# Patient Record
Sex: Female | Born: 1960
Health system: Southern US, Community
[De-identification: ages and names within clinical notes are randomized; demographics above are authoritative.]

## PROBLEM LIST (undated history)

## (undated) DIAGNOSIS — D649 Anemia, unspecified: Secondary | ICD-10-CM

## (undated) DIAGNOSIS — G8929 Other chronic pain: Secondary | ICD-10-CM

## (undated) DIAGNOSIS — K219 Gastro-esophageal reflux disease without esophagitis: Secondary | ICD-10-CM

## (undated) DIAGNOSIS — R12 Heartburn: Secondary | ICD-10-CM

## (undated) DIAGNOSIS — Z8719 Personal history of other diseases of the digestive system: Secondary | ICD-10-CM

## (undated) DIAGNOSIS — E785 Hyperlipidemia, unspecified: Secondary | ICD-10-CM

## (undated) DIAGNOSIS — M25562 Pain in left knee: Secondary | ICD-10-CM

## (undated) DIAGNOSIS — F1010 Alcohol abuse, uncomplicated: Secondary | ICD-10-CM

## (undated) DIAGNOSIS — M25511 Pain in right shoulder: Secondary | ICD-10-CM

## (undated) DIAGNOSIS — G5601 Carpal tunnel syndrome, right upper limb: Secondary | ICD-10-CM

## (undated) DIAGNOSIS — M545 Low back pain, unspecified: Secondary | ICD-10-CM

## (undated) DIAGNOSIS — R51 Headache: Secondary | ICD-10-CM

## (undated) DIAGNOSIS — M1990 Unspecified osteoarthritis, unspecified site: Secondary | ICD-10-CM

## (undated) DIAGNOSIS — M25512 Pain in left shoulder: Secondary | ICD-10-CM

## (undated) DIAGNOSIS — I10 Essential (primary) hypertension: Secondary | ICD-10-CM

## (undated) HISTORY — DX: Essential (primary) hypertension: I10

## (undated) HISTORY — PX: ANTERIOR CRUCIATE LIGAMENT REPAIR: SHX115

## (undated) HISTORY — DX: Pain in right shoulder: M25.511

## (undated) HISTORY — DX: Pain in left knee: M25.562

## (undated) HISTORY — DX: Alcohol abuse, uncomplicated: F10.10

## (undated) HISTORY — DX: Carpal tunnel syndrome, right upper limb: G56.01

## (undated) HISTORY — DX: Anemia, unspecified: D64.9

## (undated) HISTORY — DX: Hyperlipidemia, unspecified: E78.5

## (undated) HISTORY — DX: Headache: R51

## (undated) HISTORY — PX: SHOULDER ARTHROSCOPY W/ ROTATOR CUFF REPAIR: SHX2400

## (undated) HISTORY — DX: Pain in left shoulder: M25.512

## (undated) HISTORY — DX: Other chronic pain: G89.29

## (undated) HISTORY — DX: Low back pain, unspecified: M54.50

## (undated) HISTORY — DX: Low back pain: M54.5

---

## 1987-02-24 HISTORY — PX: TUBAL LIGATION: SHX77

## 1997-10-07 ENCOUNTER — Emergency Department (HOSPITAL_COMMUNITY): Admission: EM | Admit: 1997-10-07 | Discharge: 1997-10-08 | Payer: Self-pay | Admitting: Emergency Medicine

## 1997-12-11 ENCOUNTER — Emergency Department (HOSPITAL_COMMUNITY): Admission: EM | Admit: 1997-12-11 | Discharge: 1997-12-11 | Payer: Self-pay | Admitting: Emergency Medicine

## 1998-06-11 ENCOUNTER — Encounter: Admission: RE | Admit: 1998-06-11 | Discharge: 1998-06-11 | Payer: Self-pay | Admitting: Hematology and Oncology

## 1998-08-02 ENCOUNTER — Encounter: Admission: RE | Admit: 1998-08-02 | Discharge: 1998-08-02 | Payer: Self-pay | Admitting: Internal Medicine

## 1998-11-01 ENCOUNTER — Encounter: Admission: RE | Admit: 1998-11-01 | Discharge: 1998-11-01 | Payer: Self-pay | Admitting: Internal Medicine

## 1999-04-25 ENCOUNTER — Encounter: Admission: RE | Admit: 1999-04-25 | Discharge: 1999-04-25 | Payer: Self-pay | Admitting: Hematology and Oncology

## 1999-04-25 ENCOUNTER — Other Ambulatory Visit: Admission: RE | Admit: 1999-04-25 | Discharge: 1999-04-25 | Payer: Self-pay | Admitting: Obstetrics & Gynecology

## 1999-07-22 ENCOUNTER — Inpatient Hospital Stay (HOSPITAL_COMMUNITY): Admission: AD | Admit: 1999-07-22 | Discharge: 1999-07-24 | Payer: Self-pay

## 1999-09-13 ENCOUNTER — Inpatient Hospital Stay (HOSPITAL_COMMUNITY): Admission: EM | Admit: 1999-09-13 | Discharge: 1999-09-16 | Payer: Self-pay | Admitting: Psychiatry

## 1999-09-26 ENCOUNTER — Encounter: Admission: RE | Admit: 1999-09-26 | Discharge: 1999-09-26 | Payer: Self-pay | Admitting: Internal Medicine

## 1999-09-30 ENCOUNTER — Encounter: Admission: RE | Admit: 1999-09-30 | Discharge: 1999-09-30 | Payer: Self-pay | Admitting: Internal Medicine

## 1999-10-03 ENCOUNTER — Encounter: Payer: Self-pay | Admitting: Internal Medicine

## 1999-10-03 ENCOUNTER — Ambulatory Visit (HOSPITAL_COMMUNITY): Admission: RE | Admit: 1999-10-03 | Discharge: 1999-10-03 | Payer: Self-pay | Admitting: Internal Medicine

## 1999-10-24 ENCOUNTER — Encounter: Admission: RE | Admit: 1999-10-24 | Discharge: 1999-10-24 | Payer: Self-pay | Admitting: Internal Medicine

## 1999-11-17 ENCOUNTER — Encounter: Admission: RE | Admit: 1999-11-17 | Discharge: 1999-11-17 | Payer: Self-pay | Admitting: Internal Medicine

## 1999-12-31 ENCOUNTER — Encounter: Admission: RE | Admit: 1999-12-31 | Discharge: 1999-12-31 | Payer: Self-pay | Admitting: Hematology and Oncology

## 2000-06-22 ENCOUNTER — Encounter: Admission: RE | Admit: 2000-06-22 | Discharge: 2000-06-22 | Payer: Self-pay | Admitting: Internal Medicine

## 2000-06-22 ENCOUNTER — Encounter: Payer: Self-pay | Admitting: Internal Medicine

## 2000-06-22 ENCOUNTER — Ambulatory Visit (HOSPITAL_COMMUNITY): Admission: RE | Admit: 2000-06-22 | Discharge: 2000-06-22 | Payer: Self-pay | Admitting: Internal Medicine

## 2000-07-13 ENCOUNTER — Encounter: Admission: RE | Admit: 2000-07-13 | Discharge: 2000-07-13 | Payer: Self-pay | Admitting: Internal Medicine

## 2000-07-28 ENCOUNTER — Encounter: Admission: RE | Admit: 2000-07-28 | Discharge: 2000-07-28 | Payer: Self-pay | Admitting: Internal Medicine

## 2000-12-08 ENCOUNTER — Encounter: Admission: RE | Admit: 2000-12-08 | Discharge: 2000-12-08 | Payer: Self-pay

## 2000-12-17 ENCOUNTER — Encounter: Admission: RE | Admit: 2000-12-17 | Discharge: 2000-12-17 | Payer: Self-pay | Admitting: Internal Medicine

## 2001-04-26 ENCOUNTER — Encounter: Admission: RE | Admit: 2001-04-26 | Discharge: 2001-04-26 | Payer: Self-pay | Admitting: Internal Medicine

## 2001-07-21 ENCOUNTER — Encounter: Admission: RE | Admit: 2001-07-21 | Discharge: 2001-07-21 | Payer: Self-pay | Admitting: Internal Medicine

## 2001-09-20 ENCOUNTER — Emergency Department (HOSPITAL_COMMUNITY): Admission: EM | Admit: 2001-09-20 | Discharge: 2001-09-20 | Payer: Self-pay | Admitting: Emergency Medicine

## 2001-09-26 ENCOUNTER — Encounter: Admission: RE | Admit: 2001-09-26 | Discharge: 2001-09-26 | Payer: Self-pay | Admitting: Internal Medicine

## 2001-10-12 ENCOUNTER — Encounter: Admission: RE | Admit: 2001-10-12 | Discharge: 2001-10-12 | Payer: Self-pay | Admitting: Internal Medicine

## 2001-10-18 ENCOUNTER — Ambulatory Visit (HOSPITAL_COMMUNITY): Admission: RE | Admit: 2001-10-18 | Discharge: 2001-10-18 | Payer: Self-pay | Admitting: Internal Medicine

## 2001-10-18 ENCOUNTER — Encounter: Payer: Self-pay | Admitting: Internal Medicine

## 2001-10-26 ENCOUNTER — Encounter: Admission: RE | Admit: 2001-10-26 | Discharge: 2001-10-26 | Payer: Self-pay | Admitting: Internal Medicine

## 2002-09-12 ENCOUNTER — Emergency Department (HOSPITAL_COMMUNITY): Admission: EM | Admit: 2002-09-12 | Discharge: 2002-09-12 | Payer: Self-pay

## 2002-12-07 ENCOUNTER — Encounter: Admission: RE | Admit: 2002-12-07 | Discharge: 2002-12-07 | Payer: Self-pay | Admitting: Internal Medicine

## 2003-04-02 ENCOUNTER — Encounter: Admission: RE | Admit: 2003-04-02 | Discharge: 2003-04-02 | Payer: Self-pay | Admitting: Internal Medicine

## 2003-06-06 ENCOUNTER — Encounter: Admission: RE | Admit: 2003-06-06 | Discharge: 2003-06-06 | Payer: Self-pay | Admitting: Internal Medicine

## 2004-02-16 ENCOUNTER — Emergency Department (HOSPITAL_COMMUNITY): Admission: EM | Admit: 2004-02-16 | Discharge: 2004-02-16 | Payer: Self-pay | Admitting: *Deleted

## 2004-02-21 ENCOUNTER — Ambulatory Visit (HOSPITAL_COMMUNITY): Admission: RE | Admit: 2004-02-21 | Discharge: 2004-02-21 | Payer: Self-pay | Admitting: Internal Medicine

## 2004-02-21 ENCOUNTER — Ambulatory Visit: Payer: Self-pay | Admitting: Internal Medicine

## 2004-03-25 ENCOUNTER — Ambulatory Visit: Payer: Self-pay | Admitting: Internal Medicine

## 2004-03-27 ENCOUNTER — Encounter: Admission: RE | Admit: 2004-03-27 | Discharge: 2004-06-25 | Payer: Self-pay | Admitting: Internal Medicine

## 2004-07-04 ENCOUNTER — Ambulatory Visit: Payer: Self-pay | Admitting: Internal Medicine

## 2004-07-09 ENCOUNTER — Ambulatory Visit (HOSPITAL_COMMUNITY): Admission: RE | Admit: 2004-07-09 | Discharge: 2004-07-09 | Payer: Self-pay | Admitting: Internal Medicine

## 2004-07-11 ENCOUNTER — Ambulatory Visit: Payer: Self-pay | Admitting: Internal Medicine

## 2004-09-26 ENCOUNTER — Emergency Department (HOSPITAL_COMMUNITY): Admission: EM | Admit: 2004-09-26 | Discharge: 2004-09-26 | Payer: Self-pay | Admitting: Emergency Medicine

## 2004-09-30 ENCOUNTER — Ambulatory Visit: Payer: Self-pay | Admitting: Internal Medicine

## 2004-10-12 ENCOUNTER — Ambulatory Visit (HOSPITAL_COMMUNITY): Admission: RE | Admit: 2004-10-12 | Discharge: 2004-10-12 | Payer: Self-pay | Admitting: Orthopedic Surgery

## 2004-10-15 ENCOUNTER — Ambulatory Visit: Payer: Self-pay | Admitting: Internal Medicine

## 2004-11-06 ENCOUNTER — Ambulatory Visit (HOSPITAL_COMMUNITY): Admission: RE | Admit: 2004-11-06 | Discharge: 2004-11-06 | Payer: Self-pay | Admitting: Internal Medicine

## 2004-11-08 ENCOUNTER — Emergency Department (HOSPITAL_COMMUNITY): Admission: EM | Admit: 2004-11-08 | Discharge: 2004-11-08 | Payer: Self-pay | Admitting: Emergency Medicine

## 2004-11-18 ENCOUNTER — Ambulatory Visit: Payer: Self-pay | Admitting: Internal Medicine

## 2004-11-18 ENCOUNTER — Ambulatory Visit (HOSPITAL_COMMUNITY): Admission: RE | Admit: 2004-11-18 | Discharge: 2004-11-18 | Payer: Self-pay | Admitting: Orthopedic Surgery

## 2004-12-24 ENCOUNTER — Ambulatory Visit: Payer: Self-pay | Admitting: Internal Medicine

## 2005-01-21 ENCOUNTER — Ambulatory Visit (HOSPITAL_BASED_OUTPATIENT_CLINIC_OR_DEPARTMENT_OTHER): Admission: RE | Admit: 2005-01-21 | Discharge: 2005-01-21 | Payer: Self-pay | Admitting: Orthopedic Surgery

## 2005-01-21 ENCOUNTER — Ambulatory Visit (HOSPITAL_COMMUNITY): Admission: RE | Admit: 2005-01-21 | Discharge: 2005-01-21 | Payer: Self-pay | Admitting: Orthopedic Surgery

## 2005-02-10 ENCOUNTER — Ambulatory Visit: Payer: Self-pay | Admitting: Internal Medicine

## 2005-02-10 ENCOUNTER — Encounter: Admission: RE | Admit: 2005-02-10 | Discharge: 2005-05-11 | Payer: Self-pay | Admitting: Orthopedic Surgery

## 2005-03-24 ENCOUNTER — Ambulatory Visit: Payer: Self-pay | Admitting: Hospitalist

## 2005-04-01 ENCOUNTER — Ambulatory Visit (HOSPITAL_BASED_OUTPATIENT_CLINIC_OR_DEPARTMENT_OTHER): Admission: RE | Admit: 2005-04-01 | Discharge: 2005-04-01 | Payer: Self-pay | Admitting: Orthopedic Surgery

## 2005-05-12 ENCOUNTER — Encounter: Admission: RE | Admit: 2005-05-12 | Discharge: 2005-06-23 | Payer: Self-pay | Admitting: Orthopedic Surgery

## 2005-06-05 ENCOUNTER — Ambulatory Visit: Payer: Self-pay | Admitting: Internal Medicine

## 2005-08-10 ENCOUNTER — Ambulatory Visit: Payer: Self-pay | Admitting: Internal Medicine

## 2005-09-22 ENCOUNTER — Ambulatory Visit: Payer: Self-pay | Admitting: Internal Medicine

## 2005-10-14 ENCOUNTER — Encounter: Admission: RE | Admit: 2005-10-14 | Discharge: 2005-10-30 | Payer: Self-pay | Admitting: Orthopedic Surgery

## 2005-10-22 ENCOUNTER — Ambulatory Visit: Payer: Self-pay | Admitting: Internal Medicine

## 2005-11-17 ENCOUNTER — Ambulatory Visit: Payer: Self-pay | Admitting: Internal Medicine

## 2005-11-20 ENCOUNTER — Encounter (INDEPENDENT_AMBULATORY_CARE_PROVIDER_SITE_OTHER): Payer: Self-pay | Admitting: Internal Medicine

## 2005-11-20 DIAGNOSIS — R51 Headache: Secondary | ICD-10-CM

## 2005-11-20 DIAGNOSIS — R519 Headache, unspecified: Secondary | ICD-10-CM | POA: Insufficient documentation

## 2005-11-20 DIAGNOSIS — G5600 Carpal tunnel syndrome, unspecified upper limb: Secondary | ICD-10-CM

## 2005-11-20 DIAGNOSIS — F1010 Alcohol abuse, uncomplicated: Secondary | ICD-10-CM

## 2005-11-20 DIAGNOSIS — N926 Irregular menstruation, unspecified: Secondary | ICD-10-CM | POA: Insufficient documentation

## 2005-11-20 DIAGNOSIS — N939 Abnormal uterine and vaginal bleeding, unspecified: Secondary | ICD-10-CM

## 2005-11-20 DIAGNOSIS — J309 Allergic rhinitis, unspecified: Secondary | ICD-10-CM | POA: Insufficient documentation

## 2005-11-27 ENCOUNTER — Ambulatory Visit (HOSPITAL_COMMUNITY): Admission: RE | Admit: 2005-11-27 | Discharge: 2005-11-27 | Payer: Self-pay | Admitting: Internal Medicine

## 2005-12-23 ENCOUNTER — Ambulatory Visit: Payer: Self-pay | Admitting: Internal Medicine

## 2005-12-23 ENCOUNTER — Encounter (INDEPENDENT_AMBULATORY_CARE_PROVIDER_SITE_OTHER): Payer: Self-pay | Admitting: Pulmonary Disease

## 2005-12-23 ENCOUNTER — Encounter (INDEPENDENT_AMBULATORY_CARE_PROVIDER_SITE_OTHER): Payer: Self-pay | Admitting: Internal Medicine

## 2005-12-23 LAB — CONVERTED CEMR LAB
Candida species: NEGATIVE
Gardnerella vaginalis: NEGATIVE
Pap Smear: NORMAL

## 2006-01-22 ENCOUNTER — Ambulatory Visit: Payer: Self-pay | Admitting: Internal Medicine

## 2006-02-07 DIAGNOSIS — M79609 Pain in unspecified limb: Secondary | ICD-10-CM

## 2006-03-02 DIAGNOSIS — E785 Hyperlipidemia, unspecified: Secondary | ICD-10-CM | POA: Insufficient documentation

## 2006-03-10 ENCOUNTER — Ambulatory Visit: Payer: Self-pay | Admitting: Internal Medicine

## 2006-03-10 ENCOUNTER — Ambulatory Visit (HOSPITAL_COMMUNITY): Admission: RE | Admit: 2006-03-10 | Discharge: 2006-03-10 | Payer: Self-pay | Admitting: Internal Medicine

## 2006-03-10 ENCOUNTER — Encounter (INDEPENDENT_AMBULATORY_CARE_PROVIDER_SITE_OTHER): Payer: Self-pay | Admitting: Internal Medicine

## 2006-03-10 LAB — CONVERTED CEMR LAB
Barbiturate Quant, Ur: NEGATIVE
Creatinine,U: 290.7 mg/dL
Methadone: NEGATIVE

## 2006-04-13 ENCOUNTER — Ambulatory Visit: Payer: Self-pay | Admitting: Internal Medicine

## 2006-04-13 DIAGNOSIS — J069 Acute upper respiratory infection, unspecified: Secondary | ICD-10-CM | POA: Insufficient documentation

## 2006-04-20 ENCOUNTER — Ambulatory Visit: Payer: Self-pay | Admitting: Internal Medicine

## 2006-04-20 ENCOUNTER — Encounter (INDEPENDENT_AMBULATORY_CARE_PROVIDER_SITE_OTHER): Payer: Self-pay | Admitting: Internal Medicine

## 2006-04-20 LAB — CONVERTED CEMR LAB
ALT: 27 units/L (ref 0–35)
AST: 31 units/L (ref 0–37)
Albumin: 4 g/dL (ref 3.5–5.2)
Alkaline Phosphatase: 55 units/L (ref 39–117)
Calcium: 8.6 mg/dL (ref 8.4–10.5)
Cholesterol: 139 mg/dL (ref 0–200)
Creatinine, Ser: 0.62 mg/dL (ref 0.40–1.20)
HCT: 37.4 % (ref 36.0–46.0)
HDL: 56 mg/dL (ref >39)
Hemoglobin: 12.1 g/dL (ref 12.0–15.0)
LDL Cholesterol: 74 mg/dL (ref 0–99)
MCV: 91.9 fL (ref 78.0–100.0)
RBC: 4.07 M/uL (ref 3.87–5.11)
RDW: 14.7 % — ABNORMAL HIGH (ref 11.5–14.0)
Total Protein: 6.9 g/dL (ref 6.0–8.3)
Triglycerides: 46 mg/dL (ref <150)
VLDL: 9 mg/dL (ref 0–40)
WBC: 4.4 10*3/uL (ref 4.0–10.5)

## 2006-04-27 ENCOUNTER — Encounter (INDEPENDENT_AMBULATORY_CARE_PROVIDER_SITE_OTHER): Payer: Self-pay | Admitting: Pulmonary Disease

## 2006-04-27 ENCOUNTER — Ambulatory Visit: Payer: Self-pay | Admitting: Hospitalist

## 2006-04-27 DIAGNOSIS — G8929 Other chronic pain: Secondary | ICD-10-CM

## 2006-04-27 LAB — CONVERTED CEMR LAB
Barbiturate Quant, Ur: NEGATIVE
Marijuana Metabolite: NEGATIVE
Methadone: NEGATIVE
Phencyclidine (PCP): NEGATIVE

## 2006-04-29 ENCOUNTER — Telehealth (INDEPENDENT_AMBULATORY_CARE_PROVIDER_SITE_OTHER): Payer: Self-pay | Admitting: Pulmonary Disease

## 2006-05-10 ENCOUNTER — Telehealth: Payer: Self-pay | Admitting: *Deleted

## 2006-05-17 ENCOUNTER — Telehealth: Payer: Self-pay | Admitting: *Deleted

## 2006-05-26 ENCOUNTER — Ambulatory Visit: Payer: Self-pay | Admitting: Internal Medicine

## 2006-06-17 ENCOUNTER — Ambulatory Visit: Payer: Self-pay | Admitting: Internal Medicine

## 2006-06-30 ENCOUNTER — Encounter (INDEPENDENT_AMBULATORY_CARE_PROVIDER_SITE_OTHER): Payer: Self-pay | Admitting: Internal Medicine

## 2006-07-15 ENCOUNTER — Telehealth (INDEPENDENT_AMBULATORY_CARE_PROVIDER_SITE_OTHER): Payer: Self-pay | Admitting: *Deleted

## 2006-08-10 ENCOUNTER — Ambulatory Visit: Payer: Self-pay | Admitting: Internal Medicine

## 2006-09-01 ENCOUNTER — Telehealth: Payer: Self-pay | Admitting: *Deleted

## 2006-09-16 ENCOUNTER — Ambulatory Visit: Payer: Self-pay | Admitting: *Deleted

## 2006-10-04 ENCOUNTER — Telehealth: Payer: Self-pay | Admitting: *Deleted

## 2006-11-05 ENCOUNTER — Ambulatory Visit: Payer: Self-pay | Admitting: Internal Medicine

## 2006-12-03 ENCOUNTER — Telehealth (INDEPENDENT_AMBULATORY_CARE_PROVIDER_SITE_OTHER): Payer: Self-pay | Admitting: Internal Medicine

## 2006-12-06 ENCOUNTER — Encounter (INDEPENDENT_AMBULATORY_CARE_PROVIDER_SITE_OTHER): Payer: Self-pay | Admitting: Internal Medicine

## 2006-12-06 ENCOUNTER — Emergency Department (HOSPITAL_COMMUNITY): Admission: EM | Admit: 2006-12-06 | Discharge: 2006-12-06 | Payer: Self-pay | Admitting: Emergency Medicine

## 2006-12-16 ENCOUNTER — Telehealth: Payer: Self-pay | Admitting: *Deleted

## 2006-12-16 ENCOUNTER — Ambulatory Visit: Payer: Self-pay | Admitting: *Deleted

## 2006-12-17 ENCOUNTER — Ambulatory Visit (HOSPITAL_COMMUNITY): Admission: RE | Admit: 2006-12-17 | Discharge: 2006-12-17 | Payer: Self-pay | Admitting: *Deleted

## 2006-12-23 ENCOUNTER — Encounter (INDEPENDENT_AMBULATORY_CARE_PROVIDER_SITE_OTHER): Payer: Self-pay | Admitting: *Deleted

## 2006-12-23 ENCOUNTER — Encounter (INDEPENDENT_AMBULATORY_CARE_PROVIDER_SITE_OTHER): Payer: Self-pay | Admitting: Internal Medicine

## 2006-12-23 ENCOUNTER — Ambulatory Visit: Payer: Self-pay | Admitting: Internal Medicine

## 2007-01-18 ENCOUNTER — Telehealth: Payer: Self-pay | Admitting: *Deleted

## 2007-01-28 ENCOUNTER — Telehealth (INDEPENDENT_AMBULATORY_CARE_PROVIDER_SITE_OTHER): Payer: Self-pay | Admitting: Internal Medicine

## 2007-01-31 ENCOUNTER — Telehealth (INDEPENDENT_AMBULATORY_CARE_PROVIDER_SITE_OTHER): Payer: Self-pay | Admitting: Internal Medicine

## 2007-03-02 ENCOUNTER — Encounter (INDEPENDENT_AMBULATORY_CARE_PROVIDER_SITE_OTHER): Payer: Self-pay | Admitting: Internal Medicine

## 2007-03-02 ENCOUNTER — Ambulatory Visit: Payer: Self-pay | Admitting: Internal Medicine

## 2007-03-02 DIAGNOSIS — M25512 Pain in left shoulder: Secondary | ICD-10-CM | POA: Insufficient documentation

## 2007-03-03 LAB — CONVERTED CEMR LAB
ALT: 15 units/L (ref 0–35)
Albumin: 4.2 g/dL (ref 3.5–5.2)
CO2: 24 meq/L (ref 19–32)
Calcium: 9.2 mg/dL (ref 8.4–10.5)
Chloride: 103 meq/L (ref 96–112)
Cholesterol: 182 mg/dL (ref 0–200)
Creatinine, Ser: 0.76 mg/dL (ref 0.40–1.20)
Glucose, Bld: 92 mg/dL (ref 70–99)
MCHC: 33.1 g/dL (ref 30.0–36.0)
Platelets: 294 10*3/uL (ref 150–400)
Potassium: 3.8 meq/L (ref 3.5–5.3)
RDW: 13.8 % (ref 11.5–15.5)
Total Protein: 7.3 g/dL (ref 6.0–8.3)
Triglycerides: 53 mg/dL (ref <150)
VLDL: 11 mg/dL (ref 0–40)

## 2007-03-14 ENCOUNTER — Encounter (INDEPENDENT_AMBULATORY_CARE_PROVIDER_SITE_OTHER): Payer: Self-pay | Admitting: Internal Medicine

## 2007-03-28 ENCOUNTER — Telehealth (INDEPENDENT_AMBULATORY_CARE_PROVIDER_SITE_OTHER): Payer: Self-pay | Admitting: Internal Medicine

## 2007-04-25 ENCOUNTER — Telehealth (INDEPENDENT_AMBULATORY_CARE_PROVIDER_SITE_OTHER): Payer: Self-pay | Admitting: Internal Medicine

## 2007-05-18 ENCOUNTER — Ambulatory Visit: Payer: Self-pay | Admitting: Hospitalist

## 2007-05-23 ENCOUNTER — Telehealth (INDEPENDENT_AMBULATORY_CARE_PROVIDER_SITE_OTHER): Payer: Self-pay | Admitting: Internal Medicine

## 2007-06-07 ENCOUNTER — Encounter (INDEPENDENT_AMBULATORY_CARE_PROVIDER_SITE_OTHER): Payer: Self-pay | Admitting: Internal Medicine

## 2007-06-07 ENCOUNTER — Ambulatory Visit: Payer: Self-pay | Admitting: Internal Medicine

## 2007-06-08 LAB — CONVERTED CEMR LAB
Barbiturate Quant, Ur: NEGATIVE
Cocaine Metabolites: NEGATIVE
Marijuana Metabolite: NEGATIVE
Methadone: NEGATIVE
Opiates: NEGATIVE
Phencyclidine (PCP): NEGATIVE

## 2007-06-20 ENCOUNTER — Telehealth (INDEPENDENT_AMBULATORY_CARE_PROVIDER_SITE_OTHER): Payer: Self-pay | Admitting: Internal Medicine

## 2007-06-30 ENCOUNTER — Encounter (INDEPENDENT_AMBULATORY_CARE_PROVIDER_SITE_OTHER): Payer: Self-pay | Admitting: Internal Medicine

## 2007-07-07 ENCOUNTER — Ambulatory Visit: Payer: Self-pay | Admitting: *Deleted

## 2007-07-07 ENCOUNTER — Encounter (INDEPENDENT_AMBULATORY_CARE_PROVIDER_SITE_OTHER): Payer: Self-pay | Admitting: Internal Medicine

## 2007-07-07 LAB — CONVERTED CEMR LAB

## 2007-08-04 ENCOUNTER — Telehealth (INDEPENDENT_AMBULATORY_CARE_PROVIDER_SITE_OTHER): Payer: Self-pay | Admitting: Internal Medicine

## 2007-09-19 ENCOUNTER — Telehealth (INDEPENDENT_AMBULATORY_CARE_PROVIDER_SITE_OTHER): Payer: Self-pay | Admitting: Internal Medicine

## 2007-10-19 ENCOUNTER — Telehealth (INDEPENDENT_AMBULATORY_CARE_PROVIDER_SITE_OTHER): Payer: Self-pay | Admitting: Internal Medicine

## 2007-11-17 ENCOUNTER — Telehealth (INDEPENDENT_AMBULATORY_CARE_PROVIDER_SITE_OTHER): Payer: Self-pay | Admitting: Internal Medicine

## 2007-12-09 ENCOUNTER — Emergency Department (HOSPITAL_COMMUNITY): Admission: EM | Admit: 2007-12-09 | Discharge: 2007-12-09 | Payer: Self-pay | Admitting: Emergency Medicine

## 2007-12-15 ENCOUNTER — Telehealth: Payer: Self-pay | Admitting: *Deleted

## 2008-01-12 ENCOUNTER — Telehealth: Payer: Self-pay | Admitting: Infectious Diseases

## 2008-01-12 ENCOUNTER — Telehealth: Payer: Self-pay | Admitting: *Deleted

## 2008-02-08 ENCOUNTER — Telehealth (INDEPENDENT_AMBULATORY_CARE_PROVIDER_SITE_OTHER): Payer: Self-pay | Admitting: Internal Medicine

## 2008-03-07 ENCOUNTER — Telehealth (INDEPENDENT_AMBULATORY_CARE_PROVIDER_SITE_OTHER): Payer: Self-pay | Admitting: Internal Medicine

## 2008-04-04 ENCOUNTER — Telehealth (INDEPENDENT_AMBULATORY_CARE_PROVIDER_SITE_OTHER): Payer: Self-pay | Admitting: Internal Medicine

## 2008-05-03 ENCOUNTER — Encounter (INDEPENDENT_AMBULATORY_CARE_PROVIDER_SITE_OTHER): Payer: Self-pay | Admitting: Internal Medicine

## 2008-05-03 ENCOUNTER — Ambulatory Visit: Payer: Self-pay | Admitting: Internal Medicine

## 2008-05-03 ENCOUNTER — Telehealth (INDEPENDENT_AMBULATORY_CARE_PROVIDER_SITE_OTHER): Payer: Self-pay | Admitting: Internal Medicine

## 2008-05-03 LAB — CONVERTED CEMR LAB
Alkaline Phosphatase: 67 units/L (ref 39–117)
Calcium: 8.6 mg/dL (ref 8.4–10.5)
Creatinine, Ser: 0.7 mg/dL (ref 0.40–1.20)
Hemoglobin: 12.1 g/dL (ref 12.0–15.0)
Potassium: 3.5 meq/L (ref 3.5–5.3)
RDW: 13.5 % (ref 11.5–15.5)
Sodium: 141 meq/L (ref 135–145)
Total Bilirubin: 0.2 mg/dL — ABNORMAL LOW (ref 0.3–1.2)
Total Protein: 6.9 g/dL (ref 6.0–8.3)
WBC: 4.6 10*3/uL (ref 4.0–10.5)

## 2008-08-12 ENCOUNTER — Emergency Department (HOSPITAL_COMMUNITY): Admission: EM | Admit: 2008-08-12 | Discharge: 2008-08-12 | Payer: Self-pay | Admitting: Emergency Medicine

## 2008-08-15 ENCOUNTER — Ambulatory Visit (HOSPITAL_COMMUNITY): Admission: RE | Admit: 2008-08-15 | Discharge: 2008-08-15 | Payer: Self-pay | Admitting: Internal Medicine

## 2008-08-15 ENCOUNTER — Ambulatory Visit: Payer: Self-pay | Admitting: Internal Medicine

## 2008-09-11 ENCOUNTER — Telehealth (INDEPENDENT_AMBULATORY_CARE_PROVIDER_SITE_OTHER): Payer: Self-pay | Admitting: Internal Medicine

## 2008-09-13 ENCOUNTER — Ambulatory Visit: Payer: Self-pay | Admitting: Internal Medicine

## 2008-09-20 ENCOUNTER — Ambulatory Visit (HOSPITAL_COMMUNITY): Admission: RE | Admit: 2008-09-20 | Discharge: 2008-09-20 | Payer: Self-pay | Admitting: Internal Medicine

## 2008-09-20 LAB — HM MAMMOGRAPHY: HM Mammogram: NEGATIVE

## 2008-10-03 ENCOUNTER — Telehealth (INDEPENDENT_AMBULATORY_CARE_PROVIDER_SITE_OTHER): Payer: Self-pay | Admitting: Internal Medicine

## 2008-10-15 ENCOUNTER — Ambulatory Visit: Payer: Self-pay | Admitting: Internal Medicine

## 2008-10-16 ENCOUNTER — Encounter (INDEPENDENT_AMBULATORY_CARE_PROVIDER_SITE_OTHER): Payer: Self-pay | Admitting: Internal Medicine

## 2008-11-12 ENCOUNTER — Telehealth (INDEPENDENT_AMBULATORY_CARE_PROVIDER_SITE_OTHER): Payer: Self-pay | Admitting: Internal Medicine

## 2008-11-13 ENCOUNTER — Telehealth (INDEPENDENT_AMBULATORY_CARE_PROVIDER_SITE_OTHER): Payer: Self-pay | Admitting: Internal Medicine

## 2008-11-21 ENCOUNTER — Ambulatory Visit: Payer: Self-pay | Admitting: Infectious Diseases

## 2008-11-21 DIAGNOSIS — F4321 Adjustment disorder with depressed mood: Secondary | ICD-10-CM

## 2008-11-21 LAB — CONVERTED CEMR LAB
Calcium: 9.3 mg/dL (ref 8.4–10.5)
Chloride: 104 meq/L (ref 96–112)
Creatinine, Ser: 0.73 mg/dL (ref 0.40–1.20)
Glucose, Bld: 86 mg/dL (ref 70–99)
HDL: 56 mg/dL (ref >39)

## 2008-12-10 ENCOUNTER — Telehealth (INDEPENDENT_AMBULATORY_CARE_PROVIDER_SITE_OTHER): Payer: Self-pay | Admitting: Internal Medicine

## 2009-01-02 ENCOUNTER — Telehealth (INDEPENDENT_AMBULATORY_CARE_PROVIDER_SITE_OTHER): Payer: Self-pay | Admitting: Internal Medicine

## 2009-01-23 ENCOUNTER — Inpatient Hospital Stay (HOSPITAL_COMMUNITY): Admission: EM | Admit: 2009-01-23 | Discharge: 2009-01-24 | Payer: Self-pay | Admitting: Emergency Medicine

## 2009-01-23 ENCOUNTER — Encounter (INDEPENDENT_AMBULATORY_CARE_PROVIDER_SITE_OTHER): Payer: Self-pay | Admitting: Internal Medicine

## 2009-01-23 ENCOUNTER — Ambulatory Visit: Payer: Self-pay | Admitting: Infectious Diseases

## 2009-01-23 ENCOUNTER — Ambulatory Visit: Payer: Self-pay | Admitting: Cardiology

## 2009-01-24 ENCOUNTER — Encounter: Payer: Self-pay | Admitting: Internal Medicine

## 2009-01-29 ENCOUNTER — Telehealth (INDEPENDENT_AMBULATORY_CARE_PROVIDER_SITE_OTHER): Payer: Self-pay | Admitting: *Deleted

## 2009-02-27 ENCOUNTER — Telehealth (INDEPENDENT_AMBULATORY_CARE_PROVIDER_SITE_OTHER): Payer: Self-pay | Admitting: Internal Medicine

## 2009-03-28 ENCOUNTER — Telehealth (INDEPENDENT_AMBULATORY_CARE_PROVIDER_SITE_OTHER): Payer: Self-pay | Admitting: Internal Medicine

## 2009-04-09 ENCOUNTER — Telehealth (INDEPENDENT_AMBULATORY_CARE_PROVIDER_SITE_OTHER): Payer: Self-pay | Admitting: Internal Medicine

## 2009-04-18 ENCOUNTER — Ambulatory Visit: Payer: Self-pay | Admitting: Internal Medicine

## 2009-04-18 LAB — CONVERTED CEMR LAB
Alkaline Phosphatase: 55 units/L (ref 39–117)
BUN: 14 mg/dL (ref 6–23)
CO2: 28 meq/L (ref 19–32)
Glucose, Bld: 84 mg/dL (ref 70–99)
Total Bilirubin: 0.3 mg/dL (ref 0.3–1.2)
Total Protein: 7.3 g/dL (ref 6.0–8.3)

## 2009-04-19 ENCOUNTER — Telehealth (INDEPENDENT_AMBULATORY_CARE_PROVIDER_SITE_OTHER): Payer: Self-pay | Admitting: Internal Medicine

## 2009-04-26 ENCOUNTER — Ambulatory Visit: Payer: Self-pay | Admitting: Internal Medicine

## 2009-05-06 ENCOUNTER — Telehealth (INDEPENDENT_AMBULATORY_CARE_PROVIDER_SITE_OTHER): Payer: Self-pay | Admitting: Internal Medicine

## 2009-05-16 ENCOUNTER — Telehealth (INDEPENDENT_AMBULATORY_CARE_PROVIDER_SITE_OTHER): Payer: Self-pay | Admitting: Internal Medicine

## 2009-05-27 ENCOUNTER — Ambulatory Visit: Payer: Self-pay | Admitting: Internal Medicine

## 2010-02-18 ENCOUNTER — Emergency Department (HOSPITAL_COMMUNITY)
Admission: EM | Admit: 2010-02-18 | Discharge: 2010-02-18 | Payer: Self-pay | Source: Home / Self Care | Admitting: Family Medicine

## 2010-03-14 ENCOUNTER — Ambulatory Visit: Admit: 2010-03-14 | Payer: Self-pay

## 2010-03-16 ENCOUNTER — Encounter: Payer: Self-pay | Admitting: Infectious Disease

## 2010-03-16 ENCOUNTER — Encounter: Payer: Self-pay | Admitting: Internal Medicine

## 2010-03-21 ENCOUNTER — Emergency Department (HOSPITAL_COMMUNITY)
Admission: EM | Admit: 2010-03-21 | Discharge: 2010-03-21 | Payer: Self-pay | Source: Home / Self Care | Admitting: Emergency Medicine

## 2010-03-26 ENCOUNTER — Inpatient Hospital Stay (HOSPITAL_COMMUNITY)
Admission: RE | Admit: 2010-03-26 | Discharge: 2010-03-26 | Disposition: A | Discharge status: Home / Self Care | Payer: BC Managed Care – PPO | Source: Ambulatory Visit | Attending: Family Medicine | Admitting: Family Medicine

## 2010-03-26 DIAGNOSIS — IMO0001 Reserved for inherently not codable concepts without codable children: Secondary | ICD-10-CM

## 2010-03-27 ENCOUNTER — Ambulatory Visit (INDEPENDENT_AMBULATORY_CARE_PROVIDER_SITE_OTHER): Payer: BC Managed Care – PPO | Admitting: Internal Medicine

## 2010-03-27 ENCOUNTER — Encounter: Payer: Self-pay | Admitting: Internal Medicine

## 2010-03-27 DIAGNOSIS — Z1239 Encounter for other screening for malignant neoplasm of breast: Secondary | ICD-10-CM

## 2010-03-27 DIAGNOSIS — M25511 Pain in right shoulder: Secondary | ICD-10-CM

## 2010-03-27 DIAGNOSIS — Z124 Encounter for screening for malignant neoplasm of cervix: Secondary | ICD-10-CM

## 2010-03-27 DIAGNOSIS — Z0000 Encounter for general adult medical examination without abnormal findings: Secondary | ICD-10-CM | POA: Insufficient documentation

## 2010-03-27 DIAGNOSIS — Z23 Encounter for immunization: Secondary | ICD-10-CM

## 2010-03-27 DIAGNOSIS — M25519 Pain in unspecified shoulder: Secondary | ICD-10-CM

## 2010-03-27 MED ORDER — IBUPROFEN 800 MG PO TABS: 800.0000 mg | ORAL_TABLET | Freq: Four times a day (QID) | ORAL | Status: DC | PRN

## 2010-03-27 MED ORDER — HYDROCHLOROTHIAZIDE 25 MG PO TABS: 25.0000 mg | ORAL_TABLET | Freq: Every day | ORAL | Status: DC

## 2010-03-27 MED ORDER — METHOCARBAMOL 500 MG PO TABS: 500.0000 mg | ORAL_TABLET | Freq: Four times a day (QID) | ORAL | Status: DC
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
## 2010-03-27 NOTE — Progress Notes (Signed)
Summary: phone/gg  Phone Note Call from Patient   Summary of Call: Pt c/o pain to both legs.  She was seen  on 3/4 to address pain medications and left clinic angry.  Today she calls back and wants a pain med called in.  She will not take tylenol/ibu together, as prescribed. She has an appointment with you on 4/4.  Can you give her something until then or please call her at [phone] Initial call taken by: Merrie Roof RN,  May 06, 2009 11:43 AM  Follow-up for Phone Call        I have discussed Ms. Pickering's pain management with Dr. Josem Kaufmann on last appt with me. THen the paln was to try meloxicam and capsaicin clream if she doesnot tolerate high dose tylenol. Will prescribe these for now and f/u in appt.  Follow-up by: Jason Coop MD,  May 06, 2009 1:58 PM    New/Updated Medications: MELOXICAM 15 MG TABS (MELOXICAM) take 1 tab by mouth daily after food. CAPSAICIN 0.025 % CREA (CAPSAICIN) apply to the affected area with a glove 3-4 times a day as needed for pain. Prescriptions: CAPSAICIN 0.025 % CREA (CAPSAICIN) apply to the affected area with a glove 3-4 times a day as needed for pain.  #1 x 2   Entered and Authorized by:   Jason Coop MD   Signed by:   Jason Coop MD on 05/06/2009   Method used:   Electronically to        [facility] [address] (retail)       [phone]       [place], [place]  [address]       Ph: [phone]       Fax: [phone]   RxID:   [id number] MELOXICAM 15 MG TABS (MELOXICAM) take 1 tab by mouth daily after food.  #30 x 0   Entered and Authorized by:   Jason Coop MD   Signed by:   Jason Coop MD on 05/06/2009   Method used:   Electronically to        [facility] [address] [place] (retail)       [phone]       [place], [place]  [address]       Ph: [phone]       Fax: [phone]   RxID:   [id number]   Appended Document:  phone/gg pt informed and verbalizes understanding of these instructions.

## 2010-03-27 NOTE — Progress Notes (Signed)
Summary: refill/gg  Phone Note Refill Request  on April 09, 2009 10:00 AM  Refills Requested: Medication #1:  VICODIN 5-500 MG  TABS Take 1 tablet by mouth four times a day as needed for pain   Last Refilled: 02/27/2009 Pt has a scheduled appointment with you on 2/24  can you give her enough to last until then?? Pt # [id number]   Method Requested: Telephone to Pharmacy Initial call taken by: Merrie Roof RN,  April 09, 2009 10:00 AM    Prescriptions: VICODIN 5-500 MG  TABS (HYDROCODONE-ACETAMINOPHEN) Take 1 tablet by mouth four times a day as needed for pain  #20 x 0   Entered and Authorized by:   Jason Coop MD   Signed by:   Jason Coop MD on 04/09/2009   Method used:   Telephoned to ...       [facility] [address] (retail)       [phone]       [place], [place]  [address]       Ph: [phone]       Fax: [phone]   RxID:   [id number]   Appended Document: refill/gg Rx phoned in and pt informed

## 2010-03-27 NOTE — Assessment & Plan Note (Signed)
Summary: F/U/EST/VS   Vital Signs:  Patient profile:   50 year old female Height:      59 inches (149.86 cm) Weight:      136.3 pounds (61.95 kg) BMI:     27.63 Temp:     98.7 degrees F Pulse rate:   89 / minute BP sitting:   120 / 79  (right arm) Cuff size:   regular  Vitals Entered By: Dorie Rank RN (May 27, 2009 4:25 PM) CC: needs med refill - pain med and ? if need iron Is Patient Diabetic? No Pain Assessment Patient in pain? yes     Location: right shoulder Intensity: 5 Type: sharp and throbs at times Onset of pain  chronic - also nights right knee  and whole leg and left knee also chronic since 2006-7 - back pain stabbing - comes and goes Nutritional Status BMI of 25 - 29 = overweight  Have you ever been in a relationship where you felt threatened, hurt or afraid?No   Does patient need assistance? Functional Status Self care Ambulation Normal   Primary Care Provider:  Carlus Pavlov MD  CC:  needs med refill - pain med and ? if need iron.  History of Present Illness: Toni Gutierrez is a 50 yo lady with PMH as outlined in the EMR comes today for a f/u visit.   1. HTN: She takes her HCTZ without any problem.   2. Chronic pain: She used meloxicam and capsaicin cream off/on for a week. The pain did not get any better. She started to have some stomach problem with meloxicam and now she is not taking it. She is also not using capsaicin cream. The pain is especially present when she stands at work and is not present when she is not working. She is trying to get her paper work with [employer] so that she can see back her Careers adviser.   Preventive Screening-Counseling & Management  Alcohol-Tobacco     Alcohol drinks/day: 0     Smoking Status: never  Caffeine-Diet-Exercise     Does Patient Exercise: yes     Type of exercise: WALKING     Exercise (avg: min/session): 4     Times/week: 30  Current Medications (verified): 1)  Hydrochlorothiazide 25 Mg Tabs  (Hydrochlorothiazide) .... Take 1 Tablet By Mouth Once A Day 2)  Triamcinolone Acetonide 0.1 %  Crea (Triamcinolone Acetonide) .... Apply Two Times A Day To Underarms For 1 Week 3)  K-Lor 20 Meq Pack (Potassium Chloride) .... Take 1 Pill By Mouth On Alternate Days 4)  Meloxicam 15 Mg Tabs (Meloxicam) .... Take 1 Tab By Mouth Daily After Food. 5)  Capsaicin 0.025 % Crea (Capsaicin) .... Apply To The Affected Area With A Glove 3-4 Times A Day As Needed For Pain.  Allergies: No Known Drug Allergies  Review of Systems      See HPI  Physical Exam  Mouth:  pharynx pink and moist.   Lungs:  normal breath sounds, no crackles, and no wheezes.   Heart:  normal rate, regular rhythm, no murmur, no gallop, and no rub.   Msk:  b/l knee: no swelling or tenderness.    Impression & Recommendations:  Problem # 1:  PAIN, CHRONIC NEC (ICD-338.29) See HPI and my note from 10/15/08 and scanned documents received from Dr. Sherlean Foot on 10/16/08. The pain was persistent after meloxicam and capsaicin cream. Her pain also persisted after taking high dose scheduled tylenol previously. She was taking  vicodin previously and we stopped it for the past few months and ideally trying to treat the cause and less ideally treat with non-narcotics. Her knees pain is worse at the end of a day when she is working and is not present when she is not working. Spoke with Dr. Josem Kaufmann and if she could use a cane to relieve some weight away from her knee, this may help. She will try to use a cane. Knees exam is benign. We have not checked knee x-ray since 2006, will check b/l knee x-ray. She is working on to see Dr. Sherlean Foot, her orthopedic surgeon, but currently she has financial problem, so will refer to sports medicine to see if anything can be offered now. Please note that pt had 1 steroid injection on her right shoulder for what seems like bursitis/tendinitis in 2006 and she is s/p ACL reconstruction for L. Knee. I spent an hour in the care for  Toni Gutierrez today.   Problem # 2:  HYPERTENSION (ICD-401.9) BP controlled with HCTZ. Cont same.  Her updated medication list for this problem includes:    Hydrochlorothiazide 25 Mg Tabs (Hydrochlorothiazide) .Marland Kitchen... Take 1 tablet by mouth once a day  BP today: 120/79 Prior BP: 137/83 (04/26/2009)  Labs Reviewed: K+: 3.9 (04/18/2009) Creat: : 0.73 (04/18/2009)   Chol: 191 (11/21/2008)   HDL: 56 (11/21/2008)   LDL: 118 (11/21/2008)   TG: 84 (11/21/2008)  Complete Medication List: 1)  Hydrochlorothiazide 25 Mg Tabs (Hydrochlorothiazide) .... Take 1 tablet by mouth once a day 2)  Triamcinolone Acetonide 0.1 % Crea (Triamcinolone acetonide) .... Apply two times a day to underarms for 1 week 3)  K-lor 20 Meq Pack (Potassium chloride) .... Take 1 pill by mouth on alternate days 4)  Meloxicam 15 Mg Tabs (Meloxicam) .... Take 1 tab by mouth daily after food. 5)  Capsaicin 0.025 % Crea (Capsaicin) .... Apply to the affected area with a glove 3-4 times a day as needed for pain.  Other Orders: Sports Medicine (Sports Med) Physical Therapy Referral (PT) Diagnostic X-Ray/Fluoroscopy (Diagnostic X-Ray/Flu)  Patient Instructions: 1)  Please schedule a follow-up appointment in 1 month. 2)  Limit your Sodium (Salt) to less than 2 grams a day(slightly less than 1/2 a teaspoon) to prevent fluid retention, swelling, or worsening of symptoms. 3)  You need to lose weight. Consider a lower calorie diet and regular exercise.   Prevention & Chronic Care Immunizations   Influenza vaccine: Fluvax 3+  (11/21/2008)   Influenza vaccine deferral: Deferred  (05/27/2009)   Influenza vaccine due: 10/24/2008    Tetanus booster: Not documented   Td booster deferral: Deferred  (05/27/2009)    Pneumococcal vaccine: Not documented   Pneumococcal vaccine deferral: Not indicated  (05/27/2009)  Other Screening   Pap smear:  Specimen Adequacy: Satisfactory for evaluation.   Interpretation/Result:Negative for  intraepithelial Lesion or Malignancy.   Location: [facility].    (07/07/2007)   Pap smear action/deferral: GYN referral  (05/27/2009)   Pap smear due: 06/2008    Mammogram: ASSESSMENT: Negative - BI-RADS 1^MS DIGITAL SCREENING  (09/20/2008)   Mammogram action/deferral: Ordered  (09/13/2008)   Smoking status: never  (05/27/2009)  Lipids   Total Cholesterol: 191  (11/21/2008)   Lipid panel action/deferral: Lipid Panel ordered   LDL: 118  (11/21/2008)   LDL Direct: Not documented   HDL: 56  (11/21/2008)   Triglycerides: 84  (11/21/2008)    SGOT (AST): 18  (04/18/2009)   SGPT (ALT): 16  (  04/18/2009)   Alkaline phosphatase: 55  (04/18/2009)   Total bilirubin: 0.3  (04/18/2009)    Lipid flowsheet reviewed?: Yes   Progress toward LDL goal: Unchanged  Hypertension   Last Blood Pressure: 120 / 79  (05/27/2009)   Serum creatinine: 0.73  (04/18/2009)   Serum potassium 3.9  (04/18/2009)    Hypertension flowsheet reviewed?: Yes   Progress toward BP goal: Unchanged  Self-Management Support :   Personal Goals (by the next clinic visit) :      Personal blood pressure goal: 140/90  (09/13/2008)     Personal LDL goal: 130  (09/13/2008)    Patient will work on the following items until the next clinic visit to reach self-care goals:     Medications and monitoring: take my medicines every day, bring all of my medications to every visit  (05/27/2009)     Eating: eat more vegetables, eat foods that are low in salt, eat baked foods instead of fried foods  (05/27/2009)     Activity: park at the far end of the parking lot  (05/27/2009)     Other: walking all day at [employer]  (05/27/2009)    Hypertension self-management support: Written self-care plan, Pre-printed educational material, Resources for patients handout  (05/27/2009)   Hypertension self-care plan printed.    Lipid self-management support: Written self-care plan, Pre-printed educational material, Resources for  patients handout  (05/27/2009)   Lipid self-care plan printed.      Resource handout printed.

## 2010-03-27 NOTE — Progress Notes (Signed)
Summary: refill/gg  Phone Note Refill Request  on March 28, 2009 9:07 AM  Refills Requested: Medication #1:  VICODIN 5-500 MG  TABS Take 1 tablet by mouth four times a day as needed for pain   Last Refilled: 02/27/2009  Method Requested: Telephone to Pharmacy Initial call taken by: Merrie Roof RN,  March 28, 2009 9:08 AM  Follow-up for Phone Call        Rx denied because she needs to be seen in order to know what is the cause of her pain and if she needs any narcotics.  Follow-up by: Jason Coop MD,  March 28, 2009 5:05 PM     Appended Document: refill/gg pt informed

## 2010-03-27 NOTE — Progress Notes (Signed)
Summary: change of meds/ hla  Phone Note Call from Patient   Summary of Call: pt states she has had diarrhea since starting her new pain med, i explained that it is part of what is in vicodin and she has been taking vicodin, possibly she has a gi virus, instructed if symptoms continue or become worse she may go to urg care or er and she may call on mon for an appt if needed, she is agreeable and verb understanding Initial call taken by: Marin Roberts RN,  April 19, 2009 10:24 AM  Follow-up for Phone Call        This is a good plan. Thank you.  Follow-up by: Jason Coop MD,  April 20, 2009 11:02 PM

## 2010-03-27 NOTE — Assessment & Plan Note (Signed)
Summary: EST-LEG PAIN/CFB   Vital Signs:  Patient profile:   50 year old female Height:      59 inches (149.86 cm) Weight:      136.2 pounds (61.73 kg) BMI:     27.53 Temp:     97.3 degrees F (36.28 degrees C) oral Pulse rate:   80 / minute BP sitting:   100 / 80  (left arm) Cuff size:   regular  Vitals Entered By: Theotis Barrio NT II (April 18, 2009 10:54 AM) CC: LEFT LEG PAIN # 5  / STARTING  TO HAVE PAIN IN RIGHT LEG   /  MEDICATION  REFILL Pain Assessment Patient in pain? yes     Location: LEFT LEG PAIN Intensity:       5 Type: THROB Onset of pain  Chronic Nutritional Status BMI of 25 - 29 = overweight  Have you ever been in a relationship where you felt threatened, hurt or afraid?No   Does patient need assistance? Functional Status Self care Ambulation Normal Comments LEFT LEG PAIN # 5 / STARTING TO HAVE PAIN IN RIGHT LEG   / MEDICATION REFILL   Primary Care Provider:  Carlus Pavlov MD  CC:  LEFT LEG PAIN # 5  / STARTING  TO HAVE PAIN IN RIGHT LEG   /  MEDICATION  REFILL.  History of Present Illness: Toni Gutierrez is a 50 yo lady with PMH as outlined in the EMR comes today for a f/u visit.   1. Chronic Pain: SHe is doing exercise. She started to hurt her right knee and this was her good knee. Her left knee is stabe. No injury to right knee. No swelling, fever,  chills. Back is hurting and is stable. No urine and bowel incontinence. Her right shoulder is OK. She is still working at [employer] where she has to stand for the whole day. She is now looking for a different job.   2. Grief reaction: Her son is now improving and is independent. Her nerves is OK. She is not taking xanax now.   3. HTN: She is taking her HCTZ and potassium.     Depression History:      The patient denies a depressed mood most of the day and a diminished interest in her usual daily activities.         Preventive Screening-Counseling & Management  Alcohol-Tobacco     Smoking Status:  never  Caffeine-Diet-Exercise     Does Patient Exercise: yes     Type of exercise: WALKING     Exercise (avg: min/session): 4     Times/week: 30  Current Medications (verified): 1)  Hydrochlorothiazide 25 Mg Tabs (Hydrochlorothiazide) .... Take 1 Tablet By Mouth Once A Day 2)  Triamcinolone Acetonide 0.1 %  Crea (Triamcinolone Acetonide) .... Apply Two Times A Day To Underarms For 1 Week 3)  Acetaminophen 500 Mg Caps (Acetaminophen) .... Take 2 Pill By Mouth 4 Times A Day. 4)  K-Lor 20 Meq Pack (Potassium Chloride) .... Take 1 Pill By Mouth On Alternate Days  Allergies: No Known Drug Allergies  Social History: Does Patient Exercise:  yes  Review of Systems      See HPI  Physical Exam  General:  alert.   Mouth:  pharynx pink and moist.   Lungs:  normal breath sounds, no crackles, and no wheezes.   Heart:  normal rate, regular rhythm, no murmur, and no gallop.   Abdomen:  soft, non-tender, and normal bowel sounds.  Msk:  Spinal exam: There is minimal tenderness over the lower back, no deformity. SLRT negative.   B/L Knee: No redness, swelling, deformity. No tenderness. Ant/Post drawer signs were negative.  Neurologic:  alert & oriented X3.  Both lower limbs has normal strength on both proximal and distal extremity, normal light touch sensation.    Impression & Recommendations:  Problem # 1:  GRIEF REACTION (ICD-309.0) Pt is getting over the grief reaction now. She is no longer using xanax.   Problem # 2:  PAIN, CHRONIC NEC (ICD-338.29) Today again we had a very long discussion regarding what to do with her chronic pain. Spinal and knee exam is benign. Pt had MRI of LS spine in 2006 and it was positive for some degenerative arthritis. She has negative x rays of her knee in 2006. There is no need to repeat scans at this time as exam is normal and symptoms are stable with vicodin and not progressive.  Because of lack of insurance she is not able to see her orthopedic surgeon in  the past. Please see my note from 10/15/08. Plan for now is to refer her to sports medicine and if we can to orthopedics to find out the cause of her pain and if anything definitive can be done regarding it. Referral to pain clinic is also another option. After long discussion with her and Dr. Josem Kaufmann, plan is to change vicodin to acetaminophen 1gm by mouth every 6 hourly scheduled. She had normal liver function on 12/10. Needs to repeat LFT on next visit.  Orders: Sports Medicine (Sports Med)  Problem # 3:  HYPERTENSION (ICD-401.9) BP on the lower side today, will follow.  Labs Reviewed: SGOT: 19 (05/03/2008)   SGPT: 15 (05/03/2008)   HDL:56 (11/21/2008), 52 (03/02/2007)  LDL:118 (11/21/2008), 119 (03/02/2007)  Chol:191 (11/21/2008), 182 (03/02/2007)  Trig:84 (11/21/2008), 53 (03/02/2007)  Her updated medication list for this problem includes:    Hydrochlorothiazide 25 Mg Tabs (Hydrochlorothiazide) .Marland Kitchen... Take 1 tablet by mouth once a day  BP today: 100/80 Prior BP: 138/90 (11/21/2008)  Labs Reviewed: K+: 3.4 (11/21/2008) Creat: : 0.73 (11/21/2008)   Chol: 191 (11/21/2008)   HDL: 56 (11/21/2008)   LDL: 118 (11/21/2008)   TG: 84 (11/21/2008)  Complete Medication List: 1)  Hydrochlorothiazide 25 Mg Tabs (Hydrochlorothiazide) .... Take 1 tablet by mouth once a day 2)  Triamcinolone Acetonide 0.1 % Crea (Triamcinolone acetonide) .... Apply two times a day to underarms for 1 week 3)  Acetaminophen 500 Mg Caps (Acetaminophen) .... Take 2 pill by mouth 4 times a day. 4)  K-lor 20 Meq Pack (Potassium chloride) .... Take 1 pill by mouth on alternate days  Other Orders: T-Comprehensive Metabolic Panel [id number]) T-TSH [id number]) T-T4, Free [phone])  Patient Instructions: 1)  Please schedule a follow-up appointment in 1 month. 2)  It is important that you exercise regularly at least 20 minutes 5 times a week. If you develop chest pain, have severe difficulty breathing, or feel very  tired , stop exercising immediately and seek medical attention. Prescriptions: ACETAMINOPHEN 500 MG CAPS (ACETAMINOPHEN) take 2 pill by mouth 4 times a day.  #120 x 1   Entered and Authorized by:   Jason Coop MD   Signed by:   Jason Coop MD on 04/18/2009   Method used:   Print then Give to Patient   RxID:   [id number] K-LOR 20 MEQ PACK (POTASSIUM CHLORIDE) take 1 pill by mouth on alternate days  #15 x 1   Entered  and Authorized by:   Jason Coop MD   Signed by:   Jason Coop MD on 04/18/2009   Method used:   Print then Give to Patient   RxID:   [id number]  Process Orders Check Orders Results:     [facility]: ABN not required for this insurance Tests Sent for requisitioning (April 20, 2009 11:02 PM):     04/18/2009: [facility] -- T-Comprehensive Metabolic Panel [[id number]] (signed)     04/18/2009: [facility] -- T-TSH [phone] (signed)     04/18/2009: [facility] -- T-T4, [place] [[id number]] (signed)    Prevention & Chronic Care Immunizations   Influenza vaccine: Fluvax 3+  (11/21/2008)   Influenza vaccine due: 10/24/2008    Tetanus booster: Not documented    Pneumococcal vaccine: Not documented  Other Screening   Pap smear:  Specimen Adequacy: Satisfactory for evaluation.   Interpretation/Result:Negative for intraepithelial Lesion or Malignancy.   Location: [facility].    (07/07/2007)   Pap smear due: 06/2008    Mammogram: ASSESSMENT: Negative - BI-RADS 1^MS DIGITAL SCREENING  (09/20/2008)   Mammogram action/deferral: Ordered  (09/13/2008)   Smoking status: never  (04/18/2009)  Lipids   Total Cholesterol: 191  (11/21/2008)   Lipid panel action/deferral: Lipid Panel ordered   LDL: 118  (11/21/2008)   LDL Direct: Not documented   HDL: 56  (11/21/2008)   Triglycerides: 84  (11/21/2008)    SGOT (AST): 19  (05/03/2008)   SGPT (ALT): 15   (05/03/2008) CMP ordered    Alkaline phosphatase: 67  (05/03/2008)   Total bilirubin: 0.2  (05/03/2008)  Hypertension   Last Blood Pressure: 100 / 80  (04/18/2009)   Serum creatinine: 0.73  (11/21/2008)   Serum potassium 3.4  (11/21/2008) CMP ordered   Self-Management Support :   Personal Goals (by the next clinic visit) :      Personal blood pressure goal: 140/90  (09/13/2008)     Personal LDL goal: 130  (09/13/2008)    Patient will work on the following items until the next clinic visit to reach self-care goals:     Medications and monitoring: take my medicines every day, bring all of my medications to every visit  (04/18/2009)     Eating: drink diet soda or water instead of juice or soda, eat more vegetables, use fresh or frozen vegetables, eat baked foods instead of fried foods, eat fruit for snacks and desserts, limit or avoid alcohol  (04/18/2009)     Activity: take a 30 minute walk every day  (04/18/2009)    Hypertension self-management support: Resources for patients handout  (04/18/2009)    Lipid self-management support: Resources for patients handout  (04/18/2009)        Resource handout printed.   Process Orders Check Orders Results:     [facility]: ABN not required for this insurance Tests Sent for requisitioning (April 20, 2009 11:02 PM):     04/18/2009: [facility] -- T-Comprehensive Metabolic Panel [[id number]] (signed)     04/18/2009: [facility] -- T-TSH [phone] (signed)     04/18/2009: [facility] -- T-T4, [place] [[id number]] (signed)     Appended Document: Office Visit - Infectious Disease     Allergies: No Known Drug Allergies   Impression & Recommendations:  Problem # 1:  PAIN, CHRONIC NEC (ICD-338.29) Please note that pt has tried naproxen in the past and she says it didn't help. She also says ibuprofen doesn't help. We can  try meloxicam 15 mg daily, which is under 4  dollars list and also capsaicin cream 0.25% three times a day or qid, if needed in the future for pain management.   Complete Medication List: 1)  Hydrochlorothiazide 25 Mg Tabs (Hydrochlorothiazide) .... Take 1 tablet by mouth once a day 2)  Triamcinolone Acetonide 0.1 % Crea (Triamcinolone acetonide) .... Apply two times a day to underarms for 1 week 3)  Acetaminophen 500 Mg Caps (Acetaminophen) .... Take 2 pill by mouth 4 times a day. 4)  K-lor 20 Meq Pack (Potassium chloride) .... Take 1 pill by mouth on alternate days

## 2010-03-27 NOTE — Assessment & Plan Note (Addendum)
TDAP today.  Mammo referral. GYN for pap - [facility]

## 2010-03-27 NOTE — Patient Instructions (Signed)
You will be referred to Orthopedic surgery for better evaluation of your right shoulder pain. You will be contacted for an appointment date and time. Pls make sure to work closely with Jaynee Eagles and get all required documents on time so we can expedite acquisition of your orange card. In the mean time, please take Ibuprofen, you can take up to 800mg  every 6 hours for pain. Pls take with food to reduce stomach irritations. Let us know if you don't hear back from [place] within the next week concerning your appointment above.

## 2010-03-27 NOTE — Progress Notes (Signed)
Summary: med refill/gp  Phone Note Refill Request Message from:  Fax from Pharmacy on May 16, 2009 1:57 PM  Refills Requested: Medication #1:  HYDROCHLOROTHIAZIDE 25 MG TABS Take 1 tablet by mouth once a day   Last Refilled: 04/03/2009 Last appt. 04/26/09.   Method Requested: Electronic Initial call taken by: Chinita Pester RN,  May 16, 2009 1:57 PM    Prescriptions: HYDROCHLOROTHIAZIDE 25 MG TABS (HYDROCHLOROTHIAZIDE) Take 1 tablet by mouth once a day  #30 x 2   Entered and Authorized by:   Jason Coop MD   Signed by:   Jason Coop MD on 05/16/2009   Method used:   Electronically to        [facility] [address] (retail)       [phone]       [place], [place]  [address]       Ph: [phone]       Fax: [phone]   RxID:   [id number]

## 2010-03-27 NOTE — Progress Notes (Signed)
Summary: Refill/gh  Phone Note Refill Request Message from:  Patient on November 12, 2008 5:00 PM  Refills Requested: Medication #1:  VICODIN 5-500 MG  TABS Take 1 tablet by mouth four times a day as needed for pain. Initial call taken by: Angelina Ok RN,  November 12, 2008 5:00 PM  Follow-up for Phone Call        Pt has an appt with me next wk when we will discuss about the use of narcotics.  Follow-up by: Jason Coop MD,  November 14, 2008 2:21 PM

## 2010-03-27 NOTE — Assessment & Plan Note (Signed)
Unclear etiology. She however does have a h/o rotator cuff injury s/p repair, and on exam, there is a palpable mass/knot around the deltoid. For now, continue Ibuprofen, instructed to take up to a max of 4 times daily with food. Will be referred to Ortho for further evaluation since she's seen them in the past. She was also given a script for Robaxin. Will see how she responds to this.

## 2010-03-27 NOTE — Consult Note (Signed)
Summary: [facility]  SM[facility]   Imported By: Florinda Marker 10/19/2008 16:10:54  _____________________________________________________________________  External Attachment:    Type:   Image     Comment:   External Document

## 2010-03-27 NOTE — Progress Notes (Signed)
Summary: refill/ hla  Phone Note Refill Request Message from:  Patient on February 27, 2009 11:58 AM  Refills Requested: Medication #1:  VICODIN 5-500 MG  TABS Take 1 tablet by mouth four times a day as needed for pain   Last Refilled: 12/8 Initial call taken by: Marin Roberts RN,  February 27, 2009 11:58 AM  Follow-up for Phone Call        Where is Toni Gutierrez's pain? I haven't seen for a while. We need to see her in our office.  Follow-up by: Jason Coop MD,  February 27, 2009 7:06 PM    Prescriptions: VICODIN 5-500 MG  TABS (HYDROCODONE-ACETAMINOPHEN) Take 1 tablet by mouth four times a day as needed for pain  #60 x 0   Entered and Authorized by:   Jason Coop MD   Signed by:   Jason Coop MD on 02/27/2009   Method used:   Telephoned to ...       [facility] [address] [phone]* (retail)       [address]       [place], [place]  [address]       Ph: [phone]       Fax: [phone]   RxID:   [id number]   Appended Document: refill/ hla Pt. states her pain is in her back and knee. And she will call back for an appt.;stated it is not time yet for an appt. but will call back.  Rx called in to [facility] [address]

## 2010-03-27 NOTE — Assessment & Plan Note (Signed)
Summary: new pain med too strong, upsetting stomach/pcp-pokharel/hla   Vital Signs:  Patient profile:   50 year old female Height:      59 inches (149.86 cm) Weight:      134.7 pounds (61.23 kg) BMI:     27.30 Temp:     98.4 degrees F (36.89 degrees C) oral Pulse rate:   78 / minute BP sitting:   137 / 83  (left arm)  Vitals Entered By: Chinita Pester RN (April 26, 2009 10:35 AM) CC: Pain med. too strong- upsets her stomach and makes her sleepy. Pain Assessment Patient in pain? yes     Location: left leg/hip Intensity: 5 Type: aching Onset of pain  Intermittent Nutritional Status BMI of 25 - 29 = overweight  Does patient need assistance? Functional Status Self care Ambulation Normal   Primary Care Provider:  Carlus Pavlov MD  CC:  Pain med. too strong- upsets her stomach and makes her sleepy.Marland Kitchen  History of Present Illness: [name] is a 50 yo F with h/o chronic pain (low back, L knee) with negative imaging who presents for follow-up of knee pain. She was last seen by Dr. Aleene Davidson 1 wk ago, who took her off vicodin and prescribed Tylenol 1 gm scheduled. The pt reports that this medication makes her feel sleepy so she can only take it at night. She would like something else for the pain during the day. She has previously tried icy/hot patches, tramadol, ibuprofen, and lower dose Tylenol with no relief. She is trying to get her paperwork together so she can meet with Rudell Cobb and possibly go to pain clinic.   Preventive Screening-Counseling & Management  Alcohol-Tobacco     Alcohol drinks/day: 0     Smoking Status: never  Caffeine-Diet-Exercise     Does Patient Exercise: yes     Type of exercise: WALKING     Exercise (avg: min/session): 4     Times/week: 30  Current Medications (verified): 1)  Hydrochlorothiazide 25 Mg Tabs (Hydrochlorothiazide) .... Take 1 Tablet By Mouth Once A Day 2)  Triamcinolone Acetonide 0.1 %  Crea (Triamcinolone Acetonide) .... Apply  Two Times A Day To Underarms For 1 Week 3)  Acetaminophen 500 Mg Caps (Acetaminophen) .... Take 2 Pill By Mouth 4 Times A Day. 4)  K-Lor 20 Meq Pack (Potassium Chloride) .... Take 1 Pill By Mouth On Alternate Days  Allergies (verified): No Known Drug Allergies  Review of Systems      See HPI  Physical Exam  General:  Well-developed,well-nourished,in no acute distress. Upset that she is not having her pain treated adequately. Head:  Normocephalic and atraumatic without obvious abnormalities. No apparent alopecia or balding. Msk:  tenderness L lower back, hurts when she bends forward Extremities:  L medial knee tenderness, normal ROM Neurologic:  alert & oriented X3.   Psych:  Oriented X3. Upset and frustrated because her pain is not being adequately treated.   Impression & Recommendations:  Problem # 1:  PAIN, CHRONIC NEC (ICD-338.29) She was unable to take the Tylenol 1g except at night because it made her "too sleepy." I reviewed her past clinic records and spoke with Dr. Sampson Goon about options, which are limited considering the number of things she has already tried for her pain. According to the last note by Dr. Aleene Davidson, she should not receive narcotics, so I did not presribe any today. I talked with the patient extensively about her options but she was not happy with the  options I provided to her, which included Ben-Gay ("I'm not going to walk around work smelling like Ben-Gay") and using icy/hot patches, lower dose Tylenol, and Ibuprofen all at the same time, an idea at which she scoffed. She wants to go to the Pain Clinic but does not have the paperwork in to be able to get a referral there yet. I said I would be happy to see if she could be seen at [facility], as they take patients who do not have insurance, but she was not happy with this idea since she takes the bus. I told her that I would recommend she come in soon to see Dr. Aleene Davidson, as he is much more familiar with her case than  I am, but she became angry and left.  Complete Medication List: 1)  Hydrochlorothiazide 25 Mg Tabs (Hydrochlorothiazide) .... Take 1 tablet by mouth once a day 2)  Triamcinolone Acetonide 0.1 % Crea (Triamcinolone acetonide) .... Apply two times a day to underarms for 1 week 3)  Acetaminophen 500 Mg Caps (Acetaminophen) .... Take 2 pill by mouth 4 times a day. 4)  K-lor 20 Meq Pack (Potassium chloride) .... Take 1 pill by mouth on alternate days  Prevention & Chronic Care Immunizations   Influenza vaccine: Fluvax 3+  (11/21/2008)   Influenza vaccine due: 10/24/2008    Tetanus booster: Not documented    Pneumococcal vaccine: Not documented  Other Screening   Pap smear:  Specimen Adequacy: Satisfactory for evaluation.   Interpretation/Result:Negative for intraepithelial Lesion or Malignancy.   Location: [facility].    (07/07/2007)   Pap smear due: 06/2008    Mammogram: ASSESSMENT: Negative - BI-RADS 1^MS DIGITAL SCREENING  (09/20/2008)   Mammogram action/deferral: Ordered  (09/13/2008)   Smoking status: never  (04/26/2009)  Lipids   Total Cholesterol: 191  (11/21/2008)   Lipid panel action/deferral: Lipid Panel ordered   LDL: 118  (11/21/2008)   LDL Direct: Not documented   HDL: 56  (11/21/2008)   Triglycerides: 84  (11/21/2008)    SGOT (AST): 18  (04/18/2009)   SGPT (ALT): 16  (04/18/2009)   Alkaline phosphatase: 55  (04/18/2009)   Total bilirubin: 0.3  (04/18/2009)  Hypertension   Last Blood Pressure: 137 / 83  (04/26/2009)   Serum creatinine: 0.73  (04/18/2009)   Serum potassium 3.9  (04/18/2009)  Self-Management Support :   Personal Goals (by the next clinic visit) :      Personal blood pressure goal: 140/90  (09/13/2008)     Personal LDL goal: 130  (09/13/2008)    Hypertension self-management support: Resources for patients handout  (04/26/2009)    Lipid self-management support: Resources for patients handout  (04/26/2009)        Resource  handout printed.

## 2010-05-01 ENCOUNTER — Encounter: Payer: Self-pay | Admitting: Internal Medicine

## 2010-05-12 ENCOUNTER — Other Ambulatory Visit (INDEPENDENT_AMBULATORY_CARE_PROVIDER_SITE_OTHER): Payer: BC Managed Care – PPO

## 2010-05-12 DIAGNOSIS — Z1211 Encounter for screening for malignant neoplasm of colon: Secondary | ICD-10-CM

## 2010-05-12 LAB — HEMOCCULT GUIAC POC 1CARD (OFFICE)
Card #2 Fecal Occult Blod, POC: NEGATIVE
Card #3 Fecal Occult Blood, POC: NEGATIVE
Fecal Occult Blood, POC: NEGATIVE

## 2010-05-12 NOTE — Progress Notes (Signed)
Addended by: Alric Quan on: 05/12/2010 03:35 PM   Modules accepted: Orders

## 2010-05-21 ENCOUNTER — Other Ambulatory Visit: Payer: Self-pay | Admitting: *Deleted

## 2010-05-21 MED ORDER — MELOXICAM 15 MG PO TABS: 15.0000 mg | ORAL_TABLET | Freq: Every day | ORAL | Status: DC

## 2010-05-21 MED ORDER — CAPSAICIN 0.025 % EX CREA: TOPICAL_CREAM | Freq: Two times a day (BID) | CUTANEOUS | Status: DC

## 2010-05-21 MED ORDER — POTASSIUM CHLORIDE CRYS ER 20 MEQ PO TBCR: 20.0000 meq | EXTENDED_RELEASE_TABLET | ORAL | Status: DC

## 2010-05-21 MED ORDER — TRIAMCINOLONE ACETONIDE 0.1 % EX CREA: TOPICAL_CREAM | Freq: Two times a day (BID) | CUTANEOUS | Status: DC

## 2010-05-21 MED ORDER — HYDROCHLOROTHIAZIDE 25 MG PO TABS: 25.0000 mg | ORAL_TABLET | Freq: Every day | ORAL | Status: DC

## 2010-05-21 NOTE — Telephone Encounter (Signed)
Unsure if HCTZ rx was sent electronicallly; it was called to [facility] pharmacy.

## 2010-05-21 NOTE — Telephone Encounter (Signed)
Also states she still is having leg pain at night.

## 2010-05-27 LAB — BASIC METABOLIC PANEL
CO2: 26 mEq/L (ref 19–32)
Calcium: 9 mg/dL (ref 8.4–10.5)
Creatinine, Ser: 0.65 mg/dL (ref 0.4–1.2)
GFR calc Af Amer: >60 mL/min (ref >60)
GFR calc non Af Amer: >60 mL/min (ref >60)
Glucose, Bld: 91 mg/dL (ref 70–99)
Sodium: 137 mEq/L (ref 135–145)

## 2010-05-27 LAB — COMPREHENSIVE METABOLIC PANEL
ALT: 15 U/L (ref 0–35)
AST: 19 U/L (ref 0–37)
Albumin: 3.3 g/dL — ABNORMAL LOW (ref 3.5–5.2)
Albumin: 3.6 g/dL (ref 3.5–5.2)
Alkaline Phosphatase: 53 U/L (ref 39–117)
BUN: 7 mg/dL (ref 6–23)
Calcium: 9.1 mg/dL (ref 8.4–10.5)
Creatinine, Ser: 0.67 mg/dL (ref 0.4–1.2)
Glucose, Bld: 97 mg/dL (ref 70–99)
Potassium: 3.8 mEq/L (ref 3.5–5.1)
Sodium: 138 mEq/L (ref 135–145)
Total Bilirubin: 0.6 mg/dL (ref 0.3–1.2)
Total Protein: 6.4 g/dL (ref 6.0–8.3)
Total Protein: 7.2 g/dL (ref 6.0–8.3)

## 2010-05-27 LAB — CARDIAC PANEL(CRET KIN+CKTOT+MB+TROPI)
CK, MB: 2.2 ng/mL (ref 0.3–4.0)
Relative Index: 1.4 (ref 0.0–2.5)
Troponin I: 0.01 ng/mL (ref 0.00–0.06)

## 2010-05-27 LAB — CBC
HCT: 35 % — ABNORMAL LOW (ref 36.0–46.0)
Hemoglobin: 11.3 g/dL — ABNORMAL LOW (ref 12.0–15.0)
Hemoglobin: 11.8 g/dL — ABNORMAL LOW (ref 12.0–15.0)
MCHC: 34.2 g/dL (ref 30.0–36.0)
MCHC: 34.7 g/dL (ref 30.0–36.0)
MCV: 91.4 fL (ref 78.0–100.0)
Platelets: 244 10*3/uL (ref 150–400)
Platelets: 260 10*3/uL (ref 150–400)
Platelets: 264 10*3/uL (ref 150–400)
RDW: 13.3 % (ref 11.5–15.5)
RDW: 13.3 % (ref 11.5–15.5)
RDW: 13.8 % (ref 11.5–15.5)

## 2010-05-27 LAB — HEMOGLOBIN A1C: Mean Plasma Glucose: 114 mg/dL

## 2010-05-27 LAB — RAPID URINE DRUG SCREEN, HOSP PERFORMED
Benzodiazepines: NOT DETECTED
Cocaine: NOT DETECTED
Tetrahydrocannabinol: NOT DETECTED

## 2010-05-27 LAB — DIFFERENTIAL
Basophils Absolute: 0.1 10*3/uL (ref 0.0–0.1)
Eosinophils Absolute: 0.2 10*3/uL (ref 0.0–0.7)
Eosinophils Relative: 4 % (ref 0–5)
Lymphocytes Relative: 41 % (ref 12–46)
Lymphs Abs: 2.2 10*3/uL (ref 0.7–4.0)
Monocytes Absolute: 0.5 10*3/uL (ref 0.1–1.0)

## 2010-05-27 LAB — IRON AND TIBC
Iron: 89 ug/dL (ref 42–135)
TIBC: 373 ug/dL (ref 250–470)

## 2010-05-27 LAB — FERRITIN: Ferritin: 6 ng/mL — ABNORMAL LOW (ref 10–291)

## 2010-05-27 LAB — RETICULOCYTES: Retic Count, Absolute: 29.7 10*3/uL (ref 19.0–186.0)

## 2010-05-27 LAB — LIPID PANEL
LDL Cholesterol: 124 mg/dL — ABNORMAL HIGH (ref 0–99)
Triglycerides: 68 mg/dL (ref <150)

## 2010-05-27 LAB — MAGNESIUM: Magnesium: 2 mg/dL (ref 1.5–2.5)

## 2010-05-27 LAB — HIV ANTIBODY (ROUTINE TESTING W REFLEX): HIV: NONREACTIVE

## 2010-05-27 LAB — CK TOTAL AND CKMB (NOT AT ARMC): CK, MB: 5.2 ng/mL — ABNORMAL HIGH (ref 0.3–4.0)

## 2010-07-03 ENCOUNTER — Encounter: Payer: BC Managed Care – PPO | Admitting: Physician Assistant

## 2010-07-11 NOTE — H&P (Signed)
[facility]  Patient:    Toni Gutierrez, Toni Gutierrez                      MRN: [id number] Adm. Date:  [id number] Disc. Date: [id number] Attending:  Doug Sou Dictator:   Valinda Hoar, N.P.                   Psychiatric Admission Assessment  DATE OF ADMISSION:  Jul 22, 1999  IDENTIFYING INFORMATION:  [name] is a 50 year old African-American married female admitted on a voluntary basis Jul 22, 1999, and was admitted for psychosis or possibly alcohol withdrawal.  HISTORY OF PRESENT ILLNESS:  The patient reports that she stopped drinking one week ago.  On Jul 16, 1999, she was drinking two 24 ounce cans of beer a day for at least a year, maybe every other day or more.  She states she was okay until Saturday night, Jul 19, 1999.  She heard voices and they were saying "look at her, isnt she cute."  She tried to ignore and she knew the voice was not real because she could not see anybody.  Apparently somebody kept moving her slippers and she wondered if they were spirits in the house.  At the same time she realized this was not true.  She said she had a sensation of wind blowing over her face while she was in bed and she felt like something was under her bed.  She felt like pins were sticking her in her legs.  She states she could not see the person, so she knew is was not real.  She denies tactile hallucinations, auditory hallucinations.  She has had no psychotic symptoms since she has been in the hospital since yesterday.  She did tell me when she was being stuck with the pins it was hurting.  Again, she states everything is okay.  She last had psychotic symptoms Sunday night and Monday but none since then.  She has no history of DTs.  No history of blackouts or seizures.  She denies any problems.  She states she feels fine now.  PAST PSYCHIATRIC HISTORY:  No previous inpatient or outpatient psychiatric treatment.  PAST MEDICAL HISTORY:  The patient sees  Dr. Sherrie Mustache at [facility].  She last saw her January of 2001.  Medical problems:  Hypertension, sinus problems.  MEDICATIONS: 1. Norvasc 5 mg q.d. 2. Zyrtec 10 mg q.d. for sinus problems.  DRUG ALLERGIES:  No known drug allergies.  SOCIAL HISTORY:  The patient has been married since 1983; however, upon further questioning she states that she and her husband have been separated for two years.  They do have four children, ages 80, 79, 65, and 11.  The patient has a boyfriend who she has been seeing for two years.  One brother was killed some years ago in a drive-by shooting in [place].  She has four sisters.  Her mother still lives in [place].  She completed the 12th grade.  She was laid off from [employer].  She is looking for  another job.  Currently she is receiving child support, Medicaid and food stamps.  FAMILY HISTORY:  No known history in the family.  ALCOHOL AND DRUG HISTORY:  Denies substance abuse, has been drinking since age 70.  Denies smoking.  PHYSICAL EXAMINATION:  See physical examination done at [facility], Jul 22, 1999.  Her potassium level was 2.6.  CURRENT MENTAL STATUS  EXAMINATION:  A casually dressed, young African-American female who is cooperative.  Speech normal and relevant.  Mood:  Euthymic. Affect:  Euthymic.  Denies suicidal ideation, homicidal ideations.  Thought processes logical and coherent without evidence of any psychotic symptoms. Cognitive:  She was alert and oriented.  Her cognitive function was intact. She was not having any tremors.  There were no signs or symptoms of withdrawal.  CURRENT DIAGNOSES: Axis I.    Substance-induced mood disorder with psychotic symptoms and            rule out delirium tremens, resolved. Axis II.   Deferred. Axis III.  Hypertension, sinus difficulty. Axis IV.   Severe related to current substance abuse problems. Axis V.    Current global assessment of  functioning 40, highest            the past year 60.  CURRENT TREATMENT PLAN AND RECOMMENDATION:  Voluntary admission to [facility].  Check q.15 minutes and maintain her safety. Apparently the patient was in DTs on admission.  She stopped drinking on Jul 16, 1999.  She has had really no symptoms since either Sunday or Monday. No psychotic symptoms.  No hallucinations and no paranoia.  We will probably observe her closely today.  She is on Librium 25 mg q.4h. p.r.n. for any signs or symptoms of withdrawal.  Her potassium was low at 2.6.  We will give her K-Dur 20 mEq p.o. now and will repeat her potassium level.  Will continue her Norvasc 5 mg q.d. and her Zyrtec 10 mg q.d. and have her attend the substance abuse group.  TENTATIVE LENGTH AND DISCHARGE PLAN:  Three days at the most. DD:  07/23/99 TD:  07/24/99 Job: [id number]

## 2010-07-11 NOTE — Op Note (Signed)
NAMEMARVINE, ENCALADE            ACCOUNT NO.:  [id number]   MEDICAL RECORD NO.:  [id number]          PATIENT TYPE:  AMB   LOCATION:  DSC                          FACILITY:  [facility]   PHYSICIAN:  Mila Homer. Sherlean Foot, M.D. DATE OF BIRTH:  [birth year]   DATE OF PROCEDURE:  04/01/2005  DATE OF DISCHARGE:                                 OPERATIVE REPORT   PREOPERATIVE DIAGNOSIS:  Left knee anterior cruciate ligament tear.   POSTOPERATIVE DIAGNOSIS:  Left knee anterior cruciate ligament tear.   PROCEDURE:  Left knee ACL reconstruction.   SURGEON:  Mila Homer. Sherlean Foot, M.D.   ASSISTANTArlys John D. Petrarca, P.A.-C.   ANESTHESIA:  General anesthesia.   INDICATIONS FOR PROCEDURE:  Patient is a 50 year old who injured her ACL.  Informed consent was obtained.   DESCRIPTION OF PROCEDURE:  Patient was laid supine and administered general  anesthesia.  The left leg was prepped and draped in the usual sterile  fashion. The PA thawed the allograft bone-tendon-bone construct on the back  table and began working on the graft.  Diagnostic arthroscopy after  portals  were created revealed no chondromalacia, no meniscus tears.  The ACL had  been avulsed off the femoral attachment.  Great white shaver was used  through the inferomedial portal to remove the ACL.  I then used a 4 mm  cylindrical bur and performed an aggressive notchplasty.  I then used the  Arthrex system and used the tibial guide set on 55 degree angle cut and  reamed up into the joint.  I then placed a 7 mm offset Arthrex femoral guide  and placed the beef needle through and out the lateral cortex and the  lateral skin.  My P.A. was then finished with the graft and we patched the  graft easily.  I then placed a Nitinol wire anterior to the femoral bone  plug through a separate stab incision.  I followed that with a 7 x 25 mm  screw.  At this point, I then placed that Nitinol anterior to the tibial  bone plug coming into the joint.  I then  removed the camera into 10 degrees  of flexion with the posterior drawer applied, tension on the graft and  placed another 7 x 25 mm screw.  I then removed that Nitinol wire, did a  drawer and Lachman.  That completely eliminated the drawer and Lachman.  The  palpation of the graft looked excellent.  I then irrigated and closed with 2-  0 nylon stitch subcuticular in the tunnel incision and then 4-0 nylons  throughout with Steri-Strips, dressings, sponges, sterile Webril and Ace  wrap.   COMPLICATIONS:  None.   DRAINS:  None.   TOURNIQUET TIME:  24 minutes.           ______________________________  Mila Homer Sherlean Foot, M.D.     SDL/MEDQ  D:  04/01/2005  T:  04/01/2005  Job:  [id number]

## 2010-07-11 NOTE — Op Note (Signed)
NAMETORY, MCKISSACK            ACCOUNT NO.:  [id number]   MEDICAL RECORD NO.:  [id number]          PATIENT TYPE:  AMB   LOCATION:  DSC                          FACILITY:  [facility]   PHYSICIAN:  Mila Homer. Sherlean Foot, M.D. DATE OF BIRTH:  Jun 03, 1960   DATE OF PROCEDURE:  01/21/2005  DATE OF DISCHARGE:                                 OPERATIVE REPORT   SURGEON:  Dr. Sherlean Foot.   ASSISTANT:  Richardean Canal, P.A.   ANESTHESIA:  General.   PREOPERATIVE DIAGNOSIS:  Right shoulder rotator cuff tear.   POSTOPERATIVE DIAGNOSIS:  Right shoulder rotator cuff tear.   PROCEDURE PERFORMED:  Right shoulder mini open rotator cuff repair,  subacromial decompression, glenohumeral debridement and distal clavicle  resection.   INDICATIONS FOR PROCEDURE:  The patient is a 50 year old female with MRI  evidence of full-thickness rotator cuff tearing.  Informed consent was  obtained.   DESCRIPTION OF PROCEDURE:  The patient was laid supine, administered general  anesthesia.  Placed in a beach chair position and the right shoulder was  prepped and draped in the usual sterile fashion.  Diagnostic arthroscopy  revealed some labral tearing and undersurface subscapularis and supra and  infraspinatus tearing.  The Kuda shaver was used through the anterior portal  to debride the rotator cuff and the labral tear.  I then redirected the  scope into the subacromial space and from the direct lateral portal,  performed a bursectomy.  I then used the 4.0 mm cylindrical bur to perform  an aggressive anterolateral acromioplasty.  I then performed a distal  clavicle resection with a 4.0 mm cylindrical bur.  Following that, I then  performed a CA ligament release.  I then evacuated the arthroscopic portion  of the case and converted to a mini open technique.  I split the deltoid  along the lines of its raphe, placed a self-retaining shoulder retractor in  place.  The rotator cuff measured 1 cm x 0.5 cm.  A 4.0 cylindrical  bur was  used to create a bony bleeding bed.  I placed one single 5.5 mm Bio  corkscrew anchor and placed a modified Mason-Allen suture to repair the  rotator cuff.  I then placed one side-to-side suture as well to reinforce  the rotator cuff.  I then irrigated and closed with 0 and 2-0 Vicryl  sutures, 4-0 nylon sutures in the skin, dressed with Adaptic, 4 x 4's, 2  inch silk tape and a simple sling and swath.   COMPLICATIONS:  None.   DRAINS:  None.          ______________________________  Mila Homer. Sherlean Foot, M.D.    SDL/MEDQ  D:  01/21/2005  T:  01/22/2005  Job:  [id number]

## 2010-07-11 NOTE — H&P (Signed)
[facility]  Patient:    Toni Gutierrez, Toni Gutierrez                      MRN: [id number] Adm. Date:  [id number] Attending:  Marlyn Corporal Fabmy Dictator:   Valinda Hoar, N.P.                   Psychiatric Admission Assessment  IDENTIFYING INFORMATION:  Toni Gutierrez is a 50 year old African-American separated female admitted on a voluntary basis September 13, 1999 for alcohol detox.  She stopped drinking September 10, 1999.  HISTORY OF PRESENT ILLNESS:  The patient reports that she was drinking 2-3 24 ounce beers this past Monday, Tuesday and Wednesday.  She said she stopped then because she "got that feeling", the tingling in her feet.  The ACT team described it as a tactile hallucination.  She states it was "itching real bad" and keeping her from sleeping.  Prior to admission she had slept poor for two days.  Her appetite was poor, however, there was no weight change.  She denies depression, denies suicidal or homicidal ideation.  She states she feels better.  She is no longer having the itching.  She states she misses her four children.  No hallucinations currently.  PAST PSYCHIATRIC HISTORY:  The patient denies any previous inpatient treatment, however, she has had one hospitalization at [facility] a couple of months ago for detox.  She has also been to [facility] one year ago.  She denies outpatient treatment.  PAST MEDICAL HISTORY:  The patient sees Dr. Sherrie Mustache at [facility]. She last saw her six months ago.  She has an appointment September 26, 1999. Medical problems include hypertension, seasonal allergies and sinus problems.  MEDICATIONS:  High blood pressure medications, but she does not know the name of it.  K-Dur 20 mEq p.o. q.d., Zyrtec 10 mg 1 p.o. q.d. for seasonal allergies.  DRUG ALLERGIES:  No known drug allergies.  SOCIAL HISTORY:  The patient has been separated from her husband for -6 years. They had been married sixteen years.  She has been  involved in a significant relationship for two years.  She has four children, three daughters and one son and one grandchild.  Her mother is living and they get along relatively well.  However, all of her family lives in [place].  She has one brother who was killed years ago in a drive-by shooting.  She completed the eleventh grade, some secretarial school.  She was laid off a couple months ago from her job.  She currently lives with her boyfriend of two years and her four children.  She denies any significant financial problems.  FAMILY HISTORY:  None known.  ALCOHOL/DRUG HISTORY:  Drinking since the age of 10.  Denies substance abuse. Denies smoking.  PHYSICAL EXAMINATION:  Please see physical exam done at [facility] ED on September 13, 1999.  Her potassium was decreased at 3.1 and her blood pressure was 138/98.  CURRENT MENTAL STATUS EXAM:  Appearance:  A casually dressed African-American female who is cooperative, pleasant.  Speech:  Normal relevant. mood anxious. Affect anxious.  Denies suicidal ideation, denies homicidal ideation.  Thought processes are logical and coherent without evidence of psychosis.  She is having no tactile hallucinations.  Cognitive:  Alert and oriented.  Cognitive functioning is intact.  CURRENT DIAGNOSES: Axis I:    Depressive disorder, not otherwise specified, alcohol dependence. Axis II:   Deferred. Axis III:  Hypertension  and seasonal allergies. Axis IV:   Severe, related to problems with primary support group, social            environment and medical problems. Axis V:    Current global assessment of functioning 40, highest in past year            1.  TREATMENT PLANS AND RECOMMENDATION:  Voluntary admission to [facility].  Maintain safety. Check patient every fifteen minutes. Will detox her using a low dose Librium protocol, force fluids including Gatorade.  Will continue the Zyrtec 10 mg p.o. q.d., Verapamil 40 mg  p.o. t.i.d., Neurontin 300 mg p.o. t.i.d., K-Dur 20 mEq p.o. q.d. and have patient attend all substance abuse groups. Tentative length of stay and discharge plan - 3 days. DD:  09/14/99 TD:  09/16/99 Job: [id number]

## 2010-07-11 NOTE — Discharge Summary (Signed)
[facility]  Patient:    GRACELAND, WACHTER                      MRN: [id number] Adm. Date:  [id number] Disc. Date: [id number] Attending:  Esau Grew Dictator:   Johnella Moloney, N.P.                           Discharge Summary  HISTORY OF PRESENT ILLNESS:  Ms. Laural Benes is a 50 year old African-American female admitted on a voluntary basis for psychosis or possibly an alcohol withdrawal.  The patient reports that she stopped drinking one week ago.  On Jul 16, 1999 she was drinking two 24 ounce cans of beer a day for at least a year, maybe every other day more.  She states she was okay until Saturday night, May 26.  She heard voices and they were saying, "Look at her, isnt she cute?"  She tried to ignore them and she knew the voice was not real, because she could not see anybody.  Apparently somebody kept moving her slippers and she wondered if there were spirits in the house.  At the same time she realized this was not true.  She said she had a sensation of wind blowing over her face while she was in bed and she felt like somebody was under her bed. She felt like pins were sticking her in her legs.  She states she could not see the person so she knew it was not real.  She denies tactile hallucinations, auditory hallucinations.  She has had no psychotic symptoms since she has been in the hospital since yesterday.  She did tell me when she was being stuck with the pins it was hurting again.  She says everything is okay.  She last had psychotic symptoms Sunday night and Monday, but none since then.  She has no history of DTs, no history of blackouts or seizures.  She denies any problems.  She states she feels fine now.  Patient has had no previous inpatient or outpatient psychiatric treatment.  Patient sees Dr. Sherrie Mustache at [facility].  She last saw him January 2001.  MEDICAL PROBLEMS:  Hypertension and sinus problems.  ADMISSION  MEDICATIONS: 1. Norvasc 5 mg q.d. 2. Zyrtec 10 mg q.d. for "sinus problems."  ALLERGIES:  She denies any known drug allergies.  PHYSICAL EXAMINATION:  Please see physical exam done at [facility] Department Jul 22, 1999.  LABORATORY:  Her potassium level was low at 246.  Her CBC was within normal limits except for RBC being low at 3.78, MCV high at 100.9, and her potassium was low at 3.2.  Her T3 uptake was high at 37.1, TSH normal 1.593, free T4 was within normal limits at 1.12.  Her urine drug screen was done and it was negative.  Her urinalysis was within normal limits but did show some urobilinogen at 1.0 which was slightly elevated.  HOSPITAL COURSE:  She was given some K-Dur initially due to her low potassium. On May 31 she denied further psychiatric symptoms.  No hallucinations, sleeping and eating well.  Her mood was fairly euthymic.  No dysthymia or self destructive thoughts.  She complained of itching.  She was having no withdrawal symptoms.  She states that she just felt ready to go home.  While she was in the hospital, it was felt that she probably had already gone through withdrawal prior to  coming in to [facility] unit and she was simply placed on her normal medications, Norvasc and Zyrtec, and then we added Librium 25 mg q.4h. p.r.n. for any withdrawal symptoms.  We did repeat her potassium level.  On day #2 of admission we felt that she no longer met criteria for hospitalization, and we felt she could be managed on an outpatient basis in a safe manner.  CONDITION ON DISCHARGE:  Patient is discharged in improved condition with improvement in her mood, sleep, appetite, no suicidal ideations, no psychotic symptoms.  There were no further withdrawal signs and symptoms and she was felt to be improved.  DISPOSITION:  Patient was discharged home.  DISCHARGE MEDICATION: 1. Norvasc 5 mg one tab a day. 2. Zyrtec 10 mg one tab daily. 3. K-Dur 25  mg one tab daily.  FOLLOW-UP:  It was felt that patient could follow up at [facility] or she could go to the [facility], which is a private rehab facility, and she could contact [facility] for any problems related to her potassium level.  FINAL DIAGNOSES: Axis I:    Alcohol dependence. Axis II:   Deferred. Axis III:  1. Hypertension.            2. Sinus difficulty. Axis IV:   Moderate. Axis V:    Current global assessment of functioning 54, highest in the            past year 60. DD:  08/07/99 TD:  08/12/99 Job: [id number]

## 2010-07-11 NOTE — Discharge Summary (Signed)
[facility]  Patient:    Toni Gutierrez, Toni Gutierrez                      MRN: [id number] Adm. Date:  [id number] Disc. Date: [id number] Attending:  Marlyn Corporal Fabmy                           Discharge Summary  ADMISSION DIAGNOSES: Axes I:    1. Alcohol dependence.            2. Depressive disorder, not otherwise specified. Axis II:   Deferred. Axis III:  Hypertension. Axis IV:   Severe, psychosocial problems. Axis V:    Forty on admission and 70 during the past year.  DISCHARGE DIAGNOSES: Axes I:    1. Alcohol dependence.            2. Depression, not otherwise specified. Axis II:   Deferred. Axis III:  Hypertension. Axis IV:   Severe. Axis V:    Forty on admission and 70 on discharge.  BRIEF HISTORY:  Patient is a 50 year old divorced black female who has a long history of alcohol abuse.  She presented with tactile hallucinations and visual hallucinations.   She provided a history of hypertension.  She states she stopped drinking on September 10, 1999 and usually drinks two to three cans of malt liquor daily.  Patient states that she has been experiencing burning and itching bilaterally on the bottoms of her feet.  She states that this happens whenever she has tried to stop drinking.  She also states that she sees things floating in space.  She has a history of several inpatient hospitalizations for detoxification.  She denies suicidal ideas, homicidal ideas, severe feelings of depression and anorexia.  The patient is separated from her husband, for 2 years, after 16 years of marriage.  She has four children.  PERTINENT PHYSICAL AND LABORATORY DATA:  Physical examination on admission was unremarkable.  Patients blood pressure on admission was 140/101.  She was placed on verapamil 40 mg t.i.d. and her blood pressure normalized at 112/82. Other vitals were normal.  Patient also received Zyrtec 10 mg for allergies. She has been on Zyrtec by [facility].  She also takes K-Dur but her potassium was normal and patient did not receive a prescription for this on discharge.  Laboratory data included mild elevation of the SGOT at 53 on the hepatic function panel.  Serum microscopic was unremarkable.  Comprehensive metabolic profile was normal.  CBC was unremarkable.  Thyroid profile was normal.  CBC showed normal values.  Alcohol level on admission was 10 mg%.  COURSE IN HOSPITAL:  Patient was detoxified unremarkably with a low-dose Librium protocol.  She received Thiamine 100 mg IM on admission and then 100 mg daily.  Neurontin 300 mg t.i.d. as added because of what sounded like symptoms of peripheral neuritis.  The patients detoxification progressed unremarkably without symptoms of minor or major withdrawal.  The burning of her feet rapidly resolved.  Today, she denies tactile or visual hallucinations, notes that her appetite is good and that she slept through the night and feels back in control.  MEDICATION AND FOLLOWUP:  Patient is discharged with prescriptions for Zyrtec 10 mg daily, verapamil 40 mg t.i.d. and Neurontin 300 mg t.i.d.  She is advised to stop drinking, follow up with AA and to follow up at the mental health center; she is also to follow up  with [facility]. DD:  09/16/99 TD:  09/18/99 Job: [id number]

## 2010-07-15 ENCOUNTER — Ambulatory Visit (INDEPENDENT_AMBULATORY_CARE_PROVIDER_SITE_OTHER): Payer: BC Managed Care – PPO | Admitting: Internal Medicine

## 2010-07-15 VITALS — BP 134/86 | HR 66 | Temp 97.9°F | Ht 59.0 in | Wt 132.6 lb

## 2010-07-15 DIAGNOSIS — S46219A Strain of muscle, fascia and tendon of other parts of biceps, unspecified arm, initial encounter: Secondary | ICD-10-CM

## 2010-07-15 DIAGNOSIS — G5692 Unspecified mononeuropathy of left upper limb: Secondary | ICD-10-CM

## 2010-07-15 DIAGNOSIS — S46819A Strain of other muscles, fascia and tendons at shoulder and upper arm level, unspecified arm, initial encounter: Secondary | ICD-10-CM

## 2010-07-15 DIAGNOSIS — S43499A Other sprain of unspecified shoulder joint, initial encounter: Secondary | ICD-10-CM

## 2010-07-15 DIAGNOSIS — G5690 Unspecified mononeuropathy of unspecified upper limb: Secondary | ICD-10-CM

## 2010-07-15 MED ORDER — METHOCARBAMOL 500 MG PO TABS: 500.0000 mg | ORAL_TABLET | Freq: Four times a day (QID) | ORAL | Status: AC

## 2010-07-15 MED ORDER — MELOXICAM 15 MG PO TABS: 15.0000 mg | ORAL_TABLET | Freq: Every day | ORAL | Status: DC

## 2010-07-15 NOTE — Patient Instructions (Signed)
Muscle Strain / Pulled Muscle A pulled muscle, or muscle strain, occurs when a muscle is over-stretched. A small number of muscle fibers may also be torn. This is especially common in athletes. This happens when a sudden violent force placed on a muscle pushes it past its capacity. Usually, recovery from a pulled muscle takes 1 to 2 weeks. But complete healing will take 5 to 6 weeks. There are millions of muscle fibers. Following injury, your body will usually return to normal quickly. HOME CARE INSTRUCTIONS  While awake, apply ice to the sore muscle for 20-30 minutes each hour for the first 2 days. Put ice in a plastic bag and place a towel between the bag of ice and your skin.   Do not use the pulled muscle for several days. Do not use the muscle if you have pain.   You may wrap the injured area with an elastic bandage for comfort. Be careful not to bind it too tightly. This may interfere with blood circulation.   Only take over-the-counter or prescription medicines for pain, discomfort, or fever as directed by your caregiver. Do not use aspirin as this will increase bleeding (bruising) at injury site.   Warming up before exercise helps prevent muscle strains.  SEEK MEDICAL CARE IF: There is increased pain or swelling in the affected area. MAKE SURE YOU:   Understand these instructions.   Will watch your condition.   Will get help right away if you are not doing well or get worse.  Document Released: 02/09/2005 Document Re-Released: 05/06/2009 [facility] Patient Information 2011 [place], [place].  1. You can use the Mobic (meloxicam) once daily for the pain  2. Ibuprofen 600 mg (3 of the Over the counter pills) every 6 hours for the pain is okay as well.  3. Use the Ice pack to help with the pain as directed above.  4. This should slowly improve over the next few weeks.    5. Use the splint for your wrist every night and if you can at work.  You should also use the elbow sleeve to  help protect your left elbow while your working.

## 2010-07-15 NOTE — Progress Notes (Signed)
  Subjective:    Patient ID: Toni Gutierrez, female    DOB: February 08, 1961, 50 y.o.   MRN: [id number]  HPI  [name] is a 50 year old woman who presents today complaining of pain in her left arm since Wednesday.  She was carrying a tray at work and states she felt like she was going to drop it and when she went to catch it felt a twinge in her left biceps area.  Since that time she has had pain in the left biceps. She has been taking advil that she states doesn't help, naproxen which hurts her stomach, and her muscle relaxer that helps her sleep but doesn't not help the pain.  She has tried ice packs which do help the pain.  She denies any pop or snapping sound, bulging of the muscle, dislocation,  numbness in the arm, or weakness.  She states that the pain does go up from the middle of the biceps to the front of the shoulder.    She also states that she has had problems for sometime with numbness and tingling in her hands, the left worse then the right.  She feels it mostly at night when she sleeps on the side or when she has been holding something for too long.  She states that if she shakes her hands they will get some feeling back.  She denies neck pain, trauma, swelling, or weakness in her hands.  Review of Systems    ROS negative except for as noted in the HPI  Objective:   Physical Exam    Constitutional: Vital signs reviewed.  Patient is a well-developed and well-nourished woman in no acute distress and cooperative with exam. Alert and oriented x3.  Head: Normocephalic and atraumatic Ear: TM normal bilaterally Mouth: no erythema or exudates, MMM Eyes: PERRL, EOMI, conjunctivae normal, No scleral icterus.  Neck: Supple, Trachea midline normal ROM, No JVD, mass, thyromegaly, or carotid bruit present.  Cardiovascular: RRR, S1 normal, S2 normal, no MRG, pulses symmetric and intact bilaterally Pulmonary/Chest: CTAB, no wheezes, rales, or rhonchi Abdominal: Soft. Non-tender, non-distended,  bowel sounds are normal, no masses, organomegaly, or guarding present.  GU: no CVA tenderness Musculoskeletal: No joint deformities, erythema, or stiffness, ROM in the bilateral shoulders limited in all planes secondary to pain.  There is point tenderness at the midpoint of the left biceps and over the biceps tendon in the shoulder.  Elbow ROM normal.  Strength 4/5 in bilateral shoulders and 5/5 in elbows, wrists, and grip.  Sensation to light touch is mildly dulled in the left hand compared to the right.  Tenel's sign is negative over the carpel tunnel and ulnar nerve in the elbow.  Median nerve compression test is negative.   Neurological: A&O x3, Strength is normal and symmetric bilaterally, cranial nerve II-XII are grossly intact, no focal motor deficit, sensory intact to light touch bilaterally.  Skin: Warm, dry and intact. No rash, cyanosis, or clubbing.  Psychiatric: Normal mood and affect. speech and behavior is normal. Judgment and thought content normal. Cognition and memory are normal.       Assessment & Plan:

## 2010-07-19 ENCOUNTER — Encounter: Payer: Self-pay | Admitting: Internal Medicine

## 2010-07-19 NOTE — Assessment & Plan Note (Signed)
She appears to have mild cases of left carpel tunnel as well as mild ulnar neuropathy.  It does not appear to be radicular and the right side is normal.  Since it does wake her sometimes at night we will have her get and use a wrist splint as well as an elbow pad to try to protect them both.  If they continue to become a problem or she develops weakness we will refer her for EMG testing.

## 2010-07-19 NOTE — Assessment & Plan Note (Signed)
Her pain in her shoulder is consistent with a left biceps strain.  There does not appear to be any tear and she has normal elbow strength.  We will treat conservatively with ice packs, heat packs, and Mobic and have her follow up if it doesn't not improve over the next few weeks.

## 2010-09-08 NOTE — Progress Notes (Signed)
Addended by: Neomia Dear on: 09/08/2010 04:56 PM   Modules accepted: Orders

## 2010-10-22 ENCOUNTER — Inpatient Hospital Stay (INDEPENDENT_AMBULATORY_CARE_PROVIDER_SITE_OTHER)
Admission: RE | Admit: 2010-10-22 | Discharge: 2010-10-22 | Disposition: A | Discharge status: Home / Self Care | Payer: BC Managed Care – PPO | Source: Ambulatory Visit | Attending: Family Medicine | Admitting: Family Medicine

## 2010-10-22 DIAGNOSIS — M25519 Pain in unspecified shoulder: Secondary | ICD-10-CM

## 2010-10-22 DIAGNOSIS — M67919 Unspecified disorder of synovium and tendon, unspecified shoulder: Secondary | ICD-10-CM

## 2010-10-22 LAB — POCT URINALYSIS DIP (DEVICE)
Bilirubin Urine: NEGATIVE
Glucose, UA: NEGATIVE mg/dL
Hgb urine dipstick: NEGATIVE
Nitrite: NEGATIVE
Specific Gravity, Urine: 1.015 (ref 1.005–1.030)
Urobilinogen, UA: 0.2 mg/dL (ref 0.0–1.0)
pH: 5.5 (ref 5.0–8.0)

## 2010-12-31 ENCOUNTER — Encounter (HOSPITAL_COMMUNITY): Payer: Self-pay | Admitting: *Deleted

## 2010-12-31 ENCOUNTER — Emergency Department (INDEPENDENT_AMBULATORY_CARE_PROVIDER_SITE_OTHER)
Admission: EM | Admit: 2010-12-31 | Discharge: 2010-12-31 | Disposition: A | Discharge status: Home / Self Care | Payer: BC Managed Care – PPO | Source: Home / Self Care | Attending: Emergency Medicine | Admitting: Emergency Medicine

## 2010-12-31 DIAGNOSIS — IMO0002 Reserved for concepts with insufficient information to code with codable children: Secondary | ICD-10-CM

## 2010-12-31 DIAGNOSIS — M67919 Unspecified disorder of synovium and tendon, unspecified shoulder: Secondary | ICD-10-CM

## 2010-12-31 DIAGNOSIS — M7580 Other shoulder lesions, unspecified shoulder: Secondary | ICD-10-CM

## 2010-12-31 DIAGNOSIS — G5600 Carpal tunnel syndrome, unspecified upper limb: Secondary | ICD-10-CM

## 2010-12-31 MED ORDER — TRAMADOL HCL 50 MG PO TABS: 100.0000 mg | ORAL_TABLET | Freq: Three times a day (TID) | ORAL | Status: AC | PRN

## 2010-12-31 MED ORDER — DICLOFENAC SODIUM 75 MG PO TBEC: 75.0000 mg | DELAYED_RELEASE_TABLET | Freq: Two times a day (BID) | ORAL | Status: DC

## 2010-12-31 NOTE — ED Provider Notes (Signed)
History     CSN: [id number] Arrival date & time: 12/31/2010  5:10 PM   First MD Initiated Contact with Patient 12/31/10 1714      Chief Complaint  Patient presents with  . Shoulder Pain    pt with c/o right shoulder pain /left lower back radiating down left leg - bilateral hand pain intemittently no known injury   . Leg Pain  . Hand Pain    (Consider location/radiation/quality/duration/timing/severity/associated sxs/prior treatment) HPI Comments: Toni Gutierrez has had a many year history of chronic musculoskeletal complaints. She has had pain in her right shoulder for years. She had surgery on his shoulder years ago and it was better for a while but over the past week it's been worse. She denies any specific injury. It's worse with abduction and compression. She denies any radiation down the arm, numbness, tingling, or weakness. She also has pain, weakness, and numbness in both hands. This is also been worse the past week. She denies any swelling. She also has had low back pain for years. She was diagnosed as having a pinched nerve. Her pain is worse at nighttime and worse if she bends or twists. It shoots down both legs. Her legs hurt as well. They feel numb and tingly at times and weak. No swelling of the legs.  Patient is a 50 y.o. female presenting with shoulder pain, leg pain, and hand pain.  Shoulder Pain  Leg Pain  Associated symptoms include numbness.  Hand Pain    Past Medical History  Diagnosis Date  . Hypertension   . Hyperlipidemia   . Shoulder pain, bilateral     MRI right shoulder showing tear of infraspinatus tendon with bursal surface fraying of the infraspinatus  . Chronic lower back pain     MRI of cervical, lumbar spine (all obtained by Dr. Sherlean Foot in 2006)- showing mild DDD with bulges but without disc herniation, at multiple levels; mild facet joint dz, primarily at L3-4 and L4-5 bilaterally  . Left knee pain     s/p ACL reconstruction by Dr. Sherlean Foot  . Carpal  tunnel syndrome, right   . Alcohol abuse   . Chronic headache     Thought to be 2/2 cervical DJD vs anxiety. CT head in 2010 without any significant findings  . Allergic rhinitis     Past Surgical History  Procedure Date  . Tubal ligation 1989  . Anterior cruciate ligament repair     By Dr. Sherlean Foot  . Rotator cuff repair     Right shoulder    History reviewed. No pertinent family history.  History  Substance Use Topics  . Smoking status: Never Smoker   . Smokeless tobacco: Not on file  . Alcohol Use: Not on file    OB History    Grav Para Term Preterm Abortions TAB SAB Ect Mult Living                  Review of Systems  Musculoskeletal: Positive for myalgias, back pain and arthralgias. Negative for joint swelling and gait problem.  Skin: Negative for rash and wound.  Neurological: Positive for weakness and numbness.    Allergies  Review of patient's allergies indicates no known allergies.  Home Medications   Current Outpatient Rx  Name Route Sig Dispense Refill  . CAPSAICIN 0.025 % EX CREA Topical Apply topically 2 (two) times daily. Apply to the affected area with a glove 3-4 times a day as needed for pain. 60 g 3  .  DICLOFENAC SODIUM 75 MG PO TBEC Oral Take 1 tablet (75 mg total) by mouth 2 (two) times daily. 20 tablet 0  . HYDROCHLOROTHIAZIDE 25 MG PO TABS Oral Take 1 tablet (25 mg total) by mouth daily. 60 tablet 3  . MELOXICAM 15 MG PO TABS Oral Take 1 tablet (15 mg total) by mouth daily after lunch daily after lunch. 30 tablet 3  . POTASSIUM CHLORIDE CRYS CR 20 MEQ PO TBCR Oral Take 1 tablet (20 mEq total) by mouth every other day. 15 tablet 3  . TRAMADOL HCL 50 MG PO TABS Oral Take 2 tablets (100 mg total) by mouth every 8 (eight) hours as needed for pain. Maximum dose= 8 tablets per day 30 tablet 0  . TRIAMCINOLONE ACETONIDE 0.1 % EX CREA Topical Apply topically 2 (two) times daily. Apply two times a day to underarms for 1 week. 30 g 0    BP 159/92  Pulse  70  Temp(Src) 98.7 F (37.1 C) (Oral)  Resp 20  SpO2 100%  LMP 12/25/2010  Physical Exam  Nursing note and vitals reviewed. Constitutional: She is oriented to person, place, and time. She appears well-developed and well-nourished. No distress.  Musculoskeletal: Normal range of motion. She exhibits tenderness. She exhibits no edema.       She has generalized muscle, joint, and soft tissue tenderness to palpation. She has posterior right shoulder pain to palpation. The shoulder has very good range of motion. She does describe pain with abduction. Muscle strength is normal. Impingement signs are positive. She has pain up and down both arms and wrist areas the carpal tunnels. There was no swelling. Wrist has a full range of motion. Tinel's and Phalen's signs were negative. Muscle strength was normal in the upper extremities DTRs and sensation exam of her lower back reveals pain to palpation diffusely up and down the entire back with limited range of motion and pain. Straight leg raising was positive bilaterally. Muscle strength, DTRs, and sensation was normal in the lower extremities  Neurological: She is alert and oriented to person, place, and time. She has normal strength and normal reflexes. She displays no atrophy. No sensory deficit. She exhibits normal muscle tone.  Skin: Skin is warm and dry. No rash noted. She is not diaphoretic.    ED Course  Procedures (including critical care time)  Labs Reviewed - No data to display No results found.   1. Rotator cuff tendonitis   2. Carpal tunnel syndrome   3. Degenerative disc disease       MDM  She has multiple musculoskeletal pain. This is most compatible with fibromyalgia. She may just have a collection of multiple pain syndromes such as rotator cuff tendinitis, carpal tunnel syndrome, and degenerative disease. She needs followup with primary care physician. I have given her a couple pain meds for tonight.        Roque Lias, MD 12/31/10 2213

## 2011-01-01 ENCOUNTER — Encounter: Payer: BC Managed Care – PPO | Admitting: Internal Medicine

## 2011-01-05 ENCOUNTER — Encounter: Payer: BC Managed Care – PPO | Admitting: Internal Medicine

## 2011-02-19 ENCOUNTER — Other Ambulatory Visit (HOSPITAL_COMMUNITY): Payer: Self-pay | Admitting: Internal Medicine

## 2011-02-19 DIAGNOSIS — Z1231 Encounter for screening mammogram for malignant neoplasm of breast: Secondary | ICD-10-CM

## 2011-03-23 ENCOUNTER — Ambulatory Visit (HOSPITAL_COMMUNITY): Payer: BC Managed Care – PPO

## 2011-06-08 ENCOUNTER — Emergency Department (INDEPENDENT_AMBULATORY_CARE_PROVIDER_SITE_OTHER)
Admission: EM | Admit: 2011-06-08 | Discharge: 2011-06-08 | Disposition: A | Discharge status: Home / Self Care | Payer: BC Managed Care – PPO | Source: Home / Self Care

## 2011-06-08 ENCOUNTER — Encounter (HOSPITAL_COMMUNITY): Payer: Self-pay | Admitting: Emergency Medicine

## 2011-06-08 DIAGNOSIS — L02214 Cutaneous abscess of groin: Secondary | ICD-10-CM

## 2011-06-08 DIAGNOSIS — J309 Allergic rhinitis, unspecified: Secondary | ICD-10-CM

## 2011-06-08 DIAGNOSIS — L03319 Cellulitis of trunk, unspecified: Secondary | ICD-10-CM

## 2011-06-08 DIAGNOSIS — G8929 Other chronic pain: Secondary | ICD-10-CM

## 2011-06-08 MED ORDER — FLUTICASONE PROPIONATE 50 MCG/ACT NA SUSP: 2.0000 | Freq: Every day | NASAL | Status: DC

## 2011-06-08 MED ORDER — DOXYCYCLINE HYCLATE 100 MG PO CAPS: 100.0000 mg | ORAL_CAPSULE | Freq: Two times a day (BID) | ORAL | Status: AC

## 2011-06-08 MED ORDER — DICLOFENAC SODIUM 75 MG PO TBEC: 75.0000 mg | DELAYED_RELEASE_TABLET | Freq: Two times a day (BID) | ORAL | Status: DC

## 2011-06-08 NOTE — ED Notes (Signed)
PT HERE WITH MULTIPLE SX TODAY.C/O ALLERGY SX  SNEEZING,AND CONGESTION,LEFT LEG INTERMITT SHARP PAIN AND RIGHT GROIN BOIL.PT WAS ABLE TO GET SOME DRAINAGE OUT BOIL BUT STATES THE PAIN HAS WORSENED.DENIES FEVERS,CHILLS.SX STARTED X 2WEEKS AGO

## 2011-06-08 NOTE — ED Provider Notes (Signed)
History     CSN: [id number]  Arrival date & time 06/08/11  0931   None     Chief Complaint  Patient presents with  . Allergies  . Recurrent Skin Infections  . Leg Pain    (Consider location/radiation/quality/duration/timing/severity/associated sxs/prior treatment) HPI Comments: Patient presents today with multiple concerns. She states that she developed a "knot" in her right groin 2 days ago. She was able to squeeze it and get some pus to come out. It does seem slightly larger to her though she has difficulty seeing this area. She has no previous history of same. No fever or chills. Patient also reports nasal congestion, and left nasal swelling. She states she feels like she has a scabbed sore on the inside of her left nostril, and if she picks at it it bleeds. She denies sneezing, rhinorrhea or post nasal drainage. Patient also states that she has a history of a pinched nerve in her left lumbar spine since 2007 that radiates down her left leg. She states she has run out of her pain medication and is requesting a prescription for pain medication. She states that her primary care provider has been treating her either with tramadol or Tylenol #3. Patient also admits that she has gabapentin at home she is not taking as prescribed, and has a muscle relaxer that she uses as needed. She states that her next appointment with her primary care physician is not until the end of next month. Her back pain and left leg pain is unchanged. She has had no recent injury.   Past Medical History  Diagnosis Date  . Hypertension   . Hyperlipidemia   . Shoulder pain, bilateral     MRI right shoulder showing tear of infraspinatus tendon with bursal surface fraying of the infraspinatus  . Chronic lower back pain     MRI of cervical, lumbar spine (all obtained by Dr. Sherlean Foot in 2006)- showing mild DDD with bulges but without disc herniation, at multiple levels; mild facet joint dz, primarily at L3-4 and L4-5  bilaterally  . Left knee pain     s/p ACL reconstruction by Dr. Sherlean Foot  . Carpal tunnel syndrome, right   . Alcohol abuse   . Chronic headache     Thought to be 2/2 cervical DJD vs anxiety. CT head in 2010 without any significant findings  . Allergic rhinitis     Past Surgical History  Procedure Date  . Tubal ligation 1989  . Anterior cruciate ligament repair     By Dr. Sherlean Foot  . Rotator cuff repair     Right shoulder    History reviewed. No pertinent family history.  History  Substance Use Topics  . Smoking status: Never Smoker   . Smokeless tobacco: Not on file  . Alcohol Use: No    OB History    Grav Para Term Preterm Abortions TAB SAB Ect Mult Living                  Review of Systems  Constitutional: Negative for fever, chills and fatigue.  HENT: Positive for congestion. Negative for ear pain, sore throat, rhinorrhea, sneezing, postnasal drip and sinus pressure.   Respiratory: Negative for cough.   Musculoskeletal: Positive for back pain.    Allergies  Review of patient's allergies indicates no known allergies.  Home Medications   Current Outpatient Rx  Name Route Sig Dispense Refill  . CAPSAICIN 0.025 % EX CREA Topical Apply topically 2 (two) times daily. Apply  to the affected area with a glove 3-4 times a day as needed for pain. 60 g 3  . DICLOFENAC SODIUM 75 MG PO TBEC Oral Take 1 tablet (75 mg total) by mouth 2 (two) times daily. 20 tablet 0  . DOXYCYCLINE HYCLATE 100 MG PO CAPS Oral Take 1 capsule (100 mg total) by mouth 2 (two) times daily. 14 capsule 0  . FLUTICASONE PROPIONATE 50 MCG/ACT NA SUSP Nasal Place 2 sprays into the nose daily. 16 g 0  . HYDROCHLOROTHIAZIDE 25 MG PO TABS Oral Take 1 tablet (25 mg total) by mouth daily. 60 tablet 3  . TRIAMCINOLONE ACETONIDE 0.1 % EX CREA Topical Apply topically 2 (two) times daily. Apply two times a day to underarms for 1 week. 30 g 0    BP 142/79  Pulse 68  Temp(Src) 98.1 F (36.7 C) (Oral)  Resp 16   SpO2 100%  Physical Exam  Nursing note and vitals reviewed. Constitutional: She appears well-developed and well-nourished. No distress.  HENT:  Head: Normocephalic and atraumatic.  Right Ear: Tympanic membrane, external ear and ear canal normal.  Left Ear: Tympanic membrane, external ear and ear canal normal.  Nose: Mucosal edema (Rt nasal turbs moderately swollen. Lt nasal turbs severely swollen, and nasal passage is closed. ) present.  Mouth/Throat: Uvula is midline, oropharynx is clear and moist and mucous membranes are normal. No oropharyngeal exudate, posterior oropharyngeal edema or posterior oropharyngeal erythema.  Neck: Neck supple.  Cardiovascular: Normal rate, regular rhythm and normal heart sounds.   Pulmonary/Chest: Effort normal and breath sounds normal. No respiratory distress.  Musculoskeletal:       Lumbar back: She exhibits tenderness. She exhibits no bony tenderness, no swelling, no edema and no spasm.       Back:  Lymphadenopathy:    She has no cervical adenopathy.  Neurological: She is alert.  Skin: Skin is warm and dry.     Psychiatric: She has a normal mood and affect.    ED Course  Procedures (including critical care time)  Labs Reviewed - No data to display No results found.   1. Abscess of groin, right   2. Rhinitis, allergic   3. Chronic pain       MDM  Pt reports that her PCP has been prescribing Tramadol and Tylenol #3 for her chronic LBP. [place] narcotic registry shows that she has been receiving Hydrocodone 5/325. Pt was advised that she needed to f/u with her PCP regarding her pain mgmt. That we do not treat chronic pain. She is to continue her muscle relaxer, restart Gabapentin as prescribed by her PCP, and I did refill Diclofenac for her.          Melody Comas, [place] 06/08/11 1130

## 2011-06-08 NOTE — Discharge Instructions (Signed)
Continue your muscle relaxer and Gabapentin as prescribed by your primary care dr. Bonita Quin will need to contact your primary care dr for pain medication. I have prescribed an antibiotic for your groin infection. Also use warm compresses 3-4 times a day until symptoms improve. The prescription nasal spray is to help with your nasal swelling. Use once a day. You may also spray an over the counter nasal saline spray into your nose as often as needed during daytime hours.    Chronic Pain Discharge Instructions  Emergency care providers appreciate that many patients coming to [place] are in severe pain and we wish to address their pain in the safest, most responsible manner.  It is important to recognize however, that the proper treatment of chronic pain differs from that of the pain of injuries and acute illnesses.  Our goal is to provide quality, safe, personalized care and we thank you for giving [place] the opportunity to serve you. The use of narcotics and related agents for chronic pain syndromes may lead to additional physical and psychological problems.  Nearly as many people die from prescription narcotics each year as die from car crashes.  Additionally, this risk is increased if such prescriptions are obtained from a variety of sources.  Therefore, only your primary care physician or a pain management specialist is able to safely treat such syndromes with narcotic medications long-term.    Documentation revealing such prescriptions have been sought from multiple sources may prohibit [place] from providing a refill or different narcotic medication.  Your name may be checked first through the [facility] Controlled Substances Reporting System.  This database is a record of controlled substance medication prescriptions that the patient has received.  This has been established by [facility] in an effort to eliminate the dangerous, and often life threatening, practice of obtaining multiple prescriptions from  different medical providers.   If you have a chronic pain syndrome (i.e. chronic headaches, recurrent back or neck pain, dental pain, abdominal or pelvis pain without a specific diagnosis, or neuropathic pain such as fibromyalgia) or recurrent visits for the same condition without an acute diagnosis, you may be treated with non-narcotics and other non-addictive medicines.  Allergic reactions or negative side effects that may be reported by a patient to such medications will not typically lead to the use of a narcotic analgesic or other controlled substance as an alternative.   Patients managing chronic pain with a personal physician should have provisions in place for breakthrough pain.  If you are in crisis, you should call your physician.  If your physician directs you to the emergency department, please have the doctor call and speak to our attending physician concerning your care.   When patients come to the Emergency Department (ED) with acute medical conditions in which the Emergency Department physician feels appropriate to prescribe narcotic or sedating pain medication, the physician will prescribe these in very limited quantities.  The amount of these medications will last only until you can see your primary care physician in his/her office.  Any patient who returns to the ED seeking refills should expect only non-narcotic pain medications.   In the event of an acute medical condition exists and the emergency physician feels it is necessary that the patient be given a narcotic or sedating medication -  a responsible adult driver should be present in the room prior to the medication being given by the nurse.   Prescriptions for narcotic or sedating medications that have been lost, stolen  or expired will not be refilled in the Emergency Department.    Patients who have chronic pain may receive non-narcotic prescriptions until seen by their primary care physician.  It is every patient's personal  responsibility to maintain active prescriptions with his or her primary care physician or specialist.

## 2011-06-11 NOTE — ED Provider Notes (Signed)
Medical screening examination/treatment/procedure(s) were performed by non-physician practitioner and as supervising physician I was immediately available for consultation/collaboration.  LANEY,RONNIE   Loa Idler B Laney, MD 06/11/11 2125 

## 2011-07-17 ENCOUNTER — Other Ambulatory Visit: Payer: Self-pay | Admitting: *Deleted

## 2011-07-17 MED ORDER — HYDROCHLOROTHIAZIDE 25 MG PO TABS: 25.0000 mg | ORAL_TABLET | Freq: Every day | ORAL | Status: DC

## 2011-08-07 ENCOUNTER — Emergency Department (HOSPITAL_COMMUNITY)
Admission: EM | Admit: 2011-08-07 | Discharge: 2011-08-08 | Disposition: A | Discharge status: Home / Self Care | Payer: Self-pay | Attending: Emergency Medicine | Admitting: Emergency Medicine

## 2011-08-07 ENCOUNTER — Encounter (HOSPITAL_COMMUNITY): Payer: Self-pay | Admitting: Emergency Medicine

## 2011-08-07 DIAGNOSIS — I10 Essential (primary) hypertension: Secondary | ICD-10-CM | POA: Insufficient documentation

## 2011-08-07 DIAGNOSIS — E785 Hyperlipidemia, unspecified: Secondary | ICD-10-CM | POA: Insufficient documentation

## 2011-08-07 DIAGNOSIS — G8929 Other chronic pain: Secondary | ICD-10-CM | POA: Insufficient documentation

## 2011-08-07 DIAGNOSIS — M51379 Other intervertebral disc degeneration, lumbosacral region without mention of lumbar back pain or lower extremity pain: Secondary | ICD-10-CM | POA: Insufficient documentation

## 2011-08-07 DIAGNOSIS — M5137 Other intervertebral disc degeneration, lumbosacral region: Secondary | ICD-10-CM | POA: Insufficient documentation

## 2011-08-07 DIAGNOSIS — R252 Cramp and spasm: Secondary | ICD-10-CM | POA: Insufficient documentation

## 2011-08-07 NOTE — ED Notes (Signed)
PT. REPORTS RIGHT THIGH PAIN FOR 2 DAYS , DENIES INJURY , STATES INJURED HER RIGHT 2ND TOE LAST WEEK .

## 2011-08-08 LAB — CBC
HCT: 34.4 % — ABNORMAL LOW (ref 36.0–46.0)
MCH: 30.7 pg (ref 26.0–34.0)
MCV: 90.3 fL (ref 78.0–100.0)
RBC: 3.81 MIL/uL — ABNORMAL LOW (ref 3.87–5.11)
WBC: 7.2 10*3/uL (ref 4.0–10.5)

## 2011-08-08 LAB — POCT I-STAT, CHEM 8
BUN: 16 mg/dL (ref 6–23)
Chloride: 105 mEq/L (ref 96–112)
Creatinine, Ser: 0.8 mg/dL (ref 0.50–1.10)
Sodium: 143 mEq/L (ref 135–145)

## 2011-08-08 LAB — CK TOTAL AND CKMB (NOT AT ARMC): Relative Index: 3.1 — ABNORMAL HIGH (ref 0.0–2.5)

## 2011-08-08 MED ORDER — HYDROCODONE-ACETAMINOPHEN 5-325 MG PO TABS
1.0000 | ORAL_TABLET | Freq: Once | ORAL | Status: AC
  Administered 2011-08-08: 1 via ORAL
  Filled 2011-08-08: qty 1

## 2011-08-08 MED ORDER — DIAZEPAM 5 MG PO TABS
5.0000 mg | ORAL_TABLET | Freq: Once | ORAL | Status: AC
  Administered 2011-08-08: 5 mg via ORAL
  Filled 2011-08-08: qty 1

## 2011-08-08 MED ORDER — DIAZEPAM 5 MG PO TABS: 5.0000 mg | ORAL_TABLET | Freq: Three times a day (TID) | ORAL | Status: AC | PRN

## 2011-08-08 MED ORDER — IBUPROFEN 600 MG PO TABS: 600.0000 mg | ORAL_TABLET | Freq: Four times a day (QID) | ORAL | Status: AC | PRN

## 2011-08-08 NOTE — ED Notes (Signed)
Pt c/ cramping pain in bilateral thighs worse in right. Denies trauma

## 2011-08-08 NOTE — ED Notes (Signed)
Discharge via teach back 

## 2011-08-08 NOTE — ED Provider Notes (Signed)
History     CSN: [id number]  Arrival date & time 08/07/11  2324   First MD Initiated Contact with Patient 08/07/11 2359      Chief Complaint  Patient presents with  . Leg Pain    (Consider location/radiation/quality/duration/timing/severity/associated sxs/prior treatment) Patient is a 51 y.o. female presenting with leg pain. The history is provided by the patient.  Leg Pain  The incident occurred more than 2 days ago. There was no injury mechanism. The pain is present in the right thigh and left thigh. The quality of the pain is described as sharp and aching. The pain is at a severity of 9/10. The pain is moderate. Pertinent negatives include no numbness, no loss of motion, no muscle weakness, no loss of sensation and no tingling.  Pt states 3 days ago, she began having cramps in bilateral thighs, right worse then left. States since then pain has worsened, now it is constant. States it is difficult to walk. Denies weakness or numbness in her legs or feet. Denies back pain.  Past Medical History  Diagnosis Date  . Hypertension   . Hyperlipidemia   . Shoulder pain, bilateral     MRI right shoulder showing tear of infraspinatus tendon with bursal surface fraying of the infraspinatus  . Chronic lower back pain     MRI of cervical, lumbar spine (all obtained by Dr. Sherlean Foot in 2006)- showing mild DDD with bulges but without disc herniation, at multiple levels; mild facet joint dz, primarily at L3-4 and L4-5 bilaterally  . Left knee pain     s/p ACL reconstruction by Dr. Sherlean Foot  . Carpal tunnel syndrome, right   . Alcohol abuse   . Chronic headache     Thought to be 2/2 cervical DJD vs anxiety. CT head in 2010 without any significant findings  . Allergic rhinitis     Past Surgical History  Procedure Date  . Tubal ligation 1989  . Anterior cruciate ligament repair     By Dr. Sherlean Foot  . Rotator cuff repair     Right shoulder    No family history on file.  History  Substance Use  Topics  . Smoking status: Never Smoker   . Smokeless tobacco: Not on file  . Alcohol Use: No    OB History    Grav Para Term Preterm Abortions TAB SAB Ect Mult Living                  Review of Systems  Constitutional: Negative for fever and chills.  HENT: Negative for neck pain.   Respiratory: Negative for chest tightness and shortness of breath.   Cardiovascular: Negative for chest pain.  Gastrointestinal: Negative for nausea, vomiting and abdominal pain.  Genitourinary: Negative for dysuria.  Musculoskeletal: Positive for myalgias. Negative for back pain.  Skin: Negative.   Neurological: Negative for dizziness, tingling, weakness and numbness.  Psychiatric/Behavioral: Negative.     Allergies  Review of patient's allergies indicates no known allergies.  Home Medications   Current Outpatient Rx  Name Route Sig Dispense Refill  . GABAPENTIN 100 MG PO CAPS Oral Take 100 mg by mouth daily.    Marland Kitchen HYDROCHLOROTHIAZIDE 25 MG PO TABS Oral Take 1 tablet (25 mg total) by mouth daily. 60 tablet 6  . MELOXICAM 15 MG PO TABS Oral Take 15 mg by mouth daily.    Marland Kitchen METHOCARBAMOL 500 MG PO TABS Oral Take 500 mg by mouth 3 (three) times daily as needed. For muscle spasms    .  ADULT MULTIVITAMIN W/MINERALS CH Oral Take 1 tablet by mouth daily.    . TRAMADOL HCL 50 MG PO TABS Oral Take 50 mg by mouth every 4 (four) hours as needed. For pain      BP 175/91  Pulse 70  Temp 97.5 F (36.4 C) (Oral)  Resp 16  SpO2 98%  Physical Exam  Nursing note and vitals reviewed. Constitutional: She is oriented to person, place, and time. She appears well-developed and well-nourished.       Moaning in pain  HENT:  Head: Normocephalic.  Eyes: Conjunctivae are normal.  Cardiovascular: Normal rate, regular rhythm and normal heart sounds.   Pulmonary/Chest: Effort normal and breath sounds normal. No respiratory distress. She has no wheezes. She has no rales.  Abdominal: Soft. Bowel sounds are normal.  There is no tenderness.  Musculoskeletal:       Normal appearing bilateral legs and thighs with no bruising, redness, swelling. Tender to palpation in bilateral quads, right greater then left. Pain with flexion of quadricept muscle, pain with straight leg elevation. Full ROM of hip and knee bilaterally  Neurological: She is alert and oriented to person, place, and time.  Skin: Skin is warm and dry.  Psychiatric: She has a normal mood and affect.    ED Course  Procedures (including critical care time)  Pt with bilateral quad pain, right worse then left. No other exam findings. No swelling in LE, doubt DVT. No tenderness below knee. No signs if infection: pt afebrile, no erythema, doubt cellulitis or an abscess. Labs ordered to r/o rhabdomyolysis.   Results for orders placed during the hospital encounter of 08/07/11  CBC      Component Value Range   WBC 7.2  4.0 - 10.5 K/uL   RBC 3.81 (*) 3.87 - 5.11 MIL/uL   Hemoglobin 11.7 (*) 12.0 - 15.0 g/dL   HCT 19.1 (*) 47.8 - 29.5 %   MCV 90.3  78.0 - 100.0 fL   MCH 30.7  26.0 - 34.0 pg   MCHC 34.0  30.0 - 36.0 g/dL   RDW 62.1  30.8 - 65.7 %   Platelets 261  150 - 400 K/uL  CK TOTAL AND CKMB      Component Value Range   Total CK 189 (*) 7 - 177 U/L   CK, MB 5.8 (*) 0.3 - 4.0 ng/mL   Relative Index 3.1 (*) 0.0 - 2.5  POCT I-STAT, CHEM 8      Component Value Range   Sodium 143  135 - 145 mEq/L   Potassium 3.4 (*) 3.5 - 5.1 mEq/L   Chloride 105  96 - 112 mEq/L   BUN 16  6 - 23 mg/dL   Creatinine, Ser 8.46  0.50 - 1.10 mg/dL   Glucose, Bld 85  70 - 99 mg/dL   Calcium, Ion 9.62  1.12 - 1.32 mmol/L   TCO2 25  0 - 100 mmol/L   Hemoglobin 11.9 (*) 12.0 - 15.0 g/dL   HCT 95.2 (*) 84.1 - 32.4 %   No results found.  Just mildly elevated total CK at 189. CKMB 5.8, doubt cardiac source, pt denies CP, SOB.  Suspect muscle spasms. Will start on muscle relaxant and follow up.   1. Muscle cramps       MDM         Lottie Mussel,  PA 08/08/11 [phone]

## 2011-08-08 NOTE — Discharge Instructions (Signed)
I suspect your pain is caused by muscle cramping. Make sure to drink plenty of fluids. Heating pad to the legs. Ibuprofen for pain. Valium for spasms. Follow up with your doctor in the office on Monday.   Leg Cramps Leg cramps that occur during exercise can be caused by poor circulation or dehydration. However, muscle cramps that occur at rest or during the night are usually not due to any serious medical problem. Heat cramps may cause muscle spasms during hot weather.  CAUSES There is no clear cause for muscle cramps. However, dehydration may be a factor for those who do not drink enough fluids and those who exercise in the heat. Imbalances in the level of sodium, potassium, calcium or magnesium in the muscle tissue may also be a factor. Some medications, such as water pills (diuretics), may cause loss of chemicals that the body needs (like sodium and potassium) and cause muscle cramps. TREATMENT   Make sure your diet has enough fluids and essential minerals for the muscle to work normally.   Avoid strenuous exercise for several days if you have been having frequent leg cramps.   Stretch and massage the cramped muscle for several minutes.   Some medicines may be helpful in some patients with night cramps. Only take over-the-counter or prescription medicines as directed by your caregiver.  SEEK IMMEDIATE MEDICAL CARE IF:   Your leg cramps become worse.   Your foot becomes cold, numb, or blue.  Document Released: 03/19/2004 Document Revised: 01/29/2011 Document Reviewed: 03/06/2008 [facility] Patient Information 2012 [place], [place].

## 2011-08-09 NOTE — ED Provider Notes (Signed)
Medical screening examination/treatment/procedure(s) were performed by non-physician practitioner and as supervising physician I was immediately available for consultation/collaboration.  Geoffery Lyons, MD 08/09/11 Windell Moment

## 2011-08-12 ENCOUNTER — Encounter: Payer: BC Managed Care – PPO | Admitting: Internal Medicine

## 2011-08-24 ENCOUNTER — Other Ambulatory Visit: Payer: Self-pay | Admitting: *Deleted

## 2011-08-24 MED ORDER — TRAMADOL HCL 50 MG PO TABS: 50.0000 mg | ORAL_TABLET | ORAL | Status: DC | PRN

## 2011-08-24 NOTE — Telephone Encounter (Signed)
Agree with refill and appt

## 2011-08-24 NOTE — Telephone Encounter (Signed)
Pt called with c/o pain in rt leg above the knee. This pain radiates to side.  This has been going on for a few months.   At night pain gets worse and it's hard to get [place] in morning.  The area is not red or swollen. Pain is sharp and at times she can't move. She states you can feel where it is. It is different that other leg.  No know injury.  She has tried muscle relaxer's but they just make her sleepy.  Tramadol has helped in past.  Pt seen in ED on 6/14 for same.   I am putting in for a refill on pain meds Scheduled appointment for Monday at 2:15

## 2011-08-31 ENCOUNTER — Ambulatory Visit (INDEPENDENT_AMBULATORY_CARE_PROVIDER_SITE_OTHER): Payer: Self-pay | Admitting: Internal Medicine

## 2011-08-31 ENCOUNTER — Encounter: Payer: Self-pay | Admitting: Internal Medicine

## 2011-08-31 VITALS — BP 146/93 | HR 72 | Temp 98.2°F | Ht 59.0 in | Wt 130.9 lb

## 2011-08-31 DIAGNOSIS — M79604 Pain in right leg: Secondary | ICD-10-CM | POA: Insufficient documentation

## 2011-08-31 DIAGNOSIS — I10 Essential (primary) hypertension: Secondary | ICD-10-CM

## 2011-08-31 DIAGNOSIS — Z0000 Encounter for general adult medical examination without abnormal findings: Secondary | ICD-10-CM

## 2011-08-31 DIAGNOSIS — M79606 Pain in leg, unspecified: Secondary | ICD-10-CM

## 2011-08-31 DIAGNOSIS — M79609 Pain in unspecified limb: Secondary | ICD-10-CM

## 2011-08-31 MED ORDER — TRAMADOL HCL 50 MG PO TABS: 50.0000 mg | ORAL_TABLET | ORAL | Status: DC | PRN

## 2011-08-31 NOTE — Assessment & Plan Note (Signed)
Patient refused a Pap smear and colonoscopy as well as mammogram today. Will postpone these tests.

## 2011-08-31 NOTE — Assessment & Plan Note (Signed)
Leg pain: Etiology is not clear currently. Patient has been having this leg pain for more than 3 months. It was worsening over the past 2 months. The location is above the right knee joint. There is no joint pain or joint swelling or redness. It doesn't look like arthritis in the knee joint. The differential diagnoses would include patellar tendinitis and muscle spasm. Since patient has been having pain for such a long time, it is important to rule out any possibility of bone tumor.  -will get X-ray over right knee joint and lower femur. If it is negative, will give her referral to orthopedic for further evaluation. -will give tramadol for pain.  -will let patient continue Mobic

## 2011-08-31 NOTE — Patient Instructions (Signed)
1. I will contract you when your X-ray result comes back. 2. Please take all medications as prescribed.  3. If you have worsening of your symptoms or new symptoms arise, please call the clinic ([phone]), or go to the ER immediately if symptoms are severe.

## 2011-08-31 NOTE — Assessment & Plan Note (Signed)
Patient is currently taking hydrochlorothiazide 25 mg daily. Her blood pressure is slightly elevated with bp of 146/ 93 today. Her leg pain may contribute to the elevation of her blood pressure. Will not change her current regimen. Will follow up. Will check her BMP today.

## 2011-08-31 NOTE — Progress Notes (Signed)
Patient ID: Toni Gutierrez, female   DOB: [birth year], 51 y.o.   MRN: [id number]  Subjective:   Patient ID: Toni Gutierrez female   DOB: [birth year] 51 y.o.   MRN: [id number]  HPI: Toni Gutierrez is a 51 y.o. with a past medical history as outlined below, who presents with leg pain.  Patient's reports that her leg pain started approximately 3 months ago. It is located at right upper leg just above the knee joint. It is dull pain and radiating to the upper thigh. It can reach 8/10 in severity. It is aggravated by walking or putting weight on the right leg. There is no injury history. There is no any known triggering factors. Her leg pain has been getting worse in recent 2 months. Because of severe pain, patient visited ED on 08/08/11. She was diagnosed with a muscle cramping and was discharged on ibuprofen for pain and Valium for spasm which did not help significantly. Patient doesn't have any pain in knee joint or hip joint. Does not have a fever, chills or any rashes. There is no swelling or redness over her knee joints. It is not associated with weakness, numbness or decreased sensation in her leg.   Past Medical History  Diagnosis Date  . Hypertension   . Hyperlipidemia   . Shoulder pain, bilateral     MRI right shoulder showing tear of infraspinatus tendon with bursal surface fraying of the infraspinatus  . Chronic lower back pain     MRI of cervical, lumbar spine (all obtained by Dr. Sherlean Foot in 2006)- showing mild DDD with bulges but without disc herniation, at multiple levels; mild facet joint dz, primarily at L3-4 and L4-5 bilaterally  . Left knee pain     s/p ACL reconstruction by Dr. Sherlean Foot  . Carpal tunnel syndrome, right   . Alcohol abuse   . Chronic headache     Thought to be 2/2 cervical DJD vs anxiety. CT head in 2010 without any significant findings  . Allergic rhinitis    Current Outpatient Prescriptions  Medication Sig Dispense Refill  . gabapentin (NEURONTIN) 100 MG  capsule Take 100 mg by mouth daily.      . hydrochlorothiazide (HYDRODIURIL) 25 MG tablet Take 1 tablet (25 mg total) by mouth daily.  60 tablet  6  . meloxicam (MOBIC) 15 MG tablet Take 15 mg by mouth daily.      . methocarbamol (ROBAXIN) 500 MG tablet Take 500 mg by mouth 3 (three) times daily as needed. For muscle spasms      . Multiple Vitamin (MULTIVITAMIN WITH MINERALS) TABS Take 1 tablet by mouth daily.      . traMADol (ULTRAM) 50 MG tablet Take 1 tablet (50 mg total) by mouth every 4 (four) hours as needed. For pain  60 tablet  0   No family history on file. History   Social History  . Marital Status: Married    Spouse Name: N/A    Number of Children: N/A  . Years of Education: N/A   Social History Main Topics  . Smoking status: Never Smoker   . Smokeless tobacco: None  . Alcohol Use: No  . Drug Use: No  . Sexually Active: None   Other Topics Concern  . None   Social History Narrative  . None   Review of Systems:  General: no fevers, chills, no changes in body weight, no changes in appetite Skin: no rash HEENT: no blurry vision, hearing changes or sore  throat Pulm: no dyspnea, coughing, wheezing CV: no chest pain, palpitations, shortness of breath Abd: no nausea/vomiting, abdominal pain, diarrhea/constipation GU: no dysuria, hematuria, polyuria Ext: right leg pain  Neuro: no weakness, numbness, or tingling  Objective:  Physical Exam: Filed Vitals:   08/31/11 1445  BP: 146/93  Pulse: 72  Temp: 98.2 F (36.8 C)  TempSrc: Oral  Height: 4\' 11"  (1.499 m)  Weight: 130 lb 14.4 oz (59.376 kg)  SpO2: 97%   general: resting in bed, not in acute distress HEENT: PERRL, EOMI, no scleral icterus Cardiac: S1/S2, RRR, No murmurs, gallops or rubs Pulm: Good air movement bilaterally, Clear to auscultation bilaterally, No rales, wheezing, rhonchi or rubs. Abd: Soft,  nondistended, nontender, no rebound pain, no organomegaly, BS present Ext: No rashes or edema, 2+DP/PT  pulse bilaterally. There is tenderness over right upper leg just above the knee joints. There is no swelling, warmth, or redness over the same area. There is no limitation of movement over knee joint or hip joints. Musculoskeletal: No joint deformities, erythema, or stiffness, ROM full and no nontender Skin: no rashes. No skin bruise. Neuro: alert and oriented X3, cranial nerves II-XII grossly intact, muscle strength 5/5 in all extremeties,  sensation to light touch intact.  Psych.: patient is not psychotic, no suicidal or hemocidal ideation.   Assessment & Plan:

## 2011-09-01 LAB — BASIC METABOLIC PANEL WITH GFR
BUN: 9 mg/dL (ref 6–23)
CO2: 26 mEq/L (ref 19–32)
Chloride: 109 mEq/L (ref 96–112)
Glucose, Bld: 64 mg/dL — ABNORMAL LOW (ref 70–99)
Potassium: 3.5 mEq/L (ref 3.5–5.3)
Sodium: 143 mEq/L (ref 135–145)

## 2011-09-08 ENCOUNTER — Ambulatory Visit (HOSPITAL_COMMUNITY)
Admission: RE | Admit: 2011-09-08 | Discharge: 2011-09-08 | Disposition: A | Discharge status: Home / Self Care | Payer: Self-pay | Source: Ambulatory Visit | Attending: Internal Medicine | Admitting: Internal Medicine

## 2011-09-08 DIAGNOSIS — M79606 Pain in leg, unspecified: Secondary | ICD-10-CM

## 2011-09-08 DIAGNOSIS — M79609 Pain in unspecified limb: Secondary | ICD-10-CM | POA: Insufficient documentation

## 2011-09-15 ENCOUNTER — Other Ambulatory Visit: Payer: Self-pay | Admitting: *Deleted

## 2011-09-18 ENCOUNTER — Other Ambulatory Visit: Payer: Self-pay | Admitting: *Deleted

## 2011-09-18 DIAGNOSIS — M79606 Pain in leg, unspecified: Secondary | ICD-10-CM

## 2011-09-21 MED ORDER — TRAMADOL HCL 50 MG PO TABS: 50.0000 mg | ORAL_TABLET | ORAL | Status: DC | PRN

## 2011-09-21 NOTE — Telephone Encounter (Signed)
Rx called in to pharmacy. 

## 2011-10-09 ENCOUNTER — Encounter: Payer: Self-pay | Admitting: Internal Medicine

## 2011-10-09 ENCOUNTER — Encounter: Payer: BC Managed Care – PPO | Admitting: Internal Medicine

## 2011-11-16 ENCOUNTER — Encounter: Payer: Self-pay | Admitting: Internal Medicine

## 2011-11-16 ENCOUNTER — Ambulatory Visit (INDEPENDENT_AMBULATORY_CARE_PROVIDER_SITE_OTHER): Payer: Self-pay | Admitting: Internal Medicine

## 2011-11-16 VITALS — BP 147/87 | HR 74 | Temp 97.5°F | Ht 59.0 in | Wt 131.6 lb

## 2011-11-16 DIAGNOSIS — M79606 Pain in leg, unspecified: Secondary | ICD-10-CM

## 2011-11-16 DIAGNOSIS — M79609 Pain in unspecified limb: Secondary | ICD-10-CM

## 2011-11-16 DIAGNOSIS — I10 Essential (primary) hypertension: Secondary | ICD-10-CM

## 2011-11-16 DIAGNOSIS — M7630 Iliotibial band syndrome, unspecified leg: Secondary | ICD-10-CM | POA: Insufficient documentation

## 2011-11-16 DIAGNOSIS — M629 Disorder of muscle, unspecified: Secondary | ICD-10-CM

## 2011-11-16 DIAGNOSIS — M545 Low back pain, unspecified: Secondary | ICD-10-CM

## 2011-11-16 DIAGNOSIS — M2420 Disorder of ligament, unspecified site: Secondary | ICD-10-CM

## 2011-11-16 MED ORDER — MELOXICAM 15 MG PO TABS: 15.0000 mg | ORAL_TABLET | Freq: Every day | ORAL | Status: DC

## 2011-11-16 MED ORDER — TRAMADOL HCL 50 MG PO TABS: 50.0000 mg | ORAL_TABLET | Freq: Three times a day (TID) | ORAL | Status: DC | PRN

## 2011-11-16 MED ORDER — HYDROCHLOROTHIAZIDE 25 MG PO TABS: 25.0000 mg | ORAL_TABLET | Freq: Every day | ORAL | Status: DC

## 2011-11-16 MED ORDER — METHOCARBAMOL 500 MG PO TABS: 500.0000 mg | ORAL_TABLET | Freq: Three times a day (TID) | ORAL | Status: DC | PRN

## 2011-11-16 NOTE — Patient Instructions (Signed)
1.  Restart the Robaxin 500 mg tablets.  Take 1 tablet three times daily for your pain.  2.  Continue the Meloxicam 15 mg tablets.  Take 1 tablet daily for the pain.  - Also Ice and heat to the area.  20 minutes at a time, several times daily can help with the inflammation as well  - It is okay to use the tramadol and Tylenol as well for the pain and inflammation  3.  Continue with the stretching and strengthening of the back and legs.  This will be the number one thing that will help your pain get better and stay better.  4.  Follow up in about 1 month to see how things are going.   Iliotibial Band Syndrome with Rehab The iliotibial (IT) band is a tendon that connects the hip muscles to the shinbone (tibia) and to one of the bones of the pelvis (ileum). The IT band passes by the knee and is often irritated by the outer portion of the knee (lateral femoral condyle). A fluid filled sac (bursa) exists between the tendon and the bone, to cushion and reduce friction. Overuse of the tendon may cause excessive friction, which results in IT band syndrome. This condition involves inflammation of the bursa (bursitis) and/or inflammation of the IT band (tendinitis). SYMPTOMS   Pain, tenderness, swelling, warmth, or redness over the IT band, at the outer knee (above the joint).   Pain that travels up or down the thigh or leg.   Initially, pain at the beginning of an exercise, that decreases once warmed up. Eventually, pain throughout the activity, getting worse as the activity continues. May cause the athlete to stop in the middle of training or competing.   Pain that gets worse when running down hills or stairs, on banked tracks, or next to the curb on the street.   Pain that increases when the foot of the affected leg hits the ground.   Possibly, a crackling sound (crepitation) when the tendon or bursa is moved or touched.  CAUSES  IT band syndrome is caused by irritation of the IT band and the  underlying bursa. This eventually results in inflammation and pain. IT band syndrome is an overuse injury.  RISK INCREASES WITH:  Sports with repetitive knee-bending activities (distance running, cycling).   Incorrect training techniques, including sudden changes in the intensity, frequency, or duration of training.   Not enough rest between workouts.   Poor strength and flexibility, especially a tight IT band.   Failure to warm up properly before activity.   Bow legs.   Arthritis of the knee.  PREVENTION   Warm up and stretch properly before activity.   Allow for adequate recovery between workouts.   Maintain physical fitness:   Strength, flexibility, and endurance.   Cardiovascular fitness.   Learn and use proper training technique, including reducing running mileage, shortening stride, and avoiding running on hills and banked surfaces.   Wear arch supports (orthotics), if you have flat feet.  PROGNOSIS  If treated properly, IT band syndrome usually goes away within 6 weeks of treatment. RELATED COMPLICATIONS   Longer healing time, if not properly treated, or if not given enough time to heal.   Recurring inflammation of the tendon and bursa, that may result in a chronic condition.   Recurring symptoms, if activity is resumed too soon, with overuse, with a direct blow, or with poor training technique.   Inability to complete training or competition.  TREATMENT  Treatment first involves the use of ice and medicine, to reduce pain and inflammation. The use of strengthening and stretching exercises may help reduce pain with activity. These exercises may be performed at home or with a therapist. For individuals with flat feet, an arch support (orthotic) may be helpful. Some individuals find that wearing a knee sleeve or compression bandage around the knee during workouts provides some relief. Certain training techniques, such as adjusting stride length, avoiding running on  hills or stairs, changing the direction you run on a circular or banked track, or changing the side of the road you run on, if you run next to the curb, may help decrease symptoms of IT band syndrome. Cyclists may need to change the seat height or foot position on their bicycles. An injection of cortisone into the bursa may be recommended. Surgery to remove the inflamed bursa and/or part of the IT band is only considered after at least 6 months of non-surgical treatment.  MEDICATION   If pain medicine is needed, nonsteroidal anti-inflammatory medicines (aspirin and ibuprofen), or other minor pain relievers (acetaminophen), are often advised.   Do not take pain medicine for 7 days before surgery.   Prescription pain relievers may be given, if your caregiver thinks they are needed. Use only as directed and only as much as you need.   Corticosteroid injections may be given by your caregiver. These injections should be reserved for the most serious cases, because they may only be given a certain number of times.  HEAT AND COLD  Cold treatment (icing) should be applied for 10 to 15 minutes every 2 to 3 hours for inflammation and pain, and immediately after activity that aggravates your symptoms. Use ice packs or an ice massage.   Heat treatment may be used before performing stretching and strengthening activities prescribed by your caregiver, physical therapist, or athletic trainer. Use a heat pack or a warm water soak.  SEEK MEDICAL CARE IF:   Symptoms get worse or do not improve in 2 to 4 weeks, despite treatment.   New, unexplained symptoms develop. (Drugs used in treatment may produce side effects.)  EXERCISES  RANGE OF MOTION (ROM) AND STRETCHING EXERCISES - Iliotibial Band Syndrome These exercises may help you when beginning to rehabilitate your injury. Your symptoms may go away with or without further involvement from your physician, physical therapist or athletic trainer. While completing  these exercises, remember:   Restoring tissue flexibility helps normal motion to return to the joints. This allows healthier, less painful movement and activity.   An effective stretch should be held for at least 30 seconds.   A stretch should never be painful. You should only feel a gentle lengthening or release in the stretched tissue.  STRETCH - Quadriceps, Prone   Lie on your stomach on a firm surface, such as a bed or padded floor.   Bend your right / left knee and grasp your ankle. If you are unable to reach your ankle or pant leg, use a belt around your foot to lengthen your reach.   Gently pull your heel toward your buttocks. Your knee should not slide out to the side. You should feel a stretch in the front of your thigh and knee.   Hold this position for __________ seconds.  Repeat __________ times. Complete this stretch __________ times per day.  STRETCH - Iliotibial Band  On the floor or bed, lie on your side, so your right / left leg is on top. [employer]  your knee and grab your ankle.   Slowly bring your knee back so that your thigh is in line with your trunk. Keep your heel at your buttocks and gently arch your back, so your head, shoulders and hips line up.   Slowly lower your leg so that your knee approaches the floor or bed, until you feel a gentle stretch on the outside of your right / left thigh. If you do not feel a stretch and your knee will not fall farther, place the heel of your opposite foot on top of your knee, and pull your thigh down farther.   Hold this stretch for __________ seconds.  Repeat __________ times. Complete this stretch __________ times per day. STRENGTHENING EXERCISES - Iliotibial Band Syndrome Improving the flexibility of the IT band will best relieve your discomfort due to IT band syndrome. Strengthening exercises, however, can help improve both muscle endurance and joint mechanics, reducing the factors that can contribute to this condition. Your  physician, physical therapist or athletic trainer may provide you with exercises that train specific muscle groups that are especially weak. The following exercises target muscles that are often weak in people who have IT band syndrome. STRENGTH - Hip Abductors, Straight Leg Raises  Be aware of your form throughout the entire exercise, so that you exercise the correct muscles. Poor form means that you are not strengthening the correct muscles.  Lie on your side, so that your head, shoulders, knee and hip line up. You may bend your lower knee to help maintain your balance. Your right / left leg should be on top.   Roll your hips slightly forward, so that your hips are stacked directly over each other and your right / left knee is facing forward.   Lift your top leg up 4-6 inches, leading with your heel. Be sure that your foot does not drift forward and that your knee does not roll toward the ceiling.   Hold this position for __________ seconds. You should feel the muscles in your outer hip lifting (you may not notice this until your leg begins to tire).   Slowly lower your leg to the starting position. Allow the muscles to fully relax before beginning the next repetition.  Repeat __________ times. Complete this exercise __________ times per day.  STRENGTH - Quad/VMO, Isometric  Sit in a chair with your right / left knee slightly bent. With your fingertips, feel the VMO muscle (just above the inside of your knee). The VMO is important in controlling the position of your kneecap.   Keeping your fingertips on this muscle. Without actually moving your leg, attempt to drive your knee down, as if straightening your leg. You should feel your VMO tense. If you have a difficult time, you may wish to try the same exercise on your healthy knee first.   Tense this muscle as hard as you can, without increasing any knee pain.   Hold for __________ seconds. Relax the muscles slowly and completely between each  repetition.  Repeat __________ times. Complete this exercise __________ times per day.  Document Released: 02/09/2005 Document Revised: 01/29/2011 Document Reviewed: 05/24/2008 [facility] Patient Information 2012 [place], [place].

## 2011-11-16 NOTE — Progress Notes (Signed)
Subjective:   Patient ID: Toni Gutierrez female   DOB: 11-Oct-1960 51 y.o.   MRN: [id number]  HPI: Ms.Toni Gutierrez is a 51 y.o. woman who presents to clinic today complaining of pain for months as well as follow up on her chronic medical condition including hypertension.    She states that she has a burning pain on the right thigh.  She states that the pain doesn't go down past her knee and worse when standing too long.  She denies weakness in the leg or changes in her bowel or bladder habits.  She also notes sharp pain down the left leg that starts in the back and then moves down the leg and down to just past the knee.  It is worse when walking or standing.  No numbness, tingling, bowel or bladder problems, or saddle anesthesia.  She states that she has been taking her gabapentin 200 mg TID but ran out the robaxin several months ago.  She states that it used to help.  She has also been taking meloxicam which helps as well. She has seen Dr. Sharolyn Douglas in the past who continues to follow her.  She states that she has done PT "not that long ago."    Past Medical History  Diagnosis Date  . Hypertension   . Hyperlipidemia   . Shoulder pain, bilateral     MRI right shoulder showing tear of infraspinatus tendon with bursal surface fraying of the infraspinatus  . Chronic lower back pain     MRI of cervical, lumbar spine (all obtained by Dr. Sherlean Foot in 2006)- showing mild DDD with bulges but without disc herniation, at multiple levels; mild facet joint dz, primarily at L3-4 and L4-5 bilaterally  . Left knee pain     s/p ACL reconstruction by Dr. Sherlean Foot  . Carpal tunnel syndrome, right   . Alcohol abuse   . Chronic headache     Thought to be 2/2 cervical DJD vs anxiety. CT head in 2010 without any significant findings  . Allergic rhinitis    Current Outpatient Prescriptions  Medication Sig Dispense Refill  . gabapentin (NEURONTIN) 100 MG capsule Take 100 mg by mouth daily.      .  hydrochlorothiazide (HYDRODIURIL) 25 MG tablet Take 1 tablet (25 mg total) by mouth daily.  60 tablet  6  . meloxicam (MOBIC) 15 MG tablet Take 1 tablet (15 mg total) by mouth daily.  30 tablet  1  . methocarbamol (ROBAXIN) 500 MG tablet Take 1 tablet (500 mg total) by mouth 3 (three) times daily as needed. For muscle spasms  90 tablet  2  . Multiple Vitamin (MULTIVITAMIN WITH MINERALS) TABS Take 1 tablet by mouth daily.      . traMADol (ULTRAM) 50 MG tablet Take 1 tablet (50 mg total) by mouth every 8 (eight) hours as needed. For pain  90 tablet  2  . DISCONTD: hydrochlorothiazide (HYDRODIURIL) 25 MG tablet Take 1 tablet (25 mg total) by mouth daily.  60 tablet  6   No family history on file. History   Social History  . Marital Status: Married    Spouse Name: N/A    Number of Children: N/A  . Years of Education: N/A   Social History Main Topics  . Smoking status: Never Smoker   . Smokeless tobacco: None  . Alcohol Use: No  . Drug Use: No  . Sexually Active: None   Other Topics Concern  . None   Social  History Narrative  . None   Review of Systems: A full 12 system ROS is negative except as noted in the HPI and A&P.   Objective:  Physical Exam: Filed Vitals:   11/16/11 1543  BP: 147/87  Pulse: 74  Temp: 97.5 F (36.4 C)  TempSrc: Oral  Height: 4\' 11"  (1.499 m)  Weight: 131 lb 9.6 oz (59.693 kg)  SpO2: 99%   Constitutional: Vital signs reviewed.  Patient is a well-developed and well-nourished woman in no acute distress and cooperative with exam. Alert and oriented x3.  Head: Normocephalic and atraumatic Ear: TM normal bilaterally Mouth: no erythema or exudates, MMM Eyes: PERRL, EOMI, conjunctivae normal, No scleral icterus.  Neck: Supple, Trachea midline normal ROM, No JVD, mass, thyromegaly, or carotid bruit present.  Cardiovascular: RRR, S1 normal, S2 normal, no MRG, pulses symmetric and intact bilaterally Pulmonary/Chest: CTAB, no wheezes, rales, or  rhonchi Abdominal: Soft. Non-tender, non-distended, bowel sounds are normal, no masses, organomegaly, or guarding present.  GU: no CVA tenderness Musculoskeletal: there is point tenderness over the bilateral IT band starting just below the lateral part of the knee on both the left and the right.  This pain tracks up the IT band to the hip.  She has tenderness to palpation over the lumbar spinal muscles bilaterally with Back ROM limited by pain in all planes.  No pain with internal or external rotation of the bilateral hips.  Hematology: no cervical, inginal, or axillary adenopathy.  Neurological: A&O x3, Strength is normal and symmetric bilaterally, cranial nerve II-XII are grossly intact, no focal motor deficit, sensory intact to light touch bilaterally.  Skin: Warm, dry and intact. No rash, cyanosis, or clubbing.  Psychiatric: Normal mood and affect. speech and behavior is normal. Judgment and thought content normal. Cognition and memory are normal.   Assessment & Plan:

## 2011-12-03 ENCOUNTER — Other Ambulatory Visit: Payer: Self-pay | Admitting: *Deleted

## 2011-12-03 DIAGNOSIS — I10 Essential (primary) hypertension: Secondary | ICD-10-CM

## 2011-12-03 MED ORDER — HYDROCHLOROTHIAZIDE 25 MG PO TABS: 25.0000 mg | ORAL_TABLET | Freq: Every day | ORAL | Status: DC

## 2011-12-04 NOTE — Telephone Encounter (Signed)
Rx called in to pharmacy. 

## 2011-12-16 ENCOUNTER — Ambulatory Visit: Payer: Self-pay | Admitting: Internal Medicine

## 2011-12-30 ENCOUNTER — Emergency Department (HOSPITAL_COMMUNITY)
Admission: EM | Admit: 2011-12-30 | Discharge: 2011-12-30 | Disposition: A | Discharge status: Home / Self Care | Payer: Self-pay | Attending: Emergency Medicine | Admitting: Emergency Medicine

## 2011-12-30 ENCOUNTER — Encounter (HOSPITAL_COMMUNITY): Payer: Self-pay | Admitting: Family Medicine

## 2011-12-30 ENCOUNTER — Emergency Department (HOSPITAL_COMMUNITY): Payer: Self-pay

## 2011-12-30 DIAGNOSIS — Z8669 Personal history of other diseases of the nervous system and sense organs: Secondary | ICD-10-CM | POA: Insufficient documentation

## 2011-12-30 DIAGNOSIS — I10 Essential (primary) hypertension: Secondary | ICD-10-CM | POA: Insufficient documentation

## 2011-12-30 DIAGNOSIS — M545 Low back pain, unspecified: Secondary | ICD-10-CM | POA: Insufficient documentation

## 2011-12-30 DIAGNOSIS — E785 Hyperlipidemia, unspecified: Secondary | ICD-10-CM | POA: Insufficient documentation

## 2011-12-30 DIAGNOSIS — G8929 Other chronic pain: Secondary | ICD-10-CM | POA: Insufficient documentation

## 2011-12-30 DIAGNOSIS — Z79899 Other long term (current) drug therapy: Secondary | ICD-10-CM | POA: Insufficient documentation

## 2011-12-30 DIAGNOSIS — M79609 Pain in unspecified limb: Secondary | ICD-10-CM | POA: Insufficient documentation

## 2011-12-30 DIAGNOSIS — M79604 Pain in right leg: Secondary | ICD-10-CM

## 2011-12-30 DIAGNOSIS — R51 Headache: Secondary | ICD-10-CM | POA: Insufficient documentation

## 2011-12-30 DIAGNOSIS — M129 Arthropathy, unspecified: Secondary | ICD-10-CM | POA: Insufficient documentation

## 2011-12-30 HISTORY — DX: Unspecified osteoarthritis, unspecified site: M19.90

## 2011-12-30 MED ORDER — HYDROCODONE-ACETAMINOPHEN 5-325 MG PO TABS: 1.0000 | ORAL_TABLET | ORAL | Status: DC | PRN

## 2011-12-30 NOTE — ED Provider Notes (Signed)
Pt received from Dr. Lynelle Doctor.  Pt presented to ED w/ 2 months progressively worsening RLE pain.  Results of xrays shared w/ patient.  She has an orthopedist and I recommended f/u asap.  Prescribed vicodin for pain.  Return precautions discussed.  Ambulatory at time of discharge.  9:26 PM   Otilio Miu, [place] 12/30/11 2126

## 2011-12-30 NOTE — ED Notes (Signed)
Pt. States she has had problems with her right leg over the last 2 months. States it just started hurting one day. It has progressively started hurting more until today when she decided she could not stand the pain anymore.

## 2011-12-30 NOTE — ED Provider Notes (Signed)
History  This chart was scribed for Celene Kras, MD by Albertha Ghee Rifaie. This patient was seen in room TR07C/TR07C and the patient's care was started at 7:30 PM.  CSN: [id number] Arrival date & time 12/30/11  1737   The history is provided by the patient. No language interpreter was used.   HPI Comments: Toni Gutierrez is a 51 y.o. female with a 7-month history of recurrent right leg pain, presents to the Emergency Department complaining of progressively worsening, dull, non-radiating, intermittent, severe right leg pain onset one day. Pain is worse while standing and walking for long periods. There is occasional lower right-sided pain. She denies fever, chills, numbness, weakness or abdominal pain. Patient was seen in the Outpatient clinic several months ago, but received no acute diagnosis. Patient hasn't followed up with her back specialist because of financial limitations. Other medical history includes HTN, alcohol abuse and arthritis.  Back Specialist - Dr. Tressie Ellis  Past Medical History  Diagnosis Date  . Hypertension   . Hyperlipidemia   . Shoulder pain, bilateral     MRI right shoulder showing tear of infraspinatus tendon with bursal surface fraying of the infraspinatus  . Chronic lower back pain     MRI of cervical, lumbar spine (all obtained by Dr. Sherlean Foot in 2006)- showing mild DDD with bulges but without disc herniation, at multiple levels; mild facet joint dz, primarily at L3-4 and L4-5 bilaterally  . Left knee pain     s/p ACL reconstruction by Dr. Sherlean Foot  . Carpal tunnel syndrome, right   . Alcohol abuse   . Chronic headache     Thought to be 2/2 cervical DJD vs anxiety. CT head in 2010 without any significant findings  . Allergic rhinitis   . Arthritis     Past Surgical History  Procedure Date  . Tubal ligation 1989  . Anterior cruciate ligament repair     By Dr. Sherlean Foot  . Rotator cuff repair     Right shoulder    No family history on file.  History  Substance  Use Topics  . Smoking status: Never Smoker   . Smokeless tobacco: Not on file  . Alcohol Use: No   No OB history provided.   Review of Systems  Constitutional: Negative for fever and chills.  Gastrointestinal: Negative for abdominal pain.  Musculoskeletal: Positive for back pain.  Neurological: Negative for weakness and numbness.   Allergies  Review of patient's allergies indicates no known allergies.  Home Medications   Current Outpatient Rx  Name  Route  Sig  Dispense  Refill  . GABAPENTIN 100 MG PO CAPS   Oral   Take 100 mg by mouth daily.         Marland Kitchen HYDROCHLOROTHIAZIDE 25 MG PO TABS   Oral   Take 1 tablet (25 mg total) by mouth daily.   60 tablet   6   . MELOXICAM 15 MG PO TABS   Oral   Take 1 tablet (15 mg total) by mouth daily.   30 tablet   1   . METHOCARBAMOL 500 MG PO TABS   Oral   Take 1 tablet (500 mg total) by mouth 3 (three) times daily as needed. For muscle spasms   90 tablet   2   . ADULT MULTIVITAMIN W/MINERALS CH   Oral   Take 1 tablet by mouth daily.         . TRAMADOL HCL 50 MG PO TABS   Oral  Take 1 tablet (50 mg total) by mouth every 8 (eight) hours as needed. For pain   90 tablet   2    BP 163/91  Pulse 70  Temp 98.4 F (36.9 C) (Oral)  Resp 18  SpO2 97%  Physical Exam  Nursing note and vitals reviewed. Constitutional: She appears well-developed and well-nourished. No distress.  HENT:  Head: Normocephalic and atraumatic.  Right Ear: External ear normal.  Left Ear: External ear normal.  Eyes: Conjunctivae normal are normal. Right eye exhibits no discharge. Left eye exhibits no discharge. No scleral icterus.  Neck: Neck supple. No tracheal deviation present.  Cardiovascular: Normal rate.   Pulmonary/Chest: Effort normal. No stridor. No respiratory distress.  Musculoskeletal: She exhibits tenderness. She exhibits no edema.       Lumbar back: She exhibits no tenderness.       Right upper leg: She exhibits tenderness (to  palpation). She exhibits no edema.       No lumbar spine tenderness.  Negative straight leg raise on the right. Strong DP pulse in bilateral lower extremities. Tenderness to palpation of the right thigh. Mild pain with ROM. No edema.  Neurological: She is alert. Cranial nerve deficit: no gross deficits.  Skin: Skin is warm and dry. No rash noted.  Psychiatric: She has a normal mood and affect.   ED Course  Procedures (including critical care time) DIAGNOSTIC STUDIES: Oxygen Saturation is 97% on room air, adequate by my interpretation.    COORDINATION OF CARE: 7:30 PM Discussed treatment plan with pt at bedside and pt agreed to plan.  No results found.   No diagnosis found.  MDM  Doubt sciatica.  Will check xrays of hip and thigh .  Recc follow up with sports medicine/ortho. medications.   I personally performed the services described in this documentation, which was scribed in my presence.  The recorded information has been reviewed and considered.    Celene Kras, MD 12/30/11 2018

## 2011-12-31 NOTE — ED Provider Notes (Signed)
Medical screening examination/treatment/procedure(s) were conducted as a shared visit with non-physician practitioner(s) and myself.  I personally evaluated the patient during the encounter   Celene Kras, MD 12/31/11 1506

## 2012-01-04 ENCOUNTER — Other Ambulatory Visit: Payer: Self-pay | Admitting: Internal Medicine

## 2012-01-29 ENCOUNTER — Ambulatory Visit: Payer: Self-pay | Admitting: Radiation Oncology

## 2012-03-09 ENCOUNTER — Ambulatory Visit: Payer: Self-pay | Admitting: Internal Medicine

## 2012-03-17 ENCOUNTER — Encounter: Payer: Self-pay | Admitting: Internal Medicine

## 2012-03-17 ENCOUNTER — Ambulatory Visit (INDEPENDENT_AMBULATORY_CARE_PROVIDER_SITE_OTHER): Payer: Self-pay | Admitting: Internal Medicine

## 2012-03-17 VITALS — BP 130/80 | HR 74 | Temp 97.2°F | Ht 59.0 in | Wt 125.4 lb

## 2012-03-17 DIAGNOSIS — M545 Low back pain, unspecified: Secondary | ICD-10-CM

## 2012-03-17 DIAGNOSIS — M549 Dorsalgia, unspecified: Secondary | ICD-10-CM

## 2012-03-17 DIAGNOSIS — Z0000 Encounter for general adult medical examination without abnormal findings: Secondary | ICD-10-CM

## 2012-03-17 DIAGNOSIS — E785 Hyperlipidemia, unspecified: Secondary | ICD-10-CM

## 2012-03-17 DIAGNOSIS — Z23 Encounter for immunization: Secondary | ICD-10-CM

## 2012-03-17 DIAGNOSIS — I10 Essential (primary) hypertension: Secondary | ICD-10-CM

## 2012-03-17 LAB — LIPID PANEL
HDL: 53 mg/dL (ref >39)
LDL Cholesterol: 94 mg/dL (ref 0–99)
Total CHOL/HDL Ratio: 3.1 Ratio
Triglycerides: 82 mg/dL (ref <150)
VLDL: 16 mg/dL (ref 0–40)

## 2012-03-17 MED ORDER — ACETAMINOPHEN 325 MG PO TABS: 650.0000 mg | ORAL_TABLET | Freq: Three times a day (TID) | ORAL | Status: DC

## 2012-03-17 MED ORDER — CYCLOBENZAPRINE HCL 5 MG PO TABS: 5.0000 mg | ORAL_TABLET | Freq: Every day | ORAL | Status: DC

## 2012-03-17 MED ORDER — TRAMADOL HCL 50 MG PO TABS: 50.0000 mg | ORAL_TABLET | Freq: Four times a day (QID) | ORAL | Status: DC | PRN

## 2012-03-17 MED ORDER — MELOXICAM 7.5 MG PO TABS: 7.5000 mg | ORAL_TABLET | Freq: Every day | ORAL | Status: DC

## 2012-03-17 NOTE — Assessment & Plan Note (Addendum)
Patient's back pain is chronic, most likely due to degenerative disc disease and paraspinal muscles spasm. Currently she doesn't have alarming symptoms. Her worsening symptoms is most likely due to noncompliance to medications.  -will restart Mobic, Tylenol and tramadol -Will let patient to take Flexeril only at night -Discontinue Robaxin.

## 2012-03-17 NOTE — Assessment & Plan Note (Signed)
BP Readings from Last 3 Encounters:  03/17/12 130/80  12/30/11 163/91  11/16/11 147/87    Lab Results  Component Value Date   NA 143 08/31/2011   K 3.5 08/31/2011   CREATININE 0.71 08/31/2011    Assessment:  Blood pressure control: controlled  Progress toward BP goal:  at goal  Comments:   Plan:  Medications:  continue current medications  Educational resources provided: brochure  Self management tools provided: home blood pressure logbook  Other plans: Blood pressure is well controlled. Today blood pressure is normal. Will continue current regimen.

## 2012-03-17 NOTE — Patient Instructions (Addendum)
1. Please start taking Mobic and Tylenol from now. Please take Flexeril at night. 2. Please take all medications as prescribed.  3. If you have worsening of your symptoms or new symptoms arise, please call the clinic ([phone]), or go to the ER immediately if symptoms are severe.  Back Pain in Pregnancy Back pain during pregnancy is common. It happens in about half of all pregnancies. It is important for you and your baby that you remain active during your pregnancy.If you feel that back pain is not allowing you to remain active or sleep well, it is time to see your caregiver. Back pain may be caused by several factors related to changes during your pregnancy.Fortunately, unless you had trouble with your back before your pregnancy, the pain is likely to get better after you deliver. Low back pain usually occurs between the fifth and seventh months of pregnancy. It can, however, happen in the first couple months. Factors that increase the risk of back problems include:   Previous back problems.  Injury to your back.  Having twins or multiple births.  A chronic cough.  Stress.  Job-related repetitive motions.  Muscle or spinal disease in the back.  Family history of back problems, ruptured (herniated) discs, or osteoporosis.  Depression, anxiety, and panic attacks. CAUSES   When you are pregnant, your body produces a hormone called relaxin. This hormonemakes the ligaments connecting the low back and pubic bones more flexible. This flexibility allows the baby to be delivered more easily. When your ligaments are loose, your muscles need to work harder to support your back. Soreness in your back can come from tired muscles. Soreness can also come from back tissues that are irritated since they are receiving less support.  As the baby grows, it puts pressure on the nerves and blood vessels in your pelvis. This can cause back pain.  As the baby grows and gets heavier during pregnancy, the  uterus pushes the stomach muscles forward and changes your center of gravity. This makes your back muscles work harder to maintain good posture. SYMPTOMS  Lumbar pain during pregnancy Lumbar pain during pregnancy usually occurs at or above the waist in the center of the back. There may be pain and numbness that radiates into your leg or foot. This is similar to low back pain experienced by non-pregnant women. It usually increases with sitting for long periods of time, standing, or repetitive lifting. Tenderness may also be present in the muscles along your upper back. Posterior pelvic pain during pregnancy Pain in the back of the pelvis is more common than lumbar pain in pregnancy. It is a deep pain felt in your side at the waistline, or across the tailbone (sacrum), or in both places. You may have pain on one or both sides. This pain can also go into the buttocks and backs of the upper thighs. Pubic and groin pain may also be present. The pain does not quickly resolve with rest, and morning stiffness may also be present. Pelvic pain during pregnancy can be brought on by most activities. A high level of fitness before and during pregnancy may or may not prevent this problem. Labor pain is usually 1 to 2 minutes apart, lasts for about 1 minute, and involves a bearing down feeling or pressure in your pelvis. However, if you are at term with the pregnancy, constant low back pain can be the beginning of early labor, and you should be aware of this. DIAGNOSIS  X-rays of the back  should not be done during the first 12 to 14 weeks of the pregnancy and only when absolutely necessary during the rest of the pregnancy. MRIs do not give off radiation and are safe during pregnancy. MRIs also should only be done when absolutely necessary. HOME CARE INSTRUCTIONS  Exercise as directed by your caregiver. Exercise is the most effective way to prevent or manage back pain. If you have a back problem, it is especially  important to avoid sports that require sudden body movements. Swimming and walking are great activities.  Do not stand in one place for long periods of time.  Do not wear high heels.  Sit in chairs with good posture. Use a pillow on your lower back if necessary. Make sure your head rests over your shoulders and is not hanging forward.  Try sleeping on your side, preferably the left side, with a pillow or two between your legs. If you are sore after a night's rest, your bedmay betoo soft.Try placing a board between your mattress and box spring.  Listen to your body when lifting.If you are experiencing pain, ask for help or try bending yourknees more so you can use your leg muscles rather than your back muscles. Squat down when picking up something from the floor. Do not bend over.  Eat a healthy diet. Try to gain weight within your caregiver's recommendations.  Use heat or cold packs 3 to 4 times a day for 15 minutes to help with the pain.  Only take over-the-counter or prescription medicines for pain, discomfort, or fever as directed by your caregiver. Sudden (acute) back pain  Use bed rest for only the most extreme, acute episodes of back pain. Prolonged bed rest over 48 hours will aggravate your condition.  Ice is very effective for acute conditions.  Put ice in a plastic bag.  Place a towel between your skin and the bag.  Leave the ice on for 10 to 20 minutes every 2 hours, or as needed.  Using heat packs for 30 minutes prior to activities is also helpful. Continued back pain See your caregiver if you have continued problems. Your caregiver can help or refer you for appropriate physical therapy. With conditioning, most back problems can be avoided. Sometimes, a more serious issue may be the cause of back pain. You should be seen right away if new problems seem to be developing. Your caregiver may recommend:  A maternity girdle.  An elastic sling.  A back brace.  A  massage therapist or acupuncture. SEEK MEDICAL CARE IF:   You are not able to do most of your daily activities, even when taking the pain medicine you were given.  You need a referral to a physical therapist or chiropractor.  You want to try acupuncture. SEEK IMMEDIATE MEDICAL CARE IF:  You develop numbness, tingling, weakness, or problems with the use of your arms or legs.  You develop severe back pain that is no longer relieved with medicines.  You have a sudden change in bowel or bladder control.  You have increasing pain in other areas of the body.  You develop shortness of breath, dizziness, or fainting.  You develop nausea, vomiting, or sweating.  You have back pain which is similar to labor pains.  You have back pain along with your water breaking or vaginal bleeding.  You have back pain or numbness that travels down your leg.  Your back pain developed after you fell.  You develop pain on one side of your  back. You may have a kidney stone.  You see blood in your urine. You may have a bladder infection or kidney stone.  You have back pain with blisters. You may have shingles. Back pain is fairly common during pregnancy but should not be accepted as just part of the process. Back pain should always be treated as soon as possible. This will make your pregnancy as pleasant as possible. Document Released: 05/20/2005 Document Revised: 05/04/2011 Document Reviewed: 07/01/2010 [facility] Patient Information 2013 [place], [place].

## 2012-03-17 NOTE — Assessment & Plan Note (Signed)
Patient's LDL was 124 on 01/23/09. She is currently not taking any medications for hyperlipidemia. We'll recheck her lipid profile today.

## 2012-03-17 NOTE — Progress Notes (Signed)
Patient ID: Toni Gutierrez, female   DOB: October 12, 1960, 52 y.o.   MRN: [id number] Subjective:   Patient ID: Toni Gutierrez female   DOB: 1960/09/08 52 y.o.   MRN: [id number]  CC:  Acute visit, back pain HPI:  Ms.Toni Gutierrez is a 52 y.o. lady with past medical history as outlined below, who presents for an acute visit.  Patient has chronic back pain for more than 5 years. Patient had a negative right hip x-ray on 12/30/11. Her previous lumbar x-rays showed degenerative disc disease. Her back pain was controlled with Mobic and  tramadol in the past. Recently she stopped taking her medications for more than 2 month. Her back pain gradually comes back. Currently she is not taking any medications. She said in the past she tried Robaxin which gave her muscle spasm. She also tried Flexeril which made her sleepy in the daytime.  Currently her back pain is located at the lower back. It is 7/10 in severity, sharp, radiating down to her lateral thigh, does not cross knee joint. It is aggravated by long standing and alleviated with pain pills. She does not have incontinence. She does not have saddle anesthesia symptoms. She does not have fever or chills.  Past Medical History  Diagnosis Date  . Hypertension   . Hyperlipidemia   . Shoulder pain, bilateral     MRI right shoulder showing tear of infraspinatus tendon with bursal surface fraying of the infraspinatus  . Chronic lower back pain     MRI of cervical, lumbar spine (all obtained by Dr. Sherlean Foot in 2006)- showing mild DDD with bulges but without disc herniation, at multiple levels; mild facet joint dz, primarily at L3-4 and L4-5 bilaterally  . Left knee pain     s/p ACL reconstruction by Dr. Sherlean Foot  . Carpal tunnel syndrome, right   . Alcohol abuse   . Chronic headache     Thought to be 2/2 cervical DJD vs anxiety. CT head in 2010 without any significant findings  . Allergic rhinitis   . Arthritis    Current Outpatient Prescriptions    Medication Sig Dispense Refill  . hydrochlorothiazide (HYDRODIURIL) 25 MG tablet Take 1 tablet (25 mg total) by mouth daily.  60 tablet  6  . Multiple Vitamin (MULTIVITAMIN WITH MINERALS) TABS Take 1 tablet by mouth daily.      . traMADol (ULTRAM) 50 MG tablet Take 1 tablet (50 mg total) by mouth every 6 (six) hours as needed for pain.  60 tablet  0  . acetaminophen (TYLENOL) 325 MG tablet Take 2 tablets (650 mg total) by mouth every 8 (eight) hours.  90 tablet  5  . cyclobenzaprine (FLEXERIL) 5 MG tablet Take 1 tablet (5 mg total) by mouth at bedtime.  30 tablet  3  . meloxicam (MOBIC) 7.5 MG tablet Take 1 tablet (7.5 mg total) by mouth daily.  30 tablet  3   No family history on file. History   Social History  . Marital Status: Married    Spouse Name: N/A    Number of Children: N/A  . Years of Education: N/A   Social History Main Topics  . Smoking status: Never Smoker   . Smokeless tobacco: None  . Alcohol Use: No  . Drug Use: No  . Sexually Active: None   Other Topics Concern  . None   Social History Narrative  . None    Review of Systems: General: no fevers, chills, no changes in body  weight, no changes in appetite Skin: no rash HEENT: no blurry vision, hearing changes or sore throat Pulm: no dyspnea, coughing, wheezing CV: no chest pain, palpitations, shortness of breath Abd: no nausea/vomiting, abdominal pain, diarrhea/constipation GU: no dysuria, hematuria, polyuria Ext: has back pain Neuro: no weakness, numbness, or tingling  Objective:  Physical Exam: Filed Vitals:   03/17/12 1021  BP: 130/80  Pulse: 74  Temp: 97.2 F (36.2 C)  TempSrc: Oral  Height: 4\' 11"  (1.499 m)  Weight: 125 lb 6.4 oz (56.881 kg)  SpO2: 99%   General: Not in acute distress HEENT: PERRL, EOMI, no scleral icterus, No bruit or JVD Cardiac: S1/S2, RRR, No murmurs, gallops or rubs Pulm: Good air movement bilaterally, Clear to auscultation bilaterally, No rales, wheezing, rhonchi or  rubs. Abd: Soft,  nondistended, nontender, no rebound pain, no organomegaly, BS present Ext: No rashes or edema, 2+DP/PT pulse bilaterally Musculoskeletal: There is tenderness over the midline all for her lower back. There is mild pain over lateral paraspinal muscle area bilaterally.  Skin: no rashes. No skin bruise. Neuro: Alert and oriented X3, cranial nerves II-XII grossly intact, muscle strength 5/5 in all extremeties,  sensation to light touch intact. Brachial reflexes 1+ bilaterally. Knee reflexes 2+ bilaterally. Leg raise test is negative. Psych: patient is not psychotic, no suicidal or hemocidal ideation.    Assessment & Plan:

## 2012-03-17 NOTE — Assessment & Plan Note (Signed)
-  Will give flu shot today. -Patient would like to postpone Pap smear, colonoscopy and a mammogram.

## 2012-04-28 ENCOUNTER — Other Ambulatory Visit: Payer: Self-pay | Admitting: *Deleted

## 2012-04-28 DIAGNOSIS — M549 Dorsalgia, unspecified: Secondary | ICD-10-CM

## 2012-04-29 MED ORDER — TRAMADOL HCL 50 MG PO TABS: 50.0000 mg | ORAL_TABLET | Freq: Four times a day (QID) | ORAL | Status: DC | PRN

## 2012-05-02 NOTE — Telephone Encounter (Signed)
Rx called in 

## 2012-06-27 ENCOUNTER — Other Ambulatory Visit: Payer: Self-pay | Admitting: Internal Medicine

## 2012-06-27 DIAGNOSIS — M549 Dorsalgia, unspecified: Secondary | ICD-10-CM

## 2012-06-29 ENCOUNTER — Other Ambulatory Visit: Payer: Self-pay | Admitting: Internal Medicine

## 2012-06-29 MED ORDER — TRAMADOL HCL 50 MG PO TABS: 50.0000 mg | ORAL_TABLET | Freq: Four times a day (QID) | ORAL | Status: DC | PRN

## 2012-06-30 NOTE — Telephone Encounter (Signed)
Ordered the refill to be called in 06/29/12

## 2012-08-12 ENCOUNTER — Ambulatory Visit: Payer: Self-pay | Admitting: Internal Medicine

## 2012-08-12 ENCOUNTER — Encounter: Payer: Self-pay | Admitting: Internal Medicine

## 2012-08-12 ENCOUNTER — Ambulatory Visit (INDEPENDENT_AMBULATORY_CARE_PROVIDER_SITE_OTHER): Payer: Self-pay | Admitting: Internal Medicine

## 2012-08-12 VITALS — BP 121/80 | HR 60 | Temp 97.4°F | Ht 59.75 in | Wt 129.7 lb

## 2012-08-12 DIAGNOSIS — R1031 Right lower quadrant pain: Secondary | ICD-10-CM | POA: Insufficient documentation

## 2012-08-12 DIAGNOSIS — M545 Low back pain: Secondary | ICD-10-CM

## 2012-08-12 DIAGNOSIS — G8929 Other chronic pain: Secondary | ICD-10-CM | POA: Insufficient documentation

## 2012-08-12 LAB — CBC WITH DIFFERENTIAL/PLATELET
Basophils Relative: 1 % (ref 0–1)
Eosinophils Absolute: 0.1 10*3/uL (ref 0.0–0.7)
HCT: 39.8 % (ref 36.0–46.0)
Hemoglobin: 13.6 g/dL (ref 12.0–15.0)
Lymphs Abs: 2.2 10*3/uL (ref 0.7–4.0)
MCH: 30.1 pg (ref 26.0–34.0)
MCHC: 34.2 g/dL (ref 30.0–36.0)
Monocytes Absolute: 0.4 10*3/uL (ref 0.1–1.0)
Monocytes Relative: 8 % (ref 3–12)
RBC: 4.52 MIL/uL (ref 3.87–5.11)

## 2012-08-12 LAB — POCT GLYCOSYLATED HEMOGLOBIN (HGB A1C): Hemoglobin A1C: 5

## 2012-08-12 LAB — COMPLETE METABOLIC PANEL WITH GFR
ALT: 22 U/L (ref 0–35)
AST: 23 U/L (ref 0–37)
Alkaline Phosphatase: 74 U/L (ref 39–117)
Creat: 0.78 mg/dL (ref 0.50–1.10)
Sodium: 142 mEq/L (ref 135–145)
Total Bilirubin: 0.4 mg/dL (ref 0.3–1.2)
Total Protein: 7.5 g/dL (ref 6.0–8.3)

## 2012-08-12 LAB — TSH: TSH: 1.038 u[IU]/mL (ref 0.350–4.500)

## 2012-08-12 NOTE — Patient Instructions (Addendum)
1. Please pick the Rx Tramadol prescribed by Dr. Tonny Branch. 2. Please arrange meeting with Stanton Kidney for [facility] card application 3. Will send sports medicine referral once your have orange card.

## 2012-08-12 NOTE — Progress Notes (Addendum)
Subjective:   Patient ID: Toni Gutierrez female   DOB: 1960/03/17 52 y.o.   MRN: [id number]  HPI: Ms.Toni Gutierrez is a 53 y.o. woman with PMH of HTN, HLD, Chronic lower back, shoulder and left knee pain, chronic headache, and arthritis, who presents to the clinic for chronic back pain.  Patient reports 7-8 year intermittent chronic back pain with radiating pain along her lateral thigh area. She states that she used to work at [employer] and her job involved a lot of heavy lifting ~ 30 lbs. She was evaluated and treated with Mobic,Tylenol, Flexeril and tramadol in the past. She reports that she is out of her medications. She is here for evaluation.   Of note, she also reports feeling of abdomen fullness after meals.  Denies nausea, vomiting or abdominal pain. Last LMP was in January 2014.  She would like to have pregnancy test done today. she does not have insurance or orange card. She does not want to have sports medicine referral before she can apply the orange card.   Past Medical History  Diagnosis Date  . Hypertension   . Hyperlipidemia   . Shoulder pain, bilateral     MRI right shoulder showing tear of infraspinatus tendon with bursal surface fraying of the infraspinatus  . Chronic lower back pain     MRI of cervical, lumbar spine (all obtained by Dr. Sherlean Foot in 2006)- showing mild DDD with bulges but without disc herniation, at multiple levels; mild facet joint dz, primarily at L3-4 and L4-5 bilaterally  . Left knee pain     s/p ACL reconstruction by Dr. Sherlean Foot  . Carpal tunnel syndrome, right   . Alcohol abuse   . Chronic headache     Thought to be 2/2 cervical DJD vs anxiety. CT head in 2010 without any significant findings  . Allergic rhinitis   . Arthritis    Current Outpatient Prescriptions  Medication Sig Dispense Refill  . acetaminophen (TYLENOL) 325 MG tablet Take 2 tablets (650 mg total) by mouth every 8 (eight) hours.  90 tablet  5  . cyclobenzaprine (FLEXERIL) 5 MG  tablet Take 1 tablet (5 mg total) by mouth at bedtime.  30 tablet  3  . hydrochlorothiazide (HYDRODIURIL) 25 MG tablet Take 1 tablet (25 mg total) by mouth daily.  60 tablet  6  . Multiple Vitamin (MULTIVITAMIN WITH MINERALS) TABS Take 1 tablet by mouth daily.      . traMADol (ULTRAM) 50 MG tablet Take 1 tablet (50 mg total) by mouth every 6 (six) hours as needed for pain.  60 tablet  1  . meloxicam (MOBIC) 7.5 MG tablet Take 1 tablet (7.5 mg total) by mouth daily.  30 tablet  3   No current facility-administered medications for this visit.   No family history on file. History   Social History  . Marital Status: Married    Spouse Name: N/A    Number of Children: N/A  . Years of Education: N/A   Social History Main Topics  . Smoking status: Never Smoker   . Smokeless tobacco: None  . Alcohol Use: No  . Drug Use: No  . Sexually Active: None   Other Topics Concern  . None   Social History Narrative  . None   Review of Systems:  Constitutional:  Denies fever, chills, diaphoresis, appetite change and fatigue.   HEENT:  Denies congestion, sore throat, rhinorrhea, sneezing, mouth sores, trouble swallowing, neck pain   Respiratory:  Denies  SOB, DOE, cough, and wheezing.   Cardiovascular:  Denies palpitations and leg swelling.   Gastrointestinal:  Denies nausea, vomiting, abdominal pain, diarrhea, constipation, blood in stool and abdominal distention.   Genitourinary:  Denies dysuria, urgency, frequency, hematuria, flank pain and difficulty urinating.   Musculoskeletal:  Denies myalgias,  joint swelling, arthralgias and gait problem. positive for back pain.    Skin:  Denies pallor, rash and wound.   Neurological:  Denies dizziness, seizures, syncope, weakness, light-headedness, numbness and headaches.      Objective:  Physical Exam: Filed Vitals:   08/12/12 1146  BP: 121/80  Pulse: 60  Temp: 97.4 F (36.3 C)  TempSrc: Oral  Height: 4' 11.75" (1.518 m)  Weight: 129 lb 11.2  oz (58.832 kg)  SpO2: 98%   General: alert, well-developed, and cooperative to examination.  Head: normocephalic and atraumatic.  Eyes: vision grossly intact, pupils equal, pupils round, pupils reactive to light, no injection and anicteric.  Mouth: pharynx pink and moist, no erythema, and no exudates.  Neck: supple, full ROM, no thyromegaly, no JVD, and no carotid bruits.  Lungs: normal respiratory effort, no accessory muscle use, normal breath sounds, no crackles, and no wheezes. Heart: normal rate, regular rhythm, no murmur, no gallop, and no rub.  Abdomen: soft, non-tender, normal bowel sounds, no distention, no guarding, no rebound tenderness, no hepatomegaly, and no splenomegaly.  Msk: no joint swelling, no joint warmth, and no redness over joints. Paraspinal tenderness noted on lumbar area. Negative straight leg raising test.  Pulses: 2+ DP/PT pulses bilaterally Extremities: No cyanosis, clubbing, edema Neurologic: alert & oriented X3, cranial nerves II-XII intact, strength normal in all extremities, sensation intact to light touch, and gait normal.  Skin: turgor normal and no rashes.  Psych: Oriented X3, memory intact for recent and remote, normally interactive, good eye contact, not anxious appearing, and not depressed appearing.       Assessment & Plan:

## 2012-08-12 NOTE — Assessment & Plan Note (Signed)
Patient presents to the clinic for medication refill. She reports that she would like to have stronger pain medication for her chronic back pain. Denies worsening of her back pain. Physical examination reveals localized paraspinal tenderness to palpation without focal neurological deficit.  Negative straight leg raising test.  - Her clinical manifestation is consistent with lumbar back muscular pain versus mild radiculopathy.  No focal neuro deficit.   - Will suggest her to continue take the current regimen including Tylenol, Flexeril and tramadol.  She'll benefit from sports medicine evaluation and possible physical therapy.  However, she doesn't have the orange card and is supposed to meet with Gavin Pound for application, which she has not arranged yet.  She does not want to be referred to sports medicine for any evaluation before her application.  - she already has refills of tramadol by Dr. Tonny Branch in May 2014. She is instructed to pick her Rx.

## 2012-08-15 NOTE — Progress Notes (Signed)
Case discussed with Dr. Li soon after the resident saw the patient. We reviewed the resident's history and exam and pertinent patient test results. I agree with the assessment, diagnosis, and plan of care documented in the resident's note. 

## 2012-08-16 ENCOUNTER — Ambulatory Visit (INDEPENDENT_AMBULATORY_CARE_PROVIDER_SITE_OTHER): Payer: Self-pay | Admitting: Internal Medicine

## 2012-08-16 DIAGNOSIS — E876 Hypokalemia: Secondary | ICD-10-CM | POA: Insufficient documentation

## 2012-08-16 MED ORDER — POTASSIUM CHLORIDE ER 10 MEQ PO TBCR: 40.0000 meq | EXTENDED_RELEASE_TABLET | Freq: Every day | ORAL | Status: DC

## 2012-08-16 NOTE — Assessment & Plan Note (Signed)
K 3.0 on 08/12/12. Patient is on HCTZ.  - will send the Rx for K dur 40 Meg po daily x 2 days.

## 2012-08-18 NOTE — Assessment & Plan Note (Signed)
Her symptoms are consistent with IT band syndrome and she is remarkable inflexible for someone who states she just finished PT.  We will have her continue her home stretching exercises and restart the Robaxin.  She will continue the Mobic, ice, heat, and rehab to help her pain.

## 2012-08-18 NOTE — Assessment & Plan Note (Signed)
She states that she has been taking her medications including HCTZ for her blood pressure. She denies side effects.  BP Readings from Last 3 Encounters:  11/16/11 147/87  08/31/11 146/93  08/08/11 129/81   Blood pressure is below her goal of <140/90.  We will continue her medication and continue to monitor.

## 2012-08-22 NOTE — Progress Notes (Signed)
This is an order only encounter

## 2012-10-18 ENCOUNTER — Encounter (HOSPITAL_COMMUNITY): Payer: Self-pay | Admitting: *Deleted

## 2012-10-18 ENCOUNTER — Emergency Department (HOSPITAL_COMMUNITY)
Admission: EM | Admit: 2012-10-18 | Discharge: 2012-10-18 | Disposition: A | Discharge status: Home / Self Care | Payer: Self-pay | Attending: Emergency Medicine | Admitting: Emergency Medicine

## 2012-10-18 DIAGNOSIS — M129 Arthropathy, unspecified: Secondary | ICD-10-CM | POA: Insufficient documentation

## 2012-10-18 DIAGNOSIS — R1013 Epigastric pain: Secondary | ICD-10-CM | POA: Insufficient documentation

## 2012-10-18 DIAGNOSIS — Z8669 Personal history of other diseases of the nervous system and sense organs: Secondary | ICD-10-CM | POA: Insufficient documentation

## 2012-10-18 DIAGNOSIS — Z8639 Personal history of other endocrine, nutritional and metabolic disease: Secondary | ICD-10-CM | POA: Insufficient documentation

## 2012-10-18 DIAGNOSIS — Z862 Personal history of diseases of the blood and blood-forming organs and certain disorders involving the immune mechanism: Secondary | ICD-10-CM | POA: Insufficient documentation

## 2012-10-18 DIAGNOSIS — K219 Gastro-esophageal reflux disease without esophagitis: Secondary | ICD-10-CM | POA: Insufficient documentation

## 2012-10-18 DIAGNOSIS — Z8679 Personal history of other diseases of the circulatory system: Secondary | ICD-10-CM | POA: Insufficient documentation

## 2012-10-18 DIAGNOSIS — M545 Low back pain, unspecified: Secondary | ICD-10-CM | POA: Insufficient documentation

## 2012-10-18 DIAGNOSIS — M79609 Pain in unspecified limb: Secondary | ICD-10-CM | POA: Insufficient documentation

## 2012-10-18 DIAGNOSIS — R112 Nausea with vomiting, unspecified: Secondary | ICD-10-CM | POA: Insufficient documentation

## 2012-10-18 DIAGNOSIS — R12 Heartburn: Secondary | ICD-10-CM | POA: Insufficient documentation

## 2012-10-18 DIAGNOSIS — Z79899 Other long term (current) drug therapy: Secondary | ICD-10-CM | POA: Insufficient documentation

## 2012-10-18 DIAGNOSIS — G8929 Other chronic pain: Secondary | ICD-10-CM

## 2012-10-18 DIAGNOSIS — I10 Essential (primary) hypertension: Secondary | ICD-10-CM | POA: Insufficient documentation

## 2012-10-18 LAB — BASIC METABOLIC PANEL
CO2: 27 mEq/L (ref 19–32)
Calcium: 9.4 mg/dL (ref 8.4–10.5)
Creatinine, Ser: 0.67 mg/dL (ref 0.50–1.10)
GFR calc non Af Amer: >90 mL/min (ref >90)
Sodium: 142 mEq/L (ref 135–145)

## 2012-10-18 LAB — CBC
MCH: 31.3 pg (ref 26.0–34.0)
MCHC: 35.4 g/dL (ref 30.0–36.0)
MCV: 88.4 fL (ref 78.0–100.0)
Platelets: 269 10*3/uL (ref 150–400)
RBC: 4.22 MIL/uL (ref 3.87–5.11)

## 2012-10-18 LAB — POCT I-STAT TROPONIN I: Troponin i, poc: 0.01 ng/mL (ref 0.00–0.08)

## 2012-10-18 MED ORDER — POTASSIUM CHLORIDE CRYS ER 20 MEQ PO TBCR
60.0000 meq | EXTENDED_RELEASE_TABLET | Freq: Once | ORAL | Status: AC
  Administered 2012-10-18: 60 meq via ORAL
  Filled 2012-10-18: qty 3

## 2012-10-18 MED ORDER — HYDROCODONE-ACETAMINOPHEN 5-325 MG PO TABS: 1.0000 | ORAL_TABLET | Freq: Four times a day (QID) | ORAL | Status: DC | PRN

## 2012-10-18 MED ORDER — METHOCARBAMOL 500 MG PO TABS: 500.0000 mg | ORAL_TABLET | Freq: Two times a day (BID) | ORAL | Status: DC

## 2012-10-18 MED ORDER — IBUPROFEN 400 MG PO TABS: 400.0000 mg | ORAL_TABLET | Freq: Four times a day (QID) | ORAL | Status: DC | PRN

## 2012-10-18 MED ORDER — RANITIDINE HCL 300 MG PO TABS: 300.0000 mg | ORAL_TABLET | Freq: Every day | ORAL | Status: DC

## 2012-10-18 MED ORDER — ALUM & MAG HYDROXIDE-SIMETH 200-200-20 MG/5ML PO SUSP
30.0000 mL | Freq: Once | ORAL | Status: AC
  Administered 2012-10-18: 30 mL via ORAL
  Filled 2012-10-18: qty 30

## 2012-10-18 NOTE — ED Provider Notes (Signed)
CSN: [id number]     Arrival date & time 10/18/12  1345 History   First MD Initiated Contact with Patient 10/18/12 1720     Chief Complaint  Patient presents with  . Back Pain  . Leg Pain  . Heartburn   (Consider location/radiation/quality/duration/timing/severity/associated sxs/prior Treatment) Patient is a 52 y.o. female presenting with back pain and heartburn. The history is provided by the patient.  Back Pain Location:  Lumbar spine Quality:  Aching Radiates to:  R thigh Pain severity:  Moderate Pain is:  Same all the time Onset quality:  Gradual Duration:  1 day Timing:  Constant Progression:  Unchanged Chronicity: acute on chronic. Context: not falling, not recent illness and not recent injury   Relieved by:  NSAIDs Worsened by:  Movement Ineffective treatments:  None tried Associated symptoms: abdominal pain   Associated symptoms: no abdominal swelling, no bladder incontinence, no bowel incontinence, no chest pain, no dysuria, no fever, no numbness, no paresthesias, no perianal numbness, no tingling and no weakness   Abdominal pain:    Location:  Epigastric   Quality:  Burning   Severity:  Mild   Onset quality:  Gradual   Duration:  2 months   Timing:  Intermittent   Progression:  Unchanged   Chronicity:  Chronic Heartburn This is a chronic problem. Episode onset: 2 months ago. The problem occurs intermittently. The problem has been unchanged. Associated symptoms include abdominal pain, nausea and vomiting. Pertinent negatives include no change in bowel habit, chest pain, chills, coughing, diaphoresis, fatigue, fever, numbness, rash, urinary symptoms or weakness. The symptoms are aggravated by eating. Treatments tried: OTC reflux medications. The treatment provided no relief.    Past Medical History  Diagnosis Date  . Hypertension   . Hyperlipidemia   . Shoulder pain, bilateral     MRI right shoulder showing tear of infraspinatus tendon with bursal surface fraying  of the infraspinatus  . Chronic lower back pain     MRI of cervical, lumbar spine (all obtained by Dr. Sherlean Foot in 2006)- showing mild DDD with bulges but without disc herniation, at multiple levels; mild facet joint dz, primarily at L3-4 and L4-5 bilaterally  . Left knee pain     s/p ACL reconstruction by Dr. Sherlean Foot  . Carpal tunnel syndrome, right   . Alcohol abuse   . Chronic headache     Thought to be 2/2 cervical DJD vs anxiety. CT head in 2010 without any significant findings  . Allergic rhinitis   . Arthritis    Past Surgical History  Procedure Laterality Date  . Tubal ligation  1989  . Anterior cruciate ligament repair      By Dr. Sherlean Foot  . Rotator cuff repair      Right shoulder   No family history on file. History  Substance Use Topics  . Smoking status: Never Smoker   . Smokeless tobacco: Not on file  . Alcohol Use: No   OB History   Grav Para Term Preterm Abortions TAB SAB Ect Mult Living                 Review of Systems  Constitutional: Negative for fever, chills, diaphoresis, appetite change and fatigue.  Respiratory: Negative for cough, chest tightness, shortness of breath and wheezing.   Cardiovascular: Negative for chest pain, palpitations and leg swelling.  Gastrointestinal: Positive for heartburn, nausea, vomiting and abdominal pain. Negative for diarrhea, abdominal distention, change in bowel habit and bowel incontinence.  Genitourinary: Negative for  bladder incontinence, dysuria, urgency, frequency, flank pain, decreased urine volume, enuresis and difficulty urinating.  Musculoskeletal: Positive for back pain.  Skin: Negative for rash.  Neurological: Negative for dizziness, tingling, syncope, weakness, light-headedness, numbness and paresthesias.  All other systems reviewed and are negative.    Allergies  Review of patient's allergies indicates no known allergies.  Home Medications   Current Outpatient Rx  Name  Route  Sig  Dispense  Refill  .  HYDROcodone-acetaminophen (NORCO/VICODIN) 5-325 MG per tablet   Oral   Take 1 tablet by mouth every 6 (six) hours as needed for pain.   15 tablet   0   . ibuprofen (ADVIL,MOTRIN) 400 MG tablet   Oral   Take 1 tablet (400 mg total) by mouth every 6 (six) hours as needed for pain.   30 tablet   0   . methocarbamol (ROBAXIN) 500 MG tablet   Oral   Take 1 tablet (500 mg total) by mouth 2 (two) times daily.   20 tablet   0   . ranitidine (ZANTAC) 300 MG tablet   Oral   Take 1 tablet (300 mg total) by mouth at bedtime.   30 tablet   0    BP 157/88  Pulse 69  Temp(Src) 98.4 F (36.9 C) (Oral)  Resp 20  SpO2 98% Physical Exam  Nursing note and vitals reviewed. Constitutional: She is oriented to person, place, and time. She appears well-developed and well-nourished. No distress.  HENT:  Head: Normocephalic and atraumatic.  Eyes: EOM are normal. Pupils are equal, round, and reactive to light.  Neck: Normal range of motion. Neck supple.  Cardiovascular: Normal rate, regular rhythm, normal heart sounds and intact distal pulses.   Pulmonary/Chest: Effort normal and breath sounds normal. No respiratory distress. She has no wheezes. She has no rales.  Abdominal: Soft. Bowel sounds are normal. She exhibits no distension. There is no tenderness. There is no rebound and no guarding.  Musculoskeletal: Normal range of motion. She exhibits no edema.       Lumbar back: She exhibits tenderness. She exhibits no bony tenderness, no edema and no deformity.  Lumbar paraspinal TTP Negative straight leg raise test  Neurological: She is alert and oriented to person, place, and time. She has normal strength. No sensory deficit. She exhibits normal muscle tone. Coordination and gait normal.  Strength 5/5 in b/l LE and sensation intact bilaterally  Skin: Skin is warm and dry. She is not diaphoretic.    ED Course  Procedures (including critical care time) Labs Review Labs Reviewed  BASIC  METABOLIC PANEL - Abnormal; Notable for the following:    Potassium 2.9 (*)    All other components within normal limits  CBC  LIPASE, BLOOD  POCT I-STAT TROPONIN I   Imaging Review No results found.   Date: 10/19/2012  Rate: 60  Rhythm: normal sinus rhythm  QRS Axis: normal  Intervals: normal  ST/T Wave abnormalities: normal  Conduction Disutrbances:none  Narrative Interpretation:   Old EKG Reviewed: none available   MDM   1. Chronic back pain   2. GERD (gastroesophageal reflux disease)    52 y.o. F with PMH of chronic low back pain and HTN presenting with low back pain and reflux.  Pt has had chronic back pain for 5 years with MRI 5 years ago showing DDD.  She reports acute worsening today described as low back pain with radiation to R LE.  Denies weakness, numbness, tingling, saddle anesthesia, or bowel/bladder  incontinence or retention.  Symptoms typical of pt's chronic pain.    Pt also reports symptoms of reflux described as burning in epigastrium with N/V.  She reports symptoms for 2 months.  She has tried multiple OTC medications with no relief of symptoms.  She denies chest pain, dyspnea, or dysphagia.  She has not had this evaluated with no prior EGDs.  Symptoms typical of GERD.  Pt denies chest pain or dyspnea, no prior cardiac history.  EKG as above shows NSR with no acute ischemic changes.  Troponin obtained is negative.  Doubt ACS. GI cocktail administered. PO potassium given for hypokalemia on BMP. Will prescribe PPI and advised f/u with PCP- contact info provided for PCP as pt currently does not have one.    On exam, pt AFVSS, in NAD.  Abdomen soft, non-tender, non-distended with no rebound or guarding.  TTP of low back in lumbar paraspinal muscle, R > L.  Negative straight leg raise test.  Strength and sensation in b/l LE intact.  Pt able to ambulate without difficulty.  Doubt cauda equina or other cord compression pathology given history and exam.  No fever or IVDU  history, doubt epidural abscess or osteomyelitis.  Pt prescribed analgesics, NSAID, and muscle relaxer, advised f/u with PCP.  ED return precautions given.  Pt stable at discharge.  Discussed with attending Dr. Rhunette Croft.    Jodean Lima, MD 10/19/12 [phone]

## 2012-10-18 NOTE — ED Notes (Signed)
Pt is here with lower back pain and radiates down legs and has been going on for a while.  Heart burn feeling in chest and into throat and has been getting worse over the last couple of months

## 2012-10-25 NOTE — ED Provider Notes (Signed)
I performed a history and physical examination of  Toni Gutierrez and discussed her management with Dr. Artis Flock. I agree with the history, physical, assessment, and plan of care, with the following exceptions: None I was present for the following procedures: None  Time Spent in Critical Care of the patient: None  Time spent in discussions with the patient and family: 10 minutes  Samarra Ridgely   Derwood Kaplan, MD 10/25/12 1643

## 2012-10-26 ENCOUNTER — Other Ambulatory Visit: Payer: Self-pay | Admitting: *Deleted

## 2012-10-26 DIAGNOSIS — M549 Dorsalgia, unspecified: Secondary | ICD-10-CM

## 2012-10-26 MED ORDER — TRAMADOL HCL 50 MG PO TABS: 50.0000 mg | ORAL_TABLET | Freq: Four times a day (QID) | ORAL | Status: DC | PRN

## 2012-11-17 ENCOUNTER — Encounter: Payer: Self-pay | Admitting: Family Medicine

## 2012-11-17 ENCOUNTER — Ambulatory Visit: Payer: Self-pay | Attending: Internal Medicine | Admitting: Family Medicine

## 2012-11-17 VITALS — BP 127/90 | HR 81 | Temp 98.3°F | Ht 59.0 in | Wt 133.3 lb

## 2012-11-17 DIAGNOSIS — Z23 Encounter for immunization: Secondary | ICD-10-CM

## 2012-11-17 DIAGNOSIS — G8929 Other chronic pain: Secondary | ICD-10-CM | POA: Insufficient documentation

## 2012-11-17 DIAGNOSIS — M5431 Sciatica, right side: Secondary | ICD-10-CM

## 2012-11-17 DIAGNOSIS — K029 Dental caries, unspecified: Secondary | ICD-10-CM | POA: Insufficient documentation

## 2012-11-17 DIAGNOSIS — D649 Anemia, unspecified: Secondary | ICD-10-CM

## 2012-11-17 DIAGNOSIS — M5430 Sciatica, unspecified side: Secondary | ICD-10-CM | POA: Insufficient documentation

## 2012-11-17 DIAGNOSIS — K219 Gastro-esophageal reflux disease without esophagitis: Secondary | ICD-10-CM | POA: Insufficient documentation

## 2012-11-17 DIAGNOSIS — M549 Dorsalgia, unspecified: Secondary | ICD-10-CM | POA: Insufficient documentation

## 2012-11-17 LAB — COMPLETE METABOLIC PANEL WITH GFR
Alkaline Phosphatase: 77 U/L (ref 39–117)
CO2: 31 mEq/L (ref 19–32)
Creat: 0.75 mg/dL (ref 0.50–1.10)
GFR, Est African American: >89 mL/min
GFR, Est Non African American: >89 mL/min
Glucose, Bld: 76 mg/dL (ref 70–99)
Total Bilirubin: 0.3 mg/dL (ref 0.3–1.2)

## 2012-11-17 LAB — LIPID PANEL
HDL: 54 mg/dL (ref >39)
Total CHOL/HDL Ratio: 3.7 Ratio
Triglycerides: 94 mg/dL (ref <150)

## 2012-11-17 LAB — CBC
Hemoglobin: 13.4 g/dL (ref 12.0–15.0)
MCH: 30 pg (ref 26.0–34.0)
MCHC: 34.2 g/dL (ref 30.0–36.0)
MCV: 87.9 fL (ref 78.0–100.0)
Platelets: 323 10*3/uL (ref 150–400)
RBC: 4.46 MIL/uL (ref 3.87–5.11)

## 2012-11-17 MED ORDER — OMEPRAZOLE 40 MG PO CPDR: 40.0000 mg | DELAYED_RELEASE_CAPSULE | Freq: Every day | ORAL | Status: DC

## 2012-11-17 MED ORDER — TRAMADOL HCL 50 MG PO TABS: 50.0000 mg | ORAL_TABLET | Freq: Three times a day (TID) | ORAL | Status: DC | PRN

## 2012-11-17 NOTE — Progress Notes (Signed)
Patient ID: Toni Gutierrez, female   DOB: 12/25/1960, 52 y.o.   MRN: [id number]  CC: GERD and chronic back pain   HPI: Pt says for last 3 months that she has been having a lot of acid reflux pain.  Pt says that she has been taking ranitidine but has not been getting any relief.  She also has chronic low back pain and sciatica.  Patient reports that she's had multiple evaluations done by orthopedics and reports that there is nothing further that they can do for her.  At all for chronic pain management but she hasn't been able to establish care at this time.  Reports no loss of bowel or bladder function.  She recently was evaluated approximately 3 weeks ago in the emergency department.  The patient reports that her main problem is that she's having severe acid reflux symptoms and not able to gain weight or keep food down for long periods of time because of symptoms.  She is having a difficult time being able to obtain the proper diet to control her acid reflux.  She has symptoms of chronic nausea occasional vomiting.  No Known Allergies Past Medical History  Diagnosis Date  . Hypertension   . Hyperlipidemia   . Shoulder pain, bilateral     MRI right shoulder showing tear of infraspinatus tendon with bursal surface fraying of the infraspinatus  . Chronic lower back pain     MRI of cervical, lumbar spine (all obtained by Dr. Sherlean Foot in 2006)- showing mild DDD with bulges but without disc herniation, at multiple levels; mild facet joint dz, primarily at L3-4 and L4-5 bilaterally  . Left knee pain     s/p ACL reconstruction by Dr. Sherlean Foot  . Carpal tunnel syndrome, right   . Alcohol abuse   . Chronic headache     Thought to be 2/2 cervical DJD vs anxiety. CT head in 2010 without any significant findings  . Allergic rhinitis   . Arthritis    Current Outpatient Prescriptions on File Prior to Visit  Medication Sig Dispense Refill  . HYDROcodone-acetaminophen (NORCO/VICODIN) 5-325 MG per tablet Take 1  tablet by mouth every 6 (six) hours as needed for pain.  15 tablet  0  . ibuprofen (ADVIL,MOTRIN) 400 MG tablet Take 1 tablet (400 mg total) by mouth every 6 (six) hours as needed for pain.  30 tablet  0  . methocarbamol (ROBAXIN) 500 MG tablet Take 1 tablet (500 mg total) by mouth 2 (two) times daily.  20 tablet  0  . ranitidine (ZANTAC) 300 MG tablet Take 1 tablet (300 mg total) by mouth at bedtime.  30 tablet  0  . traMADol (ULTRAM) 50 MG tablet Take 1 tablet (50 mg total) by mouth every 6 (six) hours as needed for pain.  60 tablet  1   No current facility-administered medications on file prior to visit.   No family history on file. History   Social History  . Marital Status: Married    Spouse Name: N/A    Number of Children: N/A  . Years of Education: N/A   Occupational History  . Not on file.   Social History Main Topics  . Smoking status: Never Smoker   . Smokeless tobacco: Not on file  . Alcohol Use: No  . Drug Use: No  . Sexual Activity: Not on file   Other Topics Concern  . Not on file   Social History Narrative  . No narrative on file  Review of Systems  Constitutional: Negative for fever, chills, diaphoresis, activity change, appetite change and fatigue.  HENT: Negative for ear pain, nosebleeds, congestion, facial swelling, rhinorrhea, neck pain, neck stiffness and ear discharge.   Eyes: Negative for pain, discharge, redness, itching and visual disturbance.  Respiratory: Negative for cough, choking, chest tightness, shortness of breath, wheezing and stridor.   Cardiovascular: Negative for chest pain, palpitations and leg swelling.  Gastrointestinal: Negative for abdominal distention.  Genitourinary: Negative for dysuria, urgency, frequency, hematuria, flank pain, decreased urine volume, difficulty urinating and dyspareunia.  Musculoskeletal: Chronic low back pain. Neurological: Negative for dizziness, tremors, seizures, syncope, facial asymmetry, speech  difficulty, weakness, light-headedness, numbness and headaches.  Hematological: Negative for adenopathy. Does not bruise/bleed easily.  Psychiatric/Behavioral: Negative for hallucinations, behavioral problems, confusion, dysphoric mood, decreased concentration and agitation.    Objective:   Filed Vitals:   11/17/12 1046  BP: 127/90  Pulse: 81  Temp: 98.3 F (36.8 C)    Physical Exam  Constitutional: Appears well-developed and well-nourished. No distress.  HENT: Normocephalic. External right and left ear normal. Oropharynx is clear and moist.  Eyes: Conjunctivae and EOM are normal. PERRLA, no scleral icterus.  Neck: Normal ROM. Neck supple. No JVD. No tracheal deviation. No thyromegaly.  CVS: RRR, S1/S2 +, no murmurs, no gallops, no carotid bruit.  Pulmonary: Effort and breath sounds normal, no stridor, rhonchi, wheezes, rales.  Abdominal: Soft.  Mild distention, BS +,  tenderness, rebound or guarding.  Musculoskeletal:  lower lumbar spine tenderness with palpation along the right sciatic area  Lymphadenopathy: No lymphadenopathy noted, cervical, inguinal. Neuro: Alert. Normal reflexes, muscle tone coordination. No cranial nerve deficit. Skin: Skin is warm and dry. No rash noted. Not diaphoretic. No erythema. No pallor.  Psychiatric: Normal mood and affect. Behavior, judgment, thought content normal.   Lab Results  Component Value Date   WBC 6.0 10/18/2012   HGB 13.2 10/18/2012   HCT 37.3 10/18/2012   MCV 88.4 10/18/2012   PLT 269 10/18/2012   Lab Results  Component Value Date   CREATININE 0.67 10/18/2012   BUN 6 10/18/2012   NA 142 10/18/2012   K 2.9* 10/18/2012   CL 105 10/18/2012   CO2 27 10/18/2012    Lab Results  Component Value Date   HGBA1C 5.0 08/12/2012   Lipid Panel     Component Value Date/Time   CHOL 163 03/17/2012 1107   TRIG 82 03/17/2012 1107   HDL 53 03/17/2012 1107   CHOLHDL 3.1 03/17/2012 1107   VLDL 16 03/17/2012 1107   LDLCALC 94 03/17/2012 1107      Assessment and plan:   Patient Active Problem List   Diagnosis Date Noted  . Hypokalemia 08/16/2012  . IT band syndrome 11/16/2011  . Right leg pain 08/31/2011  . Mononeuropathy of left upper extremity 07/15/2010  . Preventative health care 03/27/2010  . Pain in Joint, Shoulder Region 03/02/2007  . DERMATITIS, CONTACT, NOS 11/05/2006  . PAIN, CHRONIC NEC 04/27/2006  . HYPERLIPIDEMIA 03/02/2006  . FOOT PAIN, LEFT 02/07/2006  . ALCOHOL ABUSE 11/20/2005  . CARPAL TUNNEL SYNDROME, RIGHT 11/20/2005  . HYPERTENSION 11/20/2005  . ALLERGIC RHINITIS 11/20/2005  . DISORDER, MENSTRUAL NOS 11/20/2005  . KNEE PAIN, LEFT 11/20/2005  . BACK PAIN, LUMBAR 11/20/2005  . HEADACHE 11/20/2005   Pt has severe acid reflux and needs a GI referral to get an evaluation done once pt gets her orange [facility] card.   Rx for omeprazole 40 mg po daily.  [facility] - dental referral when pt gets orange card.    For chronic back pain, explained to patient that we would be able to refer her out to pain management for further evaluation.   Check some baseline labs today.   RTC in 1 months for recheck   The patient was given clear instructions to go to ER or return to medical center if symptoms don't improve, worsen or new problems develop.  The patient verbalized understanding.  The patient was told to call to get any lab results if not heard anything in the next week.    Rodney Langton, MD, CDE, FAAFP [facility] [place], [place]

## 2012-11-17 NOTE — Patient Instructions (Addendum)
Sciatica Sciatica is pain, weakness, numbness, or tingling along your sciatic nerve. The nerve starts in the lower back and runs down the back of each leg. Nerve damage or certain conditions pinch or put pressure on the sciatic nerve. This causes the pain, weakness, and other discomforts of sciatica. HOME CARE   Only take medicine as told by your doctor.  Apply ice to the affected area for 20 minutes. Do this 3 4 times a day for the first 48 72 hours. Then try heat in the same way.  Exercise, stretch, or do your usual activities if these do not make your pain worse.  Go to physical therapy as told by your doctor.  Keep all doctor visits as told.  Do not wear high heels or shoes that are not supportive.  Get a firm mattress if your mattress is too soft to lessen pain and discomfort. GET HELP RIGHT AWAY IF:   You cannot control when you poop (bowel movement) or pee (urinate).  You have more weakness in your lower back, lower belly (pelvis), butt (buttocks), or legs.  You have redness or puffiness (swelling) of your back.  You have a burning feeling when you pee.  You have pain that gets worse when you lie down.  You have pain that wakes you from your sleep.  Your pain is worse than past pain.  Your pain lasts longer than 4 weeks.  You are suddenly losing weight without reason. MAKE SURE YOU:   Understand these instructions.  Will watch this condition.  Will get help right away if you are not doing well or get worse. Document Released: 11/19/2007 Document Revised: 08/11/2011 Document Reviewed: 06/21/2011 [facility] Patient Information 2014 [place], [place]. Diet for Gastroesophageal Reflux Disease, Adult Reflux (acid reflux) is when acid from your stomach flows up into the esophagus. When acid comes in contact with the esophagus, the acid causes irritation and soreness (inflammation) in the esophagus. When reflux happens often or so severely that it causes damage to the  esophagus, it is called gastroesophageal reflux disease (GERD). Nutrition therapy can help ease the discomfort of GERD. FOODS OR DRINKS TO AVOID OR LIMIT  Smoking or chewing tobacco. Nicotine is one of the most potent stimulants to acid production in the gastrointestinal tract.  Caffeinated and decaffeinated coffee and black tea.  Regular or low-calorie carbonated beverages or energy drinks (caffeine-free carbonated beverages are allowed).   Strong spices, such as black pepper, white pepper, red pepper, cayenne, curry powder, and chili powder.  Peppermint or spearmint.  Chocolate.  High-fat foods, including meats and fried foods. Extra added fats including oils, butter, salad dressings, and nuts. Limit these to less than 8 tsp per day.  Fruits and vegetables if they are not tolerated, such as citrus fruits or tomatoes.  Alcohol.  Any food that seems to aggravate your condition. If you have questions regarding your diet, call your caregiver or a registered dietitian. OTHER THINGS THAT MAY HELP GERD INCLUDE:   Eating your meals slowly, in a relaxed setting.  Eating 5 to 6 small meals per day instead of 3 large meals.  Eliminating food for a period of time if it causes distress.  Not lying down until 3 hours after eating a meal.  Keeping the head of your bed raised 6 to 9 inches (15 to 23 cm) by using a foam wedge or blocks under the legs of the bed. Lying flat may make symptoms worse.  Being physically active. Weight loss may be  helpful in reducing reflux in overweight or obese adults.  Wear loose fitting clothing EXAMPLE MEAL PLAN This meal plan is approximately 2,000 calories based on https://www.bernard.org/ meal planning guidelines. Breakfast   cup cooked oatmeal.  1 cup strawberries.  1 cup low-fat milk.  1 oz almonds. Snack  1 cup cucumber slices.  6 oz yogurt (made from low-fat or fat-free milk). Lunch  2 slice whole-wheat bread.  2 oz sliced [place].  2  tsp mayonnaise.  1 cup blueberries.  1 cup snap peas. Snack  6 whole-wheat crackers.  1 oz string cheese. Dinner   cup brown rice.  1 cup mixed veggies.  1 tsp olive oil.  3 oz grilled fish. Document Released: 02/09/2005 Document Revised: 05/04/2011 Document Reviewed: 12/26/2010 [facility] Patient Information 2014 [place], [place]. Gastroesophageal Reflux Disease, Adult Gastroesophageal reflux disease (GERD) happens when acid from your stomach flows up into the esophagus. When acid comes in contact with the esophagus, the acid causes soreness (inflammation) in the esophagus. Over time, GERD may create small holes (ulcers) in the lining of the esophagus. CAUSES   Increased body weight. This puts pressure on the stomach, making acid rise from the stomach into the esophagus.  Smoking. This increases acid production in the stomach.  Drinking alcohol. This causes decreased pressure in the lower esophageal sphincter (valve or ring of muscle between the esophagus and stomach), allowing acid from the stomach into the esophagus.  Late evening meals and a full stomach. This increases pressure and acid production in the stomach.  A malformed lower esophageal sphincter. Sometimes, no cause is found. SYMPTOMS   Burning pain in the lower part of the mid-chest behind the breastbone and in the mid-stomach area. This may occur twice a week or more often.  Trouble swallowing.  Sore throat.  Dry cough.  Asthma-like symptoms including chest tightness, shortness of breath, or wheezing. DIAGNOSIS  Your caregiver may be able to diagnose GERD based on your symptoms. In some cases, X-rays and other tests may be done to check for complications or to check the condition of your stomach and esophagus. TREATMENT  Your caregiver may recommend over-the-counter or prescription medicines to help decrease acid production. Ask your caregiver before starting or adding any new medicines.  HOME CARE  INSTRUCTIONS   Change the factors that you can control. Ask your caregiver for guidance concerning weight loss, quitting smoking, and alcohol consumption.  Avoid foods and drinks that make your symptoms worse, such as:  Caffeine or alcoholic drinks.  Chocolate.  Peppermint or mint flavorings.  Garlic and onions.  Spicy foods.  Citrus fruits, such as oranges, lemons, or limes.  Tomato-based foods such as sauce, chili, salsa, and pizza.  Fried and fatty foods.  Avoid lying down for the 3 hours prior to your bedtime or prior to taking a nap.  Eat small, frequent meals instead of large meals.  Wear loose-fitting clothing. Do not wear anything tight around your waist that causes pressure on your stomach.  Raise the head of your bed 6 to 8 inches with wood blocks to help you sleep. Extra pillows will not help.  Only take over-the-counter or prescription medicines for pain, discomfort, or fever as directed by your caregiver.  Do not take aspirin, ibuprofen, or other nonsteroidal anti-inflammatory drugs (NSAIDs). SEEK IMMEDIATE MEDICAL CARE IF:   You have pain in your arms, neck, jaw, teeth, or back.  Your pain increases or changes in intensity or duration.  You develop nausea, vomiting, or sweating (diaphoresis).  You develop shortness of breath, or you faint.  Your vomit is green, yellow, black, or looks like coffee grounds or blood.  Your stool is red, bloody, or black. These symptoms could be signs of other problems, such as heart disease, gastric bleeding, or esophageal bleeding. MAKE SURE YOU:   Understand these instructions.  Will watch your condition.  Will get help right away if you are not doing well or get worse. Document Released: 11/19/2004 Document Revised: 05/04/2011 Document Reviewed: 08/29/2010 [facility] Patient Information 2014 [place], [place].

## 2012-11-22 ENCOUNTER — Telehealth: Payer: Self-pay | Admitting: Emergency Medicine

## 2012-11-22 NOTE — Telephone Encounter (Signed)
LEFT A MESSAGE WITH NEG LAB RESULTS

## 2012-11-22 NOTE — Progress Notes (Signed)
Quick Note:  Please inform patient that labs came back OK.   Rodney Langton, MD, CDE, FAAFP [facility] [place], [place]   ______

## 2012-11-22 NOTE — Telephone Encounter (Signed)
Message copied by Darlis Loan on Tue Nov 22, 2012  4:50 PM ------      Message from: Cleora Fleet      Created: Tue Nov 22, 2012  9:11 AM       Please inform patient that labs came back OK.             Rodney Langton, MD, CDE, FAAFP      [facility]      [facility]      [place], [place]        ------

## 2012-12-16 ENCOUNTER — Ambulatory Visit: Payer: Self-pay

## 2013-05-12 ENCOUNTER — Encounter: Payer: Self-pay | Admitting: Internal Medicine

## 2013-05-12 ENCOUNTER — Ambulatory Visit: Payer: Self-pay | Attending: Internal Medicine | Admitting: Internal Medicine

## 2013-05-12 VITALS — BP 165/95 | HR 79 | Temp 98.0°F | Resp 16 | Wt 142.0 lb

## 2013-05-12 DIAGNOSIS — M549 Dorsalgia, unspecified: Secondary | ICD-10-CM | POA: Insufficient documentation

## 2013-05-12 DIAGNOSIS — Z79899 Other long term (current) drug therapy: Secondary | ICD-10-CM | POA: Insufficient documentation

## 2013-05-12 DIAGNOSIS — Z760 Encounter for issue of repeat prescription: Secondary | ICD-10-CM | POA: Insufficient documentation

## 2013-05-12 DIAGNOSIS — M545 Low back pain, unspecified: Secondary | ICD-10-CM | POA: Insufficient documentation

## 2013-05-12 DIAGNOSIS — I10 Essential (primary) hypertension: Secondary | ICD-10-CM | POA: Insufficient documentation

## 2013-05-12 DIAGNOSIS — M62838 Other muscle spasm: Secondary | ICD-10-CM

## 2013-05-12 DIAGNOSIS — E785 Hyperlipidemia, unspecified: Secondary | ICD-10-CM | POA: Insufficient documentation

## 2013-05-12 DIAGNOSIS — K219 Gastro-esophageal reflux disease without esophagitis: Secondary | ICD-10-CM | POA: Insufficient documentation

## 2013-05-12 DIAGNOSIS — G8929 Other chronic pain: Secondary | ICD-10-CM | POA: Insufficient documentation

## 2013-05-12 MED ORDER — OMEPRAZOLE 40 MG PO CPDR: 40.0000 mg | DELAYED_RELEASE_CAPSULE | Freq: Every day | ORAL | Status: DC

## 2013-05-12 MED ORDER — METHOCARBAMOL 500 MG PO TABS: 500.0000 mg | ORAL_TABLET | Freq: Every evening | ORAL | Status: DC | PRN

## 2013-05-12 MED ORDER — CLONIDINE HCL 0.1 MG PO TABS
0.2000 mg | ORAL_TABLET | Freq: Once | ORAL | Status: AC
  Administered 2013-05-12: 0.2 mg via ORAL

## 2013-05-12 MED ORDER — RANITIDINE HCL 300 MG PO TABS: 300.0000 mg | ORAL_TABLET | Freq: Every day | ORAL | Status: DC

## 2013-05-12 MED ORDER — LISINOPRIL-HYDROCHLOROTHIAZIDE 10-12.5 MG PO TABS: 1.0000 | ORAL_TABLET | Freq: Every day | ORAL | Status: DC

## 2013-05-12 MED ORDER — TRAMADOL HCL 50 MG PO TABS: 50.0000 mg | ORAL_TABLET | Freq: Three times a day (TID) | ORAL | Status: DC | PRN

## 2013-05-12 NOTE — Progress Notes (Signed)
MRN: [id number] Name: Toni Gutierrez  Sex: female Age: 53 y.o. DOB: 01-28-1961  Allergies: Review of patient's allergies indicates no known allergies.  Chief Complaint  Patient presents with  . Back Pain    HPI: Patient is 53 y.o. female who has history of hypertension, GERD, hyperlipidemia, chronic lower back pain comes today requesting refill on the medication, she has not been taking any medications today her blood pressure is elevated denies any headache dizziness chest and shortness of breath, as per patient she used to be on hydrochlorothiazide in the past, she was given clonidine 0.2 mg, repeat blood pressure is 165/95 manually. Patient had a blood work done in September which was reviewed with the patient.  Past Medical History  Diagnosis Date  . Hypertension   . Hyperlipidemia   . Shoulder pain, bilateral     MRI right shoulder showing tear of infraspinatus tendon with bursal surface fraying of the infraspinatus  . Chronic lower back pain     MRI of cervical, lumbar spine (all obtained by Dr. Ronnie Derby in 2006)- showing mild DDD with bulges but without disc herniation, at multiple levels; mild facet joint dz, primarily at L3-4 and L4-5 bilaterally  . Left knee pain     s/p ACL reconstruction by Dr. Ronnie Derby  . Carpal tunnel syndrome, right   . Alcohol abuse   . Chronic headache     Thought to be 2/2 cervical DJD vs anxiety. CT head in 2010 without any significant findings  . Allergic rhinitis   . Arthritis     Past Surgical History  Procedure Laterality Date  . Tubal ligation  1989  . Anterior cruciate ligament repair      By Dr. Ronnie Derby  . Rotator cuff repair      Right shoulder      Medication List       This list is accurate as of: 05/12/13 10:21 AM.  Always use your most recent med list.               lisinopril-hydrochlorothiazide 10-12.5 MG per tablet  Commonly known as:  PRINZIDE,ZESTORETIC  Take 1 tablet by mouth daily.     methocarbamol 500 MG  tablet  Commonly known as:  ROBAXIN  Take 1 tablet (500 mg total) by mouth at bedtime as needed for muscle spasms.     omeprazole 40 MG capsule  Commonly known as:  PRILOSEC  Take 1 capsule (40 mg total) by mouth daily.     ranitidine 300 MG tablet  Commonly known as:  ZANTAC  Take 1 tablet (300 mg total) by mouth at bedtime.     traMADol 50 MG tablet  Commonly known as:  ULTRAM  Take 1 tablet (50 mg total) by mouth every 8 (eight) hours as needed.        Meds ordered this encounter  Medications  . cloNIDine (CATAPRES) tablet 0.2 mg    Sig:   . omeprazole (PRILOSEC) 40 MG capsule    Sig: Take 1 capsule (40 mg total) by mouth daily.    Dispense:  30 capsule    Refill:  3  . ranitidine (ZANTAC) 300 MG tablet    Sig: Take 1 tablet (300 mg total) by mouth at bedtime.    Dispense:  30 tablet    Refill:  0  . traMADol (ULTRAM) 50 MG tablet    Sig: Take 1 tablet (50 mg total) by mouth every 8 (eight) hours as needed.  Dispense:  40 tablet    Refill:  0  . lisinopril-hydrochlorothiazide (PRINZIDE,ZESTORETIC) 10-12.5 MG per tablet    Sig: Take 1 tablet by mouth daily.    Dispense:  90 tablet    Refill:  3  . methocarbamol (ROBAXIN) 500 MG tablet    Sig: Take 1 tablet (500 mg total) by mouth at bedtime as needed for muscle spasms.    Dispense:  30 tablet    Refill:  2    Immunization History  Administered Date(s) Administered  . Influenza Split 03/17/2012, 11/17/2012  . Influenza Whole 12/23/2005, 12/23/2006, 11/21/2008  . Tdap 03/27/2010    History reviewed. No pertinent family history.  History  Substance Use Topics  . Smoking status: Never Smoker   . Smokeless tobacco: Not on file  . Alcohol Use: No    Review of Systems   As noted in HPI  Filed Vitals:   05/12/13 0927  BP: 165/95  Pulse:   Temp:   Resp:     Physical Exam  Physical Exam  Constitutional: No distress.  HENT:  Head: Normocephalic and atraumatic.  Eyes: EOM are normal. Pupils are  equal, round, and reactive to light.  Cardiovascular: Normal rate and regular rhythm.   Pulmonary/Chest: Breath sounds normal. No respiratory distress. She has no wheezes. She has no rales.  Musculoskeletal: She exhibits no tenderness.  Lower lumbar spinal and paraspinal tenderness  Strength equal in both lower extremities DTR 2+    CBC    Component Value Date/Time   WBC 6.0 11/17/2012 1114   RBC 4.46 11/17/2012 1114   RBC 3.71* 01/23/2009 1132   HGB 13.4 11/17/2012 1114   HCT 39.2 11/17/2012 1114   PLT 323 11/17/2012 1114   MCV 87.9 11/17/2012 1114   LYMPHSABS 2.2 08/12/2012 1223   MONOABS 0.4 08/12/2012 1223   EOSABS 0.1 08/12/2012 1223   BASOSABS 0.1 08/12/2012 1223    CMP     Component Value Date/Time   NA 143 11/17/2012 1114   K 4.0 11/17/2012 1114   CL 104 11/17/2012 1114   CO2 31 11/17/2012 1114   GLUCOSE 76 11/17/2012 1114   BUN 6 11/17/2012 1114   CREATININE 0.75 11/17/2012 1114   CREATININE 0.67 10/18/2012 1353   CALCIUM 9.7 11/17/2012 1114   PROT 7.5 11/17/2012 1114   ALBUMIN 4.2 11/17/2012 1114   AST 20 11/17/2012 1114   ALT 17 11/17/2012 1114   ALKPHOS 77 11/17/2012 1114   BILITOT 0.3 11/17/2012 1114   GFRNONAA >90 10/18/2012 1353   GFRAA >90 10/18/2012 1353    Lab Results  Component Value Date/Time   CHOL 201* 11/17/2012 11:14 AM    No components found with this basename: hga1c    Lab Results  Component Value Date/Time   AST 20 11/17/2012 11:14 AM    Assessment and Plan  High blood pressure - Plan: cloNIDine (CATAPRES) tablet 0.2 mg  Back pain - Plan: traMADol (ULTRAM) 50 MG tablet when necessary for pain  GERD (gastroesophageal reflux disease) -advised patient for lifestyle modification Plan: Continue with omeprazole (PRILOSEC) 40 MG capsule, ranitidine (ZANTAC) 300 MG tablet  HLD (hyperlipidemia) Advised patient for low fat diet  HTN (hypertension) - Plan: Advised patient for DASH diet, started on lisinopril-hydrochlorothiazide (PRINZIDE,ZESTORETIC) 10-12.5 MG  per tablet, she'll come back in 2 weeks for BP check, will repeat blood chemistry in 3 months.   Muscle spasm - Plan: methocarbamol (ROBAXIN) 500 MG tablet each bedtime, advise patient for heating pad  Return in  about 3 months (around 08/12/2013) for hypertension, BP check in 2 weeks .  Lorayne Marek, MD

## 2013-05-12 NOTE — Patient Instructions (Signed)
Fat and Cholesterol Control Diet Fat and cholesterol levels in your blood and organs are influenced by your diet. High levels of fat and cholesterol may lead to diseases of the heart, small and large blood vessels, gallbladder, liver, and pancreas. CONTROLLING FAT AND CHOLESTEROL WITH DIET Although exercise and lifestyle factors are important, your diet is key. That is because certain foods are known to raise cholesterol and others to lower it. The goal is to balance foods for their effect on cholesterol and more importantly, to replace saturated and trans fat with other types of fat, such as monounsaturated fat, polyunsaturated fat, and omega-3 fatty acids. On average, a person should consume no more than 15 to 17 g of saturated fat daily. Saturated and trans fats are considered "bad" fats, and they will raise LDL cholesterol. Saturated fats are primarily found in animal products such as meats, butter, and cream. However, that does not mean you need to give up all your favorite foods. Today, there are good tasting, low-fat, low-cholesterol substitutes for most of the things you like to eat. Choose low-fat or nonfat alternatives. Choose round or loin cuts of red meat. These types of cuts are lowest in fat and cholesterol. Chicken (without the skin), fish, veal, and ground turkey breast are great choices. Eliminate fatty meats, such as hot dogs and salami. Even shellfish have little or no saturated fat. Have a 3 oz (85 g) portion when you eat lean meat, poultry, or fish. Trans fats are also called "partially hydrogenated oils." They are oils that have been scientifically manipulated so that they are solid at room temperature resulting in a longer shelf life and improved taste and texture of foods in which they are added. Trans fats are found in stick margarine, some tub margarines, cookies, crackers, and baked goods.  When baking and cooking, oils are a great substitute for butter. The monounsaturated oils are  especially beneficial since it is believed they lower LDL and raise HDL. The oils you should avoid entirely are saturated tropical oils, such as coconut and palm.  Remember to eat a lot from food groups that are naturally free of saturated and trans fat, including fish, fruit, vegetables, beans, grains (barley, rice, couscous, bulgur wheat), and pasta (without cream sauces).  IDENTIFYING FOODS THAT LOWER FAT AND CHOLESTEROL  Soluble fiber may lower your cholesterol. This type of fiber is found in fruits such as apples, vegetables such as broccoli, potatoes, and carrots, legumes such as beans, peas, and lentils, and grains such as barley. Foods fortified with plant sterols (phytosterol) may also lower cholesterol. You should eat at least 2 g per day of these foods for a cholesterol lowering effect.  Read package labels to identify low-saturated fats, trans fat free, and low-fat foods at the supermarket. Select cheeses that have only 2 to 3 g saturated fat per ounce. Use a heart-healthy tub margarine that is free of trans fats or partially hydrogenated oil. When buying baked goods (cookies, crackers), avoid partially hydrogenated oils. Breads and muffins should be made from whole grains (whole-wheat or whole oat flour, instead of "flour" or "enriched flour"). Buy non-creamy canned soups with reduced salt and no added fats.  FOOD PREPARATION TECHNIQUES  Never deep-fry. If you must fry, either stir-fry, which uses very little fat, or use non-stick cooking sprays. When possible, broil, bake, or roast meats, and steam vegetables. Instead of putting butter or margarine on vegetables, use lemon and herbs, applesauce, and cinnamon (for squash and sweet potatoes). Use nonfat   yogurt, salsa, and low-fat dressings for salads.  LOW-SATURATED FAT / LOW-FAT FOOD SUBSTITUTES Meats / Saturated Fat (g)  Avoid: Steak, marbled (3 oz/85 g) / 11 g  Choose: Steak, lean (3 oz/85 g) / 4 g  Avoid: Hamburger (3 oz/85 g) / 7  g  Choose: Hamburger, lean (3 oz/85 g) / 5 g  Avoid: Ham (3 oz/85 g) / 6 g  Choose: Ham, lean cut (3 oz/85 g) / 2.4 g  Avoid: Chicken, with skin, dark meat (3 oz/85 g) / 4 g  Choose: Chicken, skin removed, dark meat (3 oz/85 g) / 2 g  Avoid: Chicken, with skin, light meat (3 oz/85 g) / 2.5 g  Choose: Chicken, skin removed, light meat (3 oz/85 g) / 1 g Dairy / Saturated Fat (g)  Avoid: Whole milk (1 cup) / 5 g  Choose: Low-fat milk, 2% (1 cup) / 3 g  Choose: Low-fat milk, 1% (1 cup) / 1.5 g  Choose: Skim milk (1 cup) / 0.3 g  Avoid: Hard cheese (1 oz/28 g) / 6 g  Choose: Skim milk cheese (1 oz/28 g) / 2 to 3 g  Avoid: Cottage cheese, 4% fat (1 cup) / 6.5 g  Choose: Low-fat cottage cheese, 1% fat (1 cup) / 1.5 g  Avoid: Ice cream (1 cup) / 9 g  Choose: Sherbet (1 cup) / 2.5 g  Choose: Nonfat frozen yogurt (1 cup) / 0.3 g  Choose: Frozen fruit bar / trace  Avoid: Whipped cream (1 tbs) / 3.5 g  Choose: Nondairy whipped topping (1 tbs) / 1 g Condiments / Saturated Fat (g)  Avoid: Mayonnaise (1 tbs) / 2 g  Choose: Low-fat mayonnaise (1 tbs) / 1 g  Avoid: Butter (1 tbs) / 7 g  Choose: Extra light margarine (1 tbs) / 1 g  Avoid: Coconut oil (1 tbs) / 11.8 g  Choose: Olive oil (1 tbs) / 1.8 g  Choose: Corn oil (1 tbs) / 1.7 g  Choose: Safflower oil (1 tbs) / 1.2 g  Choose: Sunflower oil (1 tbs) / 1.4 g  Choose: Soybean oil (1 tbs) / 2.4 g  Choose: Canola oil (1 tbs) / 1 g Document Released: 02/09/2005 Document Revised: 06/06/2012 Document Reviewed: 07/31/2010 Exit[employer] Patient Information 2014 [employer] DASH Diet The DASH diet stands for "Dietary Approaches to Stop Hypertension." It is a healthy eating plan that has been shown to reduce high blood pressure (hypertension) in as little as 14 days, while also possibly providing other significant health benefits. These other health benefits include reducing the risk of breast cancer after menopause and  reducing the risk of type 2 diabetes, heart disease, colon cancer, and stroke. Health benefits also include weight loss and slowing kidney failure in patients with chronic kidney disease.  DIET GUIDELINES  Limit salt (sodium). Your diet should contain less than 1500 mg of sodium daily.  Limit refined or processed carbohydrates. Your diet should include mostly whole grains. Desserts and added sugars should be used sparingly.  Include small amounts of heart-healthy fats. These types of fats include nuts, oils, and tub margarine. Limit saturated and trans fats. These fats have been shown to be harmful in the body. CHOOSING FOODS  The following food groups are based on a 2000 calorie diet. See your Registered Dietitian for individual calorie needs. Grains and Grain Products (6 to 8 servings daily)  Eat More Often: Whole-wheat bread, brown rice, whole-grain or wheat pasta, quinoa, popcorn without added fat or salt (air   popped).  Eat Less Often: White bread, white pasta, white rice, cornbread. Vegetables (4 to 5 servings daily)  Eat More Often: Fresh, frozen, and canned vegetables. Vegetables may be raw, steamed, roasted, or grilled with a minimal amount of fat.  Eat Less Often/Avoid: Creamed or fried vegetables. Vegetables in a cheese sauce. Fruit (4 to 5 servings daily)  Eat More Often: All fresh, canned (in natural juice), or frozen fruits. Dried fruits without added sugar. One hundred percent fruit juice ( cup [237 mL] daily).  Eat Less Often: Dried fruits with added sugar. Canned fruit in light or heavy syrup. Lean Meats, Fish, and Poultry (2 servings or less daily. One serving is 3 to 4 oz [85-114 g]).  Eat More Often: Ninety percent or leaner ground beef, tenderloin, sirloin. Round cuts of beef, chicken breast, turkey breast. All fish. Grill, bake, or broil your meat. Nothing should be fried.  Eat Less Often/Avoid: Fatty cuts of meat, turkey, or chicken leg, thigh, or wing. Fried cuts  of meat or fish. Dairy (2 to 3 servings)  Eat More Often: Low-fat or fat-free milk, low-fat plain or light yogurt, reduced-fat or part-skim cheese.  Eat Less Often/Avoid: Milk (whole, 2%).Whole milk yogurt. Full-fat cheeses. Nuts, Seeds, and Legumes (4 to 5 servings per week)  Eat More Often: All without added salt.  Eat Less Often/Avoid: Salted nuts and seeds, canned beans with added salt. Fats and Sweets (limited)  Eat More Often: Vegetable oils, tub margarines without trans fats, sugar-free gelatin. Mayonnaise and salad dressings.  Eat Less Often/Avoid: Coconut oils, palm oils, butter, stick margarine, cream, half and half, cookies, candy, pie. FOR MORE INFORMATION The Dash Diet Eating Plan: www.dashdiet.org Document Released: 01/29/2011 Document Revised: 05/04/2011 Document Reviewed: 01/29/2011 Exit[employer] Patient Information 2014 [employer].  

## 2013-05-12 NOTE — Progress Notes (Signed)
Patient complains of back pain Having some cramping in both of her legs Blood pressure is elevated \ States has not taken her medication because she ran out

## 2013-05-26 ENCOUNTER — Ambulatory Visit: Payer: Self-pay

## 2013-06-12 ENCOUNTER — Telehealth: Payer: Self-pay

## 2013-06-12 DIAGNOSIS — M549 Dorsalgia, unspecified: Secondary | ICD-10-CM

## 2013-06-12 MED ORDER — TRAMADOL HCL 50 MG PO TABS: 50.0000 mg | ORAL_TABLET | Freq: Three times a day (TID) | ORAL | Status: DC | PRN

## 2013-06-12 NOTE — Telephone Encounter (Signed)
Patient called  Wanting a refill on her tramadol Spoke with Dr. Annitta Needs- he said we can refill the prescription She needs to come back in for blood pressure check as well

## 2013-06-20 ENCOUNTER — Other Ambulatory Visit: Payer: Self-pay | Admitting: Internal Medicine

## 2013-06-22 ENCOUNTER — Other Ambulatory Visit: Payer: Self-pay | Admitting: Emergency Medicine

## 2013-06-22 ENCOUNTER — Other Ambulatory Visit: Payer: Self-pay | Admitting: Internal Medicine

## 2013-06-23 ENCOUNTER — Ambulatory Visit: Payer: Self-pay

## 2013-06-23 ENCOUNTER — Other Ambulatory Visit: Payer: Self-pay

## 2013-06-27 ENCOUNTER — Other Ambulatory Visit: Payer: Self-pay

## 2013-06-30 ENCOUNTER — Other Ambulatory Visit: Payer: Self-pay

## 2013-07-14 ENCOUNTER — Ambulatory Visit: Payer: Self-pay

## 2013-07-14 ENCOUNTER — Ambulatory Visit: Payer: Self-pay | Attending: Internal Medicine

## 2013-07-27 ENCOUNTER — Other Ambulatory Visit: Payer: Self-pay | Admitting: Internal Medicine

## 2013-08-07 ENCOUNTER — Ambulatory Visit: Payer: Self-pay | Admitting: Internal Medicine

## 2013-09-06 ENCOUNTER — Ambulatory Visit: Payer: Self-pay | Admitting: Internal Medicine

## 2013-09-18 ENCOUNTER — Other Ambulatory Visit: Payer: Self-pay | Admitting: Internal Medicine

## 2013-09-18 DIAGNOSIS — K219 Gastro-esophageal reflux disease without esophagitis: Secondary | ICD-10-CM

## 2013-10-12 ENCOUNTER — Other Ambulatory Visit: Payer: Self-pay | Admitting: Internal Medicine

## 2013-10-12 ENCOUNTER — Other Ambulatory Visit: Payer: Self-pay | Admitting: Family Medicine

## 2013-10-12 DIAGNOSIS — K219 Gastro-esophageal reflux disease without esophagitis: Secondary | ICD-10-CM

## 2013-10-18 ENCOUNTER — Ambulatory Visit: Payer: Self-pay | Attending: Internal Medicine | Admitting: Internal Medicine

## 2013-10-18 ENCOUNTER — Encounter: Payer: Self-pay | Admitting: Internal Medicine

## 2013-10-18 VITALS — BP 129/85 | HR 88 | Temp 98.1°F | Resp 16 | Ht 59.0 in | Wt 141.0 lb

## 2013-10-18 DIAGNOSIS — G8929 Other chronic pain: Secondary | ICD-10-CM | POA: Insufficient documentation

## 2013-10-18 DIAGNOSIS — M25519 Pain in unspecified shoulder: Secondary | ICD-10-CM | POA: Insufficient documentation

## 2013-10-18 DIAGNOSIS — M5441 Lumbago with sciatica, right side: Secondary | ICD-10-CM

## 2013-10-18 DIAGNOSIS — M5430 Sciatica, unspecified side: Secondary | ICD-10-CM | POA: Insufficient documentation

## 2013-10-18 DIAGNOSIS — M129 Arthropathy, unspecified: Secondary | ICD-10-CM | POA: Insufficient documentation

## 2013-10-18 DIAGNOSIS — J309 Allergic rhinitis, unspecified: Secondary | ICD-10-CM | POA: Insufficient documentation

## 2013-10-18 DIAGNOSIS — I10 Essential (primary) hypertension: Secondary | ICD-10-CM | POA: Insufficient documentation

## 2013-10-18 DIAGNOSIS — M545 Low back pain, unspecified: Secondary | ICD-10-CM | POA: Insufficient documentation

## 2013-10-18 DIAGNOSIS — M25569 Pain in unspecified knee: Secondary | ICD-10-CM | POA: Insufficient documentation

## 2013-10-18 DIAGNOSIS — E785 Hyperlipidemia, unspecified: Secondary | ICD-10-CM | POA: Insufficient documentation

## 2013-10-18 DIAGNOSIS — G5600 Carpal tunnel syndrome, unspecified upper limb: Secondary | ICD-10-CM | POA: Insufficient documentation

## 2013-10-18 DIAGNOSIS — Z9889 Other specified postprocedural states: Secondary | ICD-10-CM | POA: Insufficient documentation

## 2013-10-18 DIAGNOSIS — R51 Headache: Secondary | ICD-10-CM | POA: Insufficient documentation

## 2013-10-18 DIAGNOSIS — R252 Cramp and spasm: Secondary | ICD-10-CM

## 2013-10-18 DIAGNOSIS — M62838 Other muscle spasm: Secondary | ICD-10-CM

## 2013-10-18 MED ORDER — METHOCARBAMOL 500 MG PO TABS: 500.0000 mg | ORAL_TABLET | Freq: Every evening | ORAL | Status: DC | PRN

## 2013-10-18 MED ORDER — TRAMADOL HCL 50 MG PO TABS: ORAL_TABLET | ORAL | Status: DC

## 2013-10-18 NOTE — Progress Notes (Signed)
Patient ID: Toni Gutierrez, female   DOB: Jan 16, 1961, 53 y.o.   MRN: [id number]  CC: leg cramps, back pain  HPI:  Patient reports that she gets left sided back pain that radiates to her right side and then shoots down to her right leg.  She states that she has frequent spasms in her legs at night.  She c/o of sharp pains all night.  She c/o of a burning sensation on the lateral part of her right thigh.  She has no history of DM.  She has been seen by orthopedics and was told that she has scoliosis, but was not given further instructions.   No Known Allergies Past Medical History  Diagnosis Date  . Hypertension   . Hyperlipidemia   . Shoulder pain, bilateral     MRI right shoulder showing tear of infraspinatus tendon with bursal surface fraying of the infraspinatus  . Chronic lower back pain     MRI of cervical, lumbar spine (all obtained by Dr. Ronnie Gutierrez in 2006)- showing mild DDD with bulges but without disc herniation, at multiple levels; mild facet joint dz, primarily at L3-4 and L4-5 bilaterally  . Left knee pain     s/p ACL reconstruction by Dr. Ronnie Gutierrez  . Carpal tunnel syndrome, right   . Alcohol abuse   . Chronic headache     Thought to be 2/2 cervical DJD vs anxiety. CT head in 2010 without any significant findings  . Allergic rhinitis   . Arthritis    Current Outpatient Prescriptions on File Prior to Visit  Medication Sig Dispense Refill  . lisinopril-hydrochlorothiazide (PRINZIDE,ZESTORETIC) 10-12.5 MG per tablet Take 1 tablet by mouth daily.  90 tablet  3  . methocarbamol (ROBAXIN) 500 MG tablet Take 1 tablet (500 mg total) by mouth at bedtime as needed for muscle spasms.  30 tablet  2  . omeprazole (PRILOSEC) 40 MG capsule TAKE 1 CAPSULE BY MOUTH ONCE DAILY  30 capsule  0  . ranitidine (ZANTAC) 150 MG tablet TAKE 2 TABLETS BY MOUTH NIGHTLY AT BEDTIME  60 tablet  0  . traMADol (ULTRAM) 50 MG tablet TAKE ONE TABLET BY MOUTH EVERY 8 HOURS AS NEEDED  40 tablet  0   No current  facility-administered medications on file prior to visit.   History reviewed. No pertinent family history. History   Social History  . Marital Status: Married    Spouse Name: N/A    Number of Children: N/A  . Years of Education: N/A   Occupational History  . Not on file.   Social History Main Topics  . Smoking status: Never Smoker   . Smokeless tobacco: Not on file  . Alcohol Use: No  . Drug Use: No  . Sexual Activity: Not on file   Other Topics Concern  . Not on file   Social History Narrative  . No narrative on file    Review of Systems  HENT: Negative.   Eyes: Negative for blurred vision and double vision.  Respiratory: Negative.   Cardiovascular: Negative.   Musculoskeletal: Positive for back pain. Negative for falls, joint pain and neck pain.  Neurological: Negative for dizziness and tingling.       Objective:   Filed Vitals:   10/18/13 1203  BP: 129/85  Pulse: 88  Temp: 98.1 F (36.7 C)  Resp: 16   Physical Exam  Neck: Normal range of motion. Neck supple.  Cardiovascular: Normal rate, regular rhythm and normal heart sounds.   Pulmonary/Chest: Effort  normal and breath sounds normal.  Abdominal: Soft. Bowel sounds are normal.  Musculoskeletal: She exhibits tenderness (right paraspinal tenderness). She exhibits no edema.  Skin: Skin is warm and dry.     Lab Results  Component Value Date   WBC 6.0 11/17/2012   HGB 13.4 11/17/2012   HCT 39.2 11/17/2012   MCV 87.9 11/17/2012   PLT 323 11/17/2012   Lab Results  Component Value Date   CREATININE 0.75 11/17/2012   BUN 6 11/17/2012   NA 143 11/17/2012   K 4.0 11/17/2012   CL 104 11/17/2012   CO2 31 11/17/2012    Lab Results  Component Value Date   HGBA1C 5.0 08/12/2012   Lipid Panel     Component Value Date/Time   CHOL 201* 11/17/2012 1114   TRIG 94 11/17/2012 1114   HDL 54 11/17/2012 1114   CHOLHDL 3.7 11/17/2012 1114   VLDL 19 11/17/2012 1114   LDLCALC 128* 11/17/2012 1114       Assessment and  plan:   Toni Gutierrez was seen today for follow-up, back pain and medication refill.  Diagnoses and associated orders for this visit:  Cramps of right lower extremity - COMPLETE METABOLIC PANEL WITH GFR will see if related to potassium  Bilateral low back pain with right-sided sciatica - traMADol (ULTRAM) 50 MG tablet; TAKE ONE TABLET BY MOUTH EVERY 8 HOURS AS NEEDED  Muscle spasm - methocarbamol (ROBAXIN) 500 MG tablet; Take 1 tablet (500 mg total) by mouth at bedtime as needed for muscle spasms.   Return if symptoms worsen or fail to improve.        Toni Gutierrez, [facility] [phone] 10/22/2013, 12:15 PM

## 2013-10-18 NOTE — Progress Notes (Signed)
Pt is here to follow up with chronic lower back pain radiating to right leg States the pain is constant burning sensation  Pt is in the process of getting [facility] discount [facility] Taking Tramadol to control pain. States its not working

## 2013-10-19 LAB — COMPLETE METABOLIC PANEL WITH GFR
ALBUMIN: 4.1 g/dL (ref 3.5–5.2)
ALK PHOS: 75 U/L (ref 39–117)
ALT: 19 U/L (ref 0–35)
AST: 22 U/L (ref 0–37)
BUN: 8 mg/dL (ref 6–23)
CALCIUM: 9.1 mg/dL (ref 8.4–10.5)
CHLORIDE: 103 meq/L (ref 96–112)
CO2: 30 mEq/L (ref 19–32)
Creat: 0.74 mg/dL (ref 0.50–1.10)
GFR, Est African American: >89 mL/min
GFR, Est Non African American: >89 mL/min
Glucose, Bld: 80 mg/dL (ref 70–99)
POTASSIUM: 3.7 meq/L (ref 3.5–5.3)
SODIUM: 141 meq/L (ref 135–145)
TOTAL PROTEIN: 7.5 g/dL (ref 6.0–8.3)
Total Bilirubin: 0.2 mg/dL (ref 0.2–1.2)

## 2013-10-20 ENCOUNTER — Telehealth: Payer: Self-pay | Admitting: Emergency Medicine

## 2013-10-20 NOTE — Telephone Encounter (Signed)
Message copied by Ricci Barker on Fri Oct 20, 2013 11:49 AM ------      Message from: Chari Manning A      Created: Thu Oct 19, 2013 11:15 PM       Electrolytes are normal, potassium is not the cause of leg cramps. ------

## 2013-10-20 NOTE — Telephone Encounter (Signed)
Pt given lab results. States she is still experiencing leg cramps in lower back radiating down legs. States pain medication prescribed not working. Pt concerned of next step

## 2013-10-20 NOTE — Telephone Encounter (Signed)
Left message for pt to call when message received 

## 2013-10-20 NOTE — Telephone Encounter (Deleted)
Left message for pt to

## 2013-10-22 NOTE — Telephone Encounter (Signed)
Sports Medicine

## 2013-11-17 ENCOUNTER — Other Ambulatory Visit: Payer: Self-pay | Admitting: Internal Medicine

## 2013-12-08 ENCOUNTER — Ambulatory Visit: Payer: Self-pay | Admitting: Internal Medicine

## 2013-12-08 ENCOUNTER — Other Ambulatory Visit: Payer: Self-pay | Admitting: Internal Medicine

## 2013-12-11 ENCOUNTER — Other Ambulatory Visit: Payer: Self-pay

## 2013-12-11 DIAGNOSIS — K219 Gastro-esophageal reflux disease without esophagitis: Secondary | ICD-10-CM

## 2013-12-11 MED ORDER — RANITIDINE HCL 150 MG PO TABS: ORAL_TABLET | ORAL | Status: DC

## 2014-01-15 ENCOUNTER — Other Ambulatory Visit: Payer: Self-pay | Admitting: Internal Medicine

## 2014-01-26 ENCOUNTER — Telehealth: Payer: Self-pay | Admitting: Internal Medicine

## 2014-01-26 NOTE — Telephone Encounter (Signed)
Pt calling to request medication refills, has appt on 02/02/14 for f/u. Please f/u with pt.

## 2014-02-02 ENCOUNTER — Other Ambulatory Visit: Payer: Self-pay

## 2014-02-02 ENCOUNTER — Telehealth: Payer: Self-pay

## 2014-02-02 ENCOUNTER — Ambulatory Visit: Payer: Self-pay | Admitting: Internal Medicine

## 2014-02-02 DIAGNOSIS — K219 Gastro-esophageal reflux disease without esophagitis: Secondary | ICD-10-CM

## 2014-02-02 MED ORDER — RANITIDINE HCL 150 MG PO TABS: ORAL_TABLET | ORAL | Status: DC

## 2014-02-02 MED ORDER — OMEPRAZOLE 40 MG PO CPDR: 40.0000 mg | DELAYED_RELEASE_CAPSULE | Freq: Every day | ORAL | Status: DC

## 2014-02-02 NOTE — Telephone Encounter (Signed)
Patient called requesting a refill on her omeprazole and rantidine [facility]

## 2014-03-06 ENCOUNTER — Other Ambulatory Visit: Payer: Self-pay | Admitting: Internal Medicine

## 2014-04-16 ENCOUNTER — Other Ambulatory Visit: Payer: Self-pay | Admitting: Internal Medicine

## 2014-04-25 ENCOUNTER — Telehealth: Payer: Self-pay

## 2014-04-25 MED ORDER — OMEPRAZOLE 40 MG PO CPDR: 40.0000 mg | DELAYED_RELEASE_CAPSULE | Freq: Every day | ORAL | Status: DC

## 2014-04-25 NOTE — Telephone Encounter (Signed)
Patient called wanting refills on her medications Refill given but only one month supply Patient needs to schedule an appointment

## 2014-05-02 ENCOUNTER — Ambulatory Visit: Payer: Self-pay | Admitting: Internal Medicine

## 2014-05-04 ENCOUNTER — Ambulatory Visit: Payer: Self-pay | Attending: Internal Medicine | Admitting: Internal Medicine

## 2014-05-04 ENCOUNTER — Encounter: Payer: Self-pay | Admitting: Internal Medicine

## 2014-05-04 VITALS — BP 150/98 | HR 91 | Temp 98.0°F | Resp 16 | Ht 59.0 in | Wt 145.0 lb

## 2014-05-04 DIAGNOSIS — I10 Essential (primary) hypertension: Secondary | ICD-10-CM | POA: Insufficient documentation

## 2014-05-04 DIAGNOSIS — K219 Gastro-esophageal reflux disease without esophagitis: Secondary | ICD-10-CM | POA: Insufficient documentation

## 2014-05-04 DIAGNOSIS — M79605 Pain in left leg: Secondary | ICD-10-CM | POA: Insufficient documentation

## 2014-05-04 DIAGNOSIS — M25552 Pain in left hip: Secondary | ICD-10-CM | POA: Insufficient documentation

## 2014-05-04 DIAGNOSIS — Z1239 Encounter for other screening for malignant neoplasm of breast: Secondary | ICD-10-CM

## 2014-05-04 DIAGNOSIS — M1990 Unspecified osteoarthritis, unspecified site: Secondary | ICD-10-CM | POA: Insufficient documentation

## 2014-05-04 MED ORDER — CYCLOBENZAPRINE HCL 10 MG PO TABS: 10.0000 mg | ORAL_TABLET | Freq: Three times a day (TID) | ORAL | Status: DC | PRN

## 2014-05-04 MED ORDER — LISINOPRIL-HYDROCHLOROTHIAZIDE 10-12.5 MG PO TABS: 1.0000 | ORAL_TABLET | Freq: Every day | ORAL | Status: DC

## 2014-05-04 MED ORDER — OMEPRAZOLE 40 MG PO CPDR: 40.0000 mg | DELAYED_RELEASE_CAPSULE | Freq: Every day | ORAL | Status: DC

## 2014-05-04 MED ORDER — ACETAMINOPHEN-CODEINE #3 300-30 MG PO TABS: 1.0000 | ORAL_TABLET | Freq: Three times a day (TID) | ORAL | Status: DC | PRN

## 2014-05-04 NOTE — Addendum Note (Signed)
Addended by: Chari Manning A on: 05/04/2014 11:32 AM   Modules accepted: Orders

## 2014-05-04 NOTE — Progress Notes (Addendum)
   Subjective:    Patient ID: Toni Gutierrez, female    DOB: 1960-08-26, 54 y.o.   MRN: [id number]  HPI Comments: Patient complains of left hip and left leg pain that burns down her her anterior thigh  Right shoulder/neck radiates down her arm and into her sternum.  It is a sharp shooting/tingling pain.  No SOB, palpitations, or chest pressure.   Hip Pain  The incident occurred more than 1 week ago (pain progressed over last 2 weeks). There was no injury mechanism. The pain is present in the right hip and right thigh. The quality of the pain is described as aching and burning. The pain is moderate. The pain has been constant since onset. Associated symptoms include tingling. The symptoms are aggravated by movement and weight bearing. She has tried NSAIDs and rest for the symptoms. The treatment provided mild relief.      Review of Systems  Musculoskeletal: Positive for back pain and arthralgias.  Neurological: Positive for tingling.  All other systems reviewed and are negative.      Objective:   Physical Exam  Constitutional: She is oriented to person, place, and time.  Cardiovascular: Normal rate, regular rhythm and normal heart sounds.   Pulmonary/Chest: Effort normal and breath sounds normal.  Musculoskeletal:  Limited range of motion of right hip, Pain with passive range of motion  Neurological: She is alert and oriented to person, place, and time.  Skin: Skin is warm and dry.      Assessment & Plan:  Toni Gutierrez was seen today for follow-up.  Diagnoses and all orders for this visit:  Essential hypertension Orders: -    Refill lisinopril-hydrochlorothiazide (PRINZIDE,ZESTORETIC) 10-12.5 MG per tablet; Take 1 tablet by mouth daily. -     Lipid panel; Future  Gastroesophageal reflux disease, esophagitis presence not specified Orders: -     Refill omeprazole (PRILOSEC) 40 MG capsule; Take 1 capsule (40 mg total) by mouth daily. -     CBC; Future -     COMPLETE METABOLIC  PANEL WITH GFR; Future  Arthritis Orders: -     acetaminophen-codeine (TYLENOL #3) 300-30 MG per tablet; Take 1 tablet by mouth every 8 (eight) hours as needed for moderate pain. -     cyclobenzaprine (FLEXERIL) 10 MG tablet; Take 1 tablet (10 mg total) by mouth 3 (three) times daily as needed for muscle spasms. -     Vitamin D, 25-hydroxy; Future  Breast cancer screening Orders: -     MM DIGITAL SCREENING BILATERAL; Future Gave patient information to [facility] program  Return in about 3 months (around 08/04/2014) for Hypertension.  Chari Manning, NP 6:12 PM 05/04/2014

## 2014-05-04 NOTE — Patient Instructions (Addendum)
Please call Rolena Infante, [phone],  with the [facility] (breast and cervical cancer control program) at the [facility] to set up an appointment to verify eligibility for a breast exam, mammogram, ultrasound. If you qualify this will be set up at [place]la stands for "Dietary Approaches to Stop Hypertension." The DASH eating plan is a healthy eating plan that has been shown to reduce high blood pressure (hypertension). Additional health benefits may include reducing the risk of type 2 diabetes mellitus, heart disease, and stroke. The DASH eating plan may also help with weight loss. WHAT DO I NEED TO KNOW ABOUT THE DASH EATING PLAN? For the DASH eating plan, you will follow these general guidelines:  Choose foods with a percent daily value for sodium of less than 5% (as listed on the food label).  Use salt-free seasonings or herbs instead of table salt or sea salt.  Check with your health care provider or pharmacist before using salt substitutes.  Eat lower-sodium products, often labeled as "lower sodium" or "no salt added."  Eat fresh foods.  Eat more vegetables, fruits, and low-fat dairy products.  Choose whole grains. Look for the word "whole" as the first word in the ingredient list.  Choose fish and skinless chicken or [place] more often than red meat. Limit fish, poultry, and meat to 6 oz (170 g) each day.  Limit sweets, desserts, sugars, and sugary drinks.  Choose heart-healthy fats.  Limit cheese to 1 oz (28 g) per day.  Eat more home-cooked food and less restaurant, buffet, and fast food.  Limit fried foods.  Cook foods using methods other than frying.  Limit canned vegetables. If you do use them, rinse them well to decrease the sodium.  When eating at a restaurant, ask that your food be prepared with less salt, or no salt if possible. WHAT FOODS CAN I EAT? Seek help from a dietitian for individual calorie needs. Grains Whole grain  or whole wheat bread. Brown rice. Whole grain or whole wheat pasta. Quinoa, bulgur, and whole grain cereals. Low-sodium cereals. Corn or whole wheat flour tortillas. Whole grain cornbread. Whole grain crackers. Low-sodium crackers. Vegetables Fresh or frozen vegetables (raw, steamed, roasted, or grilled). Low-sodium or reduced-sodium tomato and vegetable juices. Low-sodium or reduced-sodium tomato sauce and paste. Low-sodium or reduced-sodium canned vegetables.  Fruits All fresh, canned (in natural juice), or frozen fruits. Meat and Other Protein Products Ground beef (85% or leaner), grass-fed beef, or beef trimmed of fat. Skinless chicken or [place]. Ground chicken or [place]. Pork trimmed of fat. All fish and seafood. Eggs. Dried beans, peas, or lentils. Unsalted nuts and seeds. Unsalted canned beans. Dairy Low-fat dairy products, such as skim or 1% milk, 2% or reduced-fat cheeses, low-fat ricotta or cottage cheese, or plain low-fat yogurt. Low-sodium or reduced-sodium cheeses. Fats and Oils Tub margarines without trans fats. Light or reduced-fat mayonnaise and salad dressings (reduced sodium). Avocado. Safflower, olive, or canola oils. Natural peanut or almond butter. Other Unsalted popcorn and pretzels. The items listed above may not be a complete list of recommended foods or beverages. Contact your dietitian for more options. WHAT FOODS ARE NOT RECOMMENDED? Grains White bread. White pasta. White rice. Refined cornbread. Bagels and croissants. Crackers that contain trans fat. Vegetables Creamed or fried vegetables. Vegetables in a cheese sauce. Regular canned vegetables. Regular canned tomato sauce and paste. Regular tomato and vegetable juices. Fruits Dried fruits. Canned fruit in light or heavy syrup. Fruit juice.  Meat and Other Protein Products Fatty cuts of meat. Ribs, chicken wings, bacon, sausage, bologna, salami, chitterlings, fatback, hot dogs, bratwurst, and packaged luncheon meats.  Salted nuts and seeds. Canned beans with salt. Dairy Whole or 2% milk, cream, half-and-half, and cream cheese. Whole-fat or sweetened yogurt. Full-fat cheeses or blue cheese. Nondairy creamers and whipped toppings. Processed cheese, cheese spreads, or cheese curds. Condiments Onion and garlic salt, seasoned salt, table salt, and sea salt. Canned and packaged gravies. Worcestershire sauce. Tartar sauce. Barbecue sauce. Teriyaki sauce. Soy sauce, including reduced sodium. Steak sauce. Fish sauce. Oyster sauce. Cocktail sauce. Horseradish. Ketchup and mustard. Meat flavorings and tenderizers. Bouillon cubes. Hot sauce. Tabasco sauce. Marinades. Taco seasonings. Relishes. Fats and Oils Butter, stick margarine, lard, shortening, ghee, and bacon fat. Coconut, palm kernel, or palm oils. Regular salad dressings. Other Pickles and olives. Salted popcorn and pretzels. The items listed above may not be a complete list of foods and beverages to avoid. Contact your dietitian for more information. WHERE CAN I FIND MORE INFORMATION? National Heart, Lung, and Blood Institute: travelstabloid.com Document Released: 01/29/2011 Document Revised: 06/26/2013 Document Reviewed: 12/14/2012 [facility] Patient Information 2015 [place], [place]. This information is not intended to replace advice given to you by your health care provider. Make sure you discuss any questions you have with your health care provider.

## 2014-05-04 NOTE — Addendum Note (Signed)
Addended by: Chari Manning A on: 05/04/2014 11:36 AM   Modules accepted: Orders

## 2014-05-04 NOTE — Progress Notes (Signed)
F/U shoulder, leg and hip pain Pain worsen with standing and walking

## 2014-05-16 ENCOUNTER — Telehealth (HOSPITAL_COMMUNITY): Payer: Self-pay | Admitting: *Deleted

## 2014-05-16 NOTE — Telephone Encounter (Signed)
Telephoned patient at home # and left message to return call to BCCCP 

## 2014-07-05 ENCOUNTER — Other Ambulatory Visit: Payer: Self-pay | Admitting: Internal Medicine

## 2014-07-26 ENCOUNTER — Other Ambulatory Visit: Payer: Self-pay | Admitting: Internal Medicine

## 2014-10-10 ENCOUNTER — Telehealth: Payer: Self-pay

## 2014-10-11 ENCOUNTER — Ambulatory Visit: Payer: Self-pay | Admitting: Internal Medicine

## 2014-10-22 ENCOUNTER — Ambulatory Visit: Payer: Self-pay | Admitting: Internal Medicine

## 2015-02-14 NOTE — Telephone Encounter (Signed)
error 

## 2015-02-20 DIAGNOSIS — I158 Other secondary hypertension: Secondary | ICD-10-CM | POA: Diagnosis not present

## 2015-02-20 DIAGNOSIS — M545 Low back pain: Secondary | ICD-10-CM | POA: Diagnosis not present

## 2015-02-20 DIAGNOSIS — Z79891 Long term (current) use of opiate analgesic: Secondary | ICD-10-CM | POA: Diagnosis not present

## 2015-03-22 DIAGNOSIS — I158 Other secondary hypertension: Secondary | ICD-10-CM | POA: Diagnosis not present

## 2015-03-22 DIAGNOSIS — M545 Low back pain: Secondary | ICD-10-CM | POA: Diagnosis not present

## 2015-04-12 MED FILL — LISINOPRIL-HCTZ 10-12.5 MG: 10-12.5 | 30 days supply | Qty: 30 | Fill #6

## 2015-04-12 MED FILL — OMEPRAZOLE DR 20 MG CAPSULE: 20 | 30 days supply | Qty: 60 | Fill #5

## 2015-04-25 ENCOUNTER — Encounter (HOSPITAL_COMMUNITY): Payer: Self-pay | Admitting: Emergency Medicine

## 2015-04-25 ENCOUNTER — Emergency Department (INDEPENDENT_AMBULATORY_CARE_PROVIDER_SITE_OTHER)
Admission: EM | Admit: 2015-04-25 | Discharge: 2015-04-25 | Disposition: A | Discharge status: Home / Self Care | Payer: Medicare Other | Source: Home / Self Care | Attending: Family Medicine | Admitting: Family Medicine

## 2015-04-25 DIAGNOSIS — R51 Headache: Secondary | ICD-10-CM

## 2015-04-25 DIAGNOSIS — G8929 Other chronic pain: Secondary | ICD-10-CM

## 2015-04-25 DIAGNOSIS — S46812A Strain of other muscles, fascia and tendons at shoulder and upper arm level, left arm, initial encounter: Secondary | ICD-10-CM | POA: Diagnosis not present

## 2015-04-25 MED ORDER — MELOXICAM 15 MG PO TABS: 15.0000 mg | ORAL_TABLET | Freq: Every day | ORAL | Status: DC

## 2015-04-25 NOTE — Discharge Instructions (Signed)
Muscle Strain A muscle strain is an injury that occurs when a muscle is stretched beyond its normal length. Usually a small number of muscle fibers are torn when this happens. Muscle strain is rated in degrees. First-degree strains have the least amount of muscle fiber tearing and pain. Second-degree and third-degree strains have increasingly more tearing and pain.  Usually, recovery from muscle strain takes 1-2 weeks. Complete healing takes 5-6 weeks.  CAUSES  Muscle strain happens when a sudden, violent force placed on a muscle stretches it too far. This may occur with lifting, sports, or a fall.  RISK FACTORS Muscle strain is especially common in athletes.  SIGNS AND SYMPTOMS At the site of the muscle strain, there may be:  Pain.  Bruising.  Swelling.  Difficulty using the muscle due to pain or lack of normal function. DIAGNOSIS  Your health care provider will perform a physical exam and ask about your medical history. TREATMENT  Often, the best treatment for a muscle strain is resting, icing, and applying cold compresses to the injured area.  HOME CARE INSTRUCTIONS   Use the PRICE method of treatment to promote muscle healing during the first 2-3 days after your injury. The PRICE method involves:  Protecting the muscle from being injured again.  Restricting your activity and resting the injured body part.  Icing your injury. To do this, put ice in a plastic bag. Place a towel between your skin and the bag. Then, apply the ice and leave it on from 15-20 minutes each hour. After the third day, switch to moist heat packs.  Apply compression to the injured area with a splint or elastic bandage. Be careful not to wrap it too tightly. This may interfere with blood circulation or increase swelling.  Elevate the injured body part above the level of your heart as often as you can.  Only take over-the-counter or prescription medicines for pain, discomfort, or fever as directed by your  health care provider.  Warming up prior to exercise helps to prevent future muscle strains. SEEK MEDICAL CARE IF:   You have increasing pain or swelling in the injured area.  You have numbness, tingling, or a significant loss of strength in the injured area. MAKE SURE YOU:   Understand these instructions.  Will watch your condition.  Will get help right away if you are not doing well or get worse.   This information is not intended to replace advice given to you by your health care provider. Make sure you discuss any questions you have with your health care provider.   Document Released: 02/09/2005 Document Revised: 11/30/2012 Document Reviewed: 09/08/2012 Elsevier Interactive Patient Education 2016 [place] Headache Without Cause A headache is pain or discomfort felt around the head or neck area. There are many causes and types of headaches. In some cases, the cause may not be found.  HOME CARE  Managing Pain  Take over-the-counter and prescription medicines only as told by your doctor.  Lie down in a dark, quiet room when you have a headache.  If directed, apply ice to the head and neck area:  Put ice in a plastic bag.  Place a towel between your skin and the bag.  Leave the ice on for 20 minutes, 2-3 times per day.  Use a heating pad or hot shower to apply heat to the head and neck area as told by your doctor.  Keep lights dim if bright lights bother you or make your headaches  worse. Eating and Drinking  Eat meals on a regular schedule.  Lessen how much alcohol you drink.  Lessen how much caffeine you drink, or stop drinking caffeine. General Instructions  Keep all follow-up visits as told by your doctor. This is important.  Keep a journal to find out if certain things bring on headaches. For example, write down:  What you eat and drink.  How much sleep you get.  Any change to your diet or medicines.  Relax by getting a massage or doing  other relaxing activities.  Lessen stress.  Sit up straight. Do not tighten (tense) your muscles.  Do not use tobacco products. This includes cigarettes, chewing tobacco, or e-cigarettes. If you need help quitting, ask your doctor.  Exercise regularly as told by your doctor.  Get enough sleep. This often means 7-9 hours of sleep. GET HELP IF:  Your symptoms are not helped by medicine.  You have a headache that feels different than the other headaches.  You feel sick to your stomach (nauseous) or you throw up (vomit).  You have a fever. GET HELP RIGHT AWAY IF:   Your headache becomes really bad.  You keep throwing up.  You have a stiff neck.  You have trouble seeing.  You have trouble speaking.  You have pain in the eye or ear.  Your muscles are weak or you lose muscle control.  You lose your balance or have trouble walking.  You feel like you will pass out (faint) or you pass out.  You have confusion.   This information is not intended to replace advice given to you by your health care provider. Make sure you discuss any questions you have with your health care provider.   Document Released: 11/19/2007 Document Revised: 10/31/2014 Document Reviewed: 06/04/2014 Elsevier Interactive Patient Education [employer].

## 2015-04-25 NOTE — ED Provider Notes (Signed)
CSN: [id number]     Arrival date & time 04/25/15  1840 History   First MD Initiated Contact with Patient 04/25/15 2013     Chief Complaint  Patient presents with  . Shoulder Pain  . Headache   (Consider location/radiation/quality/duration/timing/severity/associated sxs/prior Treatment) HPI Comments: 55-year-old female presents with left shoulder pain. Denies any known injury. The pain is located primarily to the left trapezius muscle. Pain started several months ago. It is chronic and intermittent.  Patient is also complaining of frontal headache for several days. Denies any known neurologic symptoms. Denies problems vision, speech, hearing, swallowing, focal paresthesias or weakness. Denies problems with memory or orientation.  Patient has chronic pain including chronic headaches and chronic shoulder pain.  the problem listed on her chart includes chronic pain for several areas including the headache. She receives monthly prescriptions for Norco with the last being 3 days ago where she received 90. She denied having medication for pain. She denied seeing her physicians for her musculoskeletal pain or headache.    Past Medical History  Diagnosis Date  . Hypertension   . Hyperlipidemia   . Shoulder pain, bilateral     MRI right shoulder showing tear of infraspinatus tendon with bursal surface fraying of the infraspinatus  . Chronic lower back pain     MRI of cervical, lumbar spine (all obtained by Dr. Ronnie Derby in 2006)- showing mild DDD with bulges but without disc herniation, at multiple levels; mild facet joint dz, primarily at L3-4 and L4-5 bilaterally  . Left knee pain     s/p ACL reconstruction by Dr. Ronnie Derby  . Carpal tunnel syndrome, right   . Chronic headache     Thought to be 2/2 cervical DJD vs anxiety. CT head in 2010 without any significant findings  . Allergic rhinitis   . Arthritis   . Alcohol abuse     No alcohol intake since 2001   Past Surgical History  Procedure  Laterality Date  . Tubal ligation  1989  . Anterior cruciate ligament repair      By Dr. Ronnie Derby  . Rotator cuff repair      Right shoulder   Family History  Problem Relation Age of Onset  . Hypertension Father    Social History  Substance Use Topics  . Smoking status: Never Smoker   . Smokeless tobacco: None  . Alcohol Use: No   OB History    No data available     Review of Systems  Constitutional: Positive for activity change.  Respiratory: Negative.   Musculoskeletal: Negative for back pain and gait problem.  Skin: Negative.   Neurological: Positive for headaches. Negative for dizziness, seizures, syncope, speech difficulty, light-headedness and numbness.  All other systems reviewed and are negative.   Allergies  Review of patient's allergies indicates no known allergies.  Home Medications   Prior to Admission medications   Medication Sig Start Date End Date Taking? Authorizing Provider  lisinopril-hydrochlorothiazide (PRINZIDE,ZESTORETIC) 10-12.5 MG per tablet Take 1 tablet by mouth daily. 05/04/14  Yes Lance Bosch, NP  ranitidine (ZANTAC) 150 MG tablet TAKE 2 TABLETS BY MOUTH NIGHTLY AT BEDTIME 03/07/14  Yes Lance Bosch, NP  acetaminophen-codeine (TYLENOL #3) 300-30 MG per tablet TAKE 1 TABLET BY MOUTH EVERY 8 HOURS AS NEEDED FOR MODERATE PAIN 07/13/14   Tresa Garter, MD  cyclobenzaprine (FLEXERIL) 10 MG tablet Take 1 tablet (10 mg total) by mouth 3 (three) times daily as needed for muscle spasms. 05/04/14   Mateo Flow  [place], NP  meloxicam (MOBIC) 15 MG tablet Take 1 tablet (15 mg total) by mouth daily. 04/25/15   Janne Napoleon, NP  omeprazole (PRILOSEC) 40 MG capsule Take 1 capsule (40 mg total) by mouth daily. 05/04/14   Lance Bosch, NP   Meds Ordered and Administered this Visit  Medications - No data to display  BP 147/94 mmHg  Pulse 75  Temp(Src) 97.8 F (36.6 C) (Oral)  Resp 16  SpO2 100% No data found.   Physical Exam  Constitutional: She is  oriented to person, place, and time. She appears well-developed and well-nourished. No distress.  Eyes: Conjunctivae and EOM are normal. Pupils are equal, round, and reactive to light.  Neck: Normal range of motion. Neck supple.  Cardiovascular: Normal rate.   Pulmonary/Chest: Effort normal. No respiratory distress.  Musculoskeletal:  Tenderness along the trapezius and supraspinatus muscle. Lesser tenderness to the deltoid. No shoulder joint line tenderness. Abduction to 80. Distal neurovascular motor Sentry is intact.  Neurological: She is alert and oriented to person, place, and time. She has normal strength. No cranial nerve deficit or sensory deficit. She exhibits normal muscle tone. Coordination normal.  Skin: Skin is warm and dry.  Nursing note and vitals reviewed.   ED Course  Procedures (including critical care time)  Labs Review Labs Reviewed - No data to display  Imaging Review No results found.   Visual Acuity Review  Right Eye Distance:   Left Eye Distance:   Bilateral Distance:    Right Eye Near:   Left Eye Near:    Bilateral Near:         MDM   1. Trapezius muscle strain, left, initial encounter   2. Chronic nonintractable headache, unspecified headache type    Suspect drug seeking. Pt received norco 5mg  #90 3 days ago and denied it. Hx of ETOH and drug abuse and chronic monthly opioid Rx's. Rx for Mobic 15 mg.  [place] Controlled Sub Reporting System reviewed.    Janne Napoleon, NP 04/25/15 2046

## 2015-04-25 NOTE — ED Notes (Signed)
The patient presented to the [facility] with a complaint of left shoulder pain and a headache. The patient stated that her shoulder has been hurting "for a while" but is getting worse. She denied any known injury.

## 2015-05-14 ENCOUNTER — Other Ambulatory Visit: Payer: Self-pay | Admitting: Internal Medicine

## 2015-05-14 MED FILL — LISINOPRIL-HCTZ 10-12.5 MG: 10-12.5 | 30 days supply | Qty: 30 | Fill #0

## 2015-05-14 MED FILL — OMEPRAZOLE DR 20 MG CAPSULE: 20 | 30 days supply | Qty: 60 | Fill #0

## 2015-05-20 DIAGNOSIS — Z79891 Long term (current) use of opiate analgesic: Secondary | ICD-10-CM | POA: Diagnosis not present

## 2015-05-20 DIAGNOSIS — I158 Other secondary hypertension: Secondary | ICD-10-CM | POA: Diagnosis not present

## 2015-05-20 DIAGNOSIS — M545 Low back pain: Secondary | ICD-10-CM | POA: Diagnosis not present

## 2015-06-11 ENCOUNTER — Encounter: Payer: Self-pay | Admitting: Family Medicine

## 2015-06-11 ENCOUNTER — Ambulatory Visit (INDEPENDENT_AMBULATORY_CARE_PROVIDER_SITE_OTHER): Payer: Medicare Other | Admitting: Family Medicine

## 2015-06-11 VITALS — BP 125/83 | HR 89 | Temp 98.0°F | Ht 59.0 in | Wt 136.0 lb

## 2015-06-11 DIAGNOSIS — I10 Essential (primary) hypertension: Secondary | ICD-10-CM

## 2015-06-11 DIAGNOSIS — F32A Depression, unspecified: Secondary | ICD-10-CM

## 2015-06-11 DIAGNOSIS — F329 Major depressive disorder, single episode, unspecified: Secondary | ICD-10-CM

## 2015-06-11 DIAGNOSIS — M25511 Pain in right shoulder: Secondary | ICD-10-CM | POA: Diagnosis not present

## 2015-06-11 DIAGNOSIS — G8929 Other chronic pain: Secondary | ICD-10-CM | POA: Diagnosis not present

## 2015-06-11 DIAGNOSIS — G629 Polyneuropathy, unspecified: Secondary | ICD-10-CM

## 2015-06-11 DIAGNOSIS — M25519 Pain in unspecified shoulder: Secondary | ICD-10-CM | POA: Diagnosis not present

## 2015-06-11 DIAGNOSIS — Z8739 Personal history of other diseases of the musculoskeletal system and connective tissue: Secondary | ICD-10-CM | POA: Diagnosis not present

## 2015-06-11 DIAGNOSIS — E663 Overweight: Secondary | ICD-10-CM

## 2015-06-11 DIAGNOSIS — Z1239 Encounter for other screening for malignant neoplasm of breast: Secondary | ICD-10-CM

## 2015-06-11 LAB — POCT URINALYSIS DIPSTICK
BILIRUBIN UA: NEGATIVE
Glucose, UA: NEGATIVE
KETONES UA: NEGATIVE
LEUKOCYTES UA: NEGATIVE
Nitrite, UA: NEGATIVE
PH UA: 7
Protein, UA: NEGATIVE
Spec Grav, UA: <=1.005
Urobilinogen, UA: NEGATIVE

## 2015-06-11 MED ORDER — GABAPENTIN 100 MG PO CAPS: 100.0000 mg | ORAL_CAPSULE | Freq: Three times a day (TID) | ORAL | Status: DC

## 2015-06-11 MED ORDER — DULOXETINE HCL 30 MG PO CPEP: 30.0000 mg | ORAL_CAPSULE | Freq: Every day | ORAL | Status: DC

## 2015-06-11 NOTE — Progress Notes (Signed)
Subjective:    Patient ID: Toni Gutierrez, female    DOB: [birth year], 55 y.o.   MRN: [id number]  HPI  Toni Gutierrez, a 55 year old female presents to establish care. She states that she has not had a primary provider. She has primarily been using the emergency department and urgent care for all primary care needs. Toni Gutierrez is complaining of chronic shoulder pain. She had right shoulder pain several years ago. She says that she had a right rotator cuff repair in 2007. 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 She denies shoulder injury or direct trauma. She has been taking OTC Tylenol and Ibuprofen with minimal relief. She was previously prescribed Meloxicam 15 mg and has not been taking medication consistently. She denies fever, nausea, fatigue. However, she endorses numbness and tingling to upper extremities. Her currnet pain intensity is 6/10 to bilateral shoulders described as aching and intermittent. Pain is worsened by increased activity. Pain is minimally decreased by rest.    Ms. [name] also has a history of hypertension. Hypertension is controlled on Lisinopril-hydrochlorothiazide. She is taking medications consistently. She has not been exercising or following a low fat diet. She also does not check blood pressures at home.  Patient denies chest pain, claudication, dyspnea, fatigue, irregular heart beat, lower extremity edema, orthopnea and palpitations.  Cardiovascular risk factors include: dyslipidemia and sedentary lifestyle.    Patient also complains of depression. She states that she does has been living with her family and has lost her home.  She complains of depressed mood, difficulty concentrating, fatigue and hopelessness.She denies current suicidal and homicidal plan or intent.   She says that symptoms have been present for several months.  Toni Gutierrez  Past Medical History  Diagnosis Date  . Hypertension   .  Hyperlipidemia   . Shoulder pain, bilateral     MRI right shoulder showing tear of infraspinatus tendon with bursal surface fraying of the infraspinatus  . Chronic lower back pain     MRI of cervical, lumbar spine (all obtained by Dr. Ronnie Derby in 2006)- showing mild DDD with bulges but without disc herniation, at multiple levels; mild facet joint dz, primarily at L3-4 and L4-5 bilaterally  . Left knee pain     s/p ACL reconstruction by Dr. Ronnie Derby  . Carpal tunnel syndrome, right   . Chronic headache     Thought to be 2/2 cervical DJD vs anxiety. CT head in 2010 without any significant findings  . Allergic rhinitis   . Arthritis   . Alcohol abuse     No alcohol intake since 2001   Immunization History  Administered Date(s) Administered  . Influenza Split 03/17/2012, 11/17/2012  . Influenza Whole 12/23/2005, 12/23/2006, 11/21/2008  . Tdap 03/27/2010   Depression screen [facility] 2/9 06/11/2015 06/11/2015 05/04/2014 11/17/2012 08/12/2012  Decreased Interest 2 2 2 3  0  Down, Depressed, Hopeless 1 1 1 3  0  PHQ - 2 Score 3 3 3 6  0  Altered sleeping 1 1 3 3  -  Tired, decreased energy 1 1 2 3  -  Change in appetite 1 2 3 3  -  Feeling bad or failure about yourself  - 1 0 3 -  Trouble concentrating - 1 0 3 -  Moving slowly or fidgety/restless - 1 3 3  -  Suicidal thoughts - 0 0 0 -  PHQ-9 Score 6 10 14 24  -  Difficult doing work/chores Not difficult at all Not  difficult at all - - -    Review of Systems  Constitutional: Negative for fever and fatigue.  Eyes: Negative.  Negative for photophobia and visual disturbance.  Respiratory: Negative.   Cardiovascular: Negative.   Gastrointestinal: Negative.   Endocrine: Negative.  Negative for polydipsia, polyphagia and polyuria.  Musculoskeletal: Positive for myalgias (Bilateral shoulder pain), back pain and arthralgias.  Skin: Negative.   Allergic/Immunologic: Negative.   Neurological: Negative.  Negative for dizziness.  Hematological: Negative.    Psychiatric/Behavioral: Negative.        Depression       Objective:   Physical Exam  Constitutional: She is oriented to person, place, and time. She appears well-developed and well-nourished.  HENT:  Head: Normocephalic and atraumatic.  Right Ear: External ear normal.  Left Ear: External ear normal.  Mouth/Throat: Oropharynx is clear and moist.  Eyes: Conjunctivae and EOM are normal. Pupils are equal, round, and reactive to light.  Neck: Normal range of motion. Neck supple.  Cardiovascular: Normal rate, regular rhythm, normal heart sounds and intact distal pulses.   Pulmonary/Chest: Effort normal and breath sounds normal.  Abdominal: Soft. Bowel sounds are normal.  Musculoskeletal:       Right shoulder: She exhibits decreased range of motion, pain and decreased strength (Grade 2 muscle strength). She exhibits no spasm.       Left shoulder: She exhibits decreased range of motion, pain and decreased strength (Grade 2 muscle strength).  Neurological: She is alert and oriented to person, place, and time.  Skin: Skin is warm and dry.  Psychiatric: She has a normal mood and affect. Her behavior is normal. Judgment and thought content normal.      BP 125/83 mmHg  Pulse 89  Temp(Src) 98 F (36.7 C) (Oral)  Ht 4\' 11"  (1.499 m)  Wt 136 lb (61.689 kg)  BMI 27.45 kg/m2  SpO2 100% Assessment & Plan:W  1. Chronic shoulder pain, right Will follow up with patient by phone with laboratory results. Patient warrants further evaluation by orthopedic specialist due to history and complaints of neuropathy.  - Sedimentation Rate - Rheumatoid factor - C-reactive protein - AMB referral to orthopedics  2. Neuropathy ([place]) Will start a trial of Gabapentin. Will follow up in 1 month.  - gabapentin (NEURONTIN) 100 MG capsule; Take 1 capsule (100 mg total) by mouth 3 (three) times daily.  Dispense: 90 capsule; Refill: 0  3. History of arthritis  - Sedimentation Rate  4. Depression Patient  currently denies suicidal or homicidal intent. Will send a referral to psychologist.  - DULoxetine (CYMBALTA) 30 MG capsule; Take 1 capsule (30 mg total) by mouth daily.  Dispense: 30 capsule; Refill: 0  5. Overweight (BMI 25.0-29.9) Recommend a lowfat, low carbohydrate diet divided over 5-6 small meals, increase water intake to 6-8 glasses, and 150 minutes per week of cardiovascular exercise.   - Lipid Panel  6. Essential hypertension Blood pressure is at goal on medication regimen. Will continue medication at current dosage.  - COMPLETE METABOLIC PANEL WITH GFR - POCT urinalysis dipstick - Ambulatory referral to Ophthalmology  Routine Health Maintenance:  Recommend screening mammogram Recommend pap smear Recommend routine dental exam    Kasyn Stouffer M, FNP     The patient was given clear instructions to go to ER or return to medical center if symptoms do not improve, worsen or new problems develop. The patient verbalized understanding. Will notify patient with laboratory results.

## 2015-06-11 NOTE — Patient Instructions (Addendum)
Will start a trial of Cymbalta 20 mg daily for depression.  Will also start a trial of Gabapentin 100 mg three times per day for numbness to upper extremities Sent a referral to orthopedic physician for bilateral shoulder pain Also, sent a referral to ophthalmologist for eye examDuloxetine delayed-release capsules What is this medicine? DULOXETINE (doo LOX e teen) is used to treat depression, anxiety, and different types of chronic pain. This medicine may be used for other purposes; ask your health care provider or pharmacist if you have questions. What should I tell my health care provider before I take this medicine? They need to know if you have any of these conditions: -bipolar disorder or a family history of bipolar disorder -glaucoma -kidney disease -liver disease -suicidal thoughts or a previous suicide attempt -taken medicines called MAOIs like Carbex, Eldepryl, Marplan, Nardil, and Parnate within 14 days -an unusual reaction to duloxetine, other medicines, foods, dyes, or preservatives -pregnant or trying to get pregnant -breast-feeding How should I use this medicine? Take this medicine by mouth with a glass of water. Follow the directions on the prescription label. Do not cut, crush or chew this medicine. You can take this medicine with or without food. Take your medicine at regular intervals. Do not take your medicine more often than directed. Do not stop taking this medicine suddenly except upon the advice of your doctor. Stopping this medicine too quickly may cause serious side effects or your condition may worsen. A special MedGuide will be given to you by the pharmacist with each prescription and refill. Be sure to read this information carefully each time. Talk to your pediatrician regarding the use of this medicine in children. While this drug may be prescribed for children as young as 43 years of age for selected conditions, precautions do apply. Overdosage: If you think you  have taken too much of this medicine contact a poison control center or emergency room at once. NOTE: This medicine is only for you. Do not share this medicine with others. What if I miss a dose? If you miss a dose, take it as soon as you can. If it is almost time for your next dose, take only that dose. Do not take double or extra doses. What may interact with this medicine? Do not take this medicine with any of the following medications: -certain diet drugs like dexfenfluramine, fenfluramine -desvenlafaxine -linezolid -MAOIs like Azilect, Carbex, Eldepryl, Marplan, Nardil, and Parnate -methylene blue (intravenous) -milnacipran -thioridazine -venlafaxine This medicine may also interact with the following medications: -alcohol -aspirin and aspirin-like medicines -certain antibiotics like ciprofloxacin and enoxacin -certain medicines for blood pressure, heart disease, irregular heart beat -certain medicines for depression, anxiety, or psychotic disturbances -certain medicines for migraine headache like almotriptan, eletriptan, frovatriptan, naratriptan, rizatriptan, sumatriptan, zolmitriptan -certain medicines that treat or prevent blood clots like warfarin, enoxaparin, and dalteparin -cimetidine -fentanyl -lithium -NSAIDS, medicines for pain and inflammation, like ibuprofen or naproxen -phentermine -procarbazine -sibutramine -St. John's wort -theophylline -tramadol -tryptophan This list may not describe all possible interactions. Give your health care provider a list of all the medicines, herbs, non-prescription drugs, or dietary supplements you use. Also tell them if you smoke, drink alcohol, or use illegal drugs. Some items may interact with your medicine. What should I watch for while using this medicine? Tell your doctor if your symptoms do not get better or if they get worse. Visit your doctor or health care professional for regular checks on your progress. Because it may take  several  weeks to see the full effects of this medicine, it is important to continue your treatment as prescribed by your doctor. Patients and their families should watch out for new or worsening thoughts of suicide or depression. Also watch out for sudden changes in feelings such as feeling anxious, agitated, panicky, irritable, hostile, aggressive, impulsive, severely restless, overly excited and hyperactive, or not being able to sleep. If this happens, especially at the beginning of treatment or after a change in dose, call your health care professional. Dennis Bast may get drowsy or dizzy. Do not drive, use machinery, or do anything that needs mental alertness until you know how this medicine affects you. Do not stand or sit up quickly, especially if you are an older patient. This reduces the risk of dizzy or fainting spells. Alcohol may interfere with the effect of this medicine. Avoid alcoholic drinks. This medicine can cause an increase in blood pressure. This medicine can also cause a sudden drop in your blood pressure, which may make you feel faint and increase the chance of a fall. These effects are most common when you first start the medicine or when the dose is increased, or during use of other medicines that can cause a sudden drop in blood pressure. Check with your doctor for instructions on monitoring your blood pressure while taking this medicine. Your mouth may get dry. Chewing sugarless gum or sucking hard candy, and drinking plenty of water may help. Contact your doctor if the problem does not go away or is severe. What side effects may I notice from receiving this medicine? Side effects that you should report to your doctor or health care professional as soon as possible: -allergic reactions like skin rash, itching or hives, swelling of the face, lips, or tongue -changes in blood pressure -confusion -dark urine -dizziness -fast talking and excited feelings or actions that are out of  control -fast, irregular heartbeat -fever -general ill feeling or flu-like symptoms -hallucination, loss of contact with reality -light-colored stools -loss of balance or coordination -redness, blistering, peeling or loosening of the skin, including inside the mouth -right upper belly pain -seizures -suicidal thoughts or other mood changes -trouble concentrating -trouble passing urine or change in the amount of urine -unusual bleeding or bruising -unusually weak or tired -yellowing of the eyes or skin Side effects that usually do not require medical attention (report to your doctor or health care professional if they continue or are bothersome): -blurred vision -change in appetite -change in sex drive or performance -headache -increased sweating -nausea This list may not describe all possible side effects. Call your doctor for medical advice about side effects. You may report side effects to FDA at 1-800-FDA-1088. Where should I keep my medicine? Keep out of the reach of children. Store at room temperature between 20 and 25 degrees C (68 to 77 degrees F). Throw away any unused medicine after the expiration date. NOTE: This sheet is a summary. It may not cover all possible information. If you have questions about this medicine, talk to your doctor, pharmacist, or health care provider.    2016, Elsevier/Gold Standard. (2013-01-31 16:28:32) Neuropathic Pain Neuropathic pain is pain caused by damage to the nerves that are responsible for certain sensations in your body (sensory nerves). The pain can be caused by damage to:   The sensory nerves that send signals to your spinal cord and brain (peripheral nervous system).  The sensory nerves in your brain or spinal cord (central nervous system). Neuropathic pain can make  you more sensitive to pain. What would be a minor sensation for most people may feel very painful if you have neuropathic pain. This is usually a long-term condition  that can be difficult to treat. The type of pain can differ from person to person. It may start suddenly (acute), or it may develop slowly and last for a long time (chronic). Neuropathic pain may come and go as damaged nerves heal or may stay at the same level for years. It often causes emotional distress, loss of sleep, and a lower quality of life. CAUSES  The most common cause of damage to a sensory nerve is diabetes. Many other diseases and conditions can also cause neuropathic pain. Causes of neuropathic pain can be classified as:  Toxic. Many drugs and chemicals can cause toxic damage. The most common cause of toxic neuropathic pain is damage from drug treatment for cancer (chemotherapy).  Metabolic. This type of pain can happen when a disease causes imbalances that damage nerves. Diabetes is the most common of these diseases. Vitamin B deficiency caused by long-term alcohol abuse is another common cause.  Traumatic. Any injury that cuts, crushes, or stretches a nerve can cause damage and pain. A common example is feeling pain after losing an arm or leg (phantom limb pain).  Compression-related. If a sensory nerve gets trapped or compressed for a long period of time, the blood supply to the nerve can be cut off.  Vascular. Many blood vessel diseases can cause neuropathic pain by decreasing blood supply and oxygen to nerves.  Autoimmune. This type of pain results from diseases in which the body's defense system mistakenly attacks sensory nerves. Examples of autoimmune diseases that can cause neuropathic pain include lupus and multiple sclerosis.  Infectious. Many types of viral infections can damage sensory nerves and cause pain. Shingles infection is a common cause of this type of pain.  Inherited. Neuropathic pain can be a symptom of many diseases that are passed down through families (genetic). SIGNS AND SYMPTOMS  The main symptom is pain. Neuropathic pain is often described  as:  Burning.  Shock-like.  Stinging.  Hot or cold.  Itching. DIAGNOSIS  No single test can diagnose neuropathic pain. Your health care provider will do a physical exam and ask you about your pain. You may use a pain scale to describe how bad your pain is. You may also have tests to see if you have a high sensitivity to pain and to help find the cause and location of any sensory nerve damage. These tests may include:  Imaging studies, such as:  X-rays.  CT scan.  MRI.  Nerve conduction studies to test how well nerve signals travel through your sensory nerves (electrodiagnostic testing).  Stimulating your sensory nerves through electrodes on your skin and measuring the response in your spinal cord and brain (somatosensory evoked potentials). TREATMENT  Treatment for neuropathic pain may change over time. You may need to try different treatment options or a combination of treatments. Some options include:  Over-the-counter pain relievers.  Prescription medicines. Some medicines used to treat other conditions may also help neuropathic pain. These include medicines to:  Control seizures (anticonvulsants).  Relieve depression (antidepressants).  Prescription-strength pain relievers (narcotics). These are usually used when other pain relievers do not help.  Transcutaneous nerve stimulation (TENS). This uses electrical currents to block painful nerve signals. The treatment is painless.  Topical and local anesthetics. These are medicines that numb the nerves. They can be injected as a nerve  block or applied to the skin.  Alternative treatments, such as:  Acupuncture.  Meditation.  Massage.  Physical therapy.  Pain management programs.  Counseling. HOME CARE INSTRUCTIONS  Learn as much as you can about your condition.  Take medicines only as directed by your health care provider.  Work closely with all your health care providers to find what works best for  you.  Have a good support system at home.  Consider joining a chronic pain support group. SEEK MEDICAL CARE IF:  Your pain treatments are not helping.  You are having side effects from your medicines.  You are struggling with fatigue, mood changes, depression, or anxiety.   This information is not intended to replace advice given to you by your health care provider. Make sure you discuss any questions you have with your health care provider.   Document Released: 11/07/2003 Document Revised: 03/02/2014 Document Reviewed: 07/20/2013 Elsevier Interactive Patient Education 2016 [place] therapy can help ease sore, stiff, injured, and tight muscles and joints. Heat relaxes your muscles, which may help ease your pain.  RISKS AND COMPLICATIONS If you have any of the following conditions, do not use heat therapy unless your health care provider has approved:  Poor circulation.  Healing wounds or scarred skin in the area being treated.  Diabetes, heart disease, or high blood pressure.  Not being able to feel (numbness) the area being treated.  Unusual swelling of the area being treated.  Active infections.  Blood clots.  Cancer.  Inability to communicate pain. This may include young children and people who have problems with their brain function (dementia).  Pregnancy. Heat therapy should only be used on old, pre-existing, or long-lasting (chronic) injuries. Do not use heat therapy on new injuries unless directed by your health care provider. HOW TO USE HEAT THERAPY There are several different kinds of heat therapy, including:  Moist heat pack.  Warm water bath.  Hot water bottle.  Electric heating pad.  Heated gel pack.  Heated wrap.  Electric heating pad. Use the heat therapy method suggested by your health care provider. Follow your health care provider's instructions on when and how to use heat therapy. GENERAL HEAT THERAPY  RECOMMENDATIONS  Do not sleep while using heat therapy. Only use heat therapy while you are awake.  Your skin may turn pink while using heat therapy. Do not use heat therapy if your skin turns red.  Do not use heat therapy if you have new pain.  High heat or long exposure to heat can cause burns. Be careful when using heat therapy to avoid burning your skin.  Do not use heat therapy on areas of your skin that are already irritated, such as with a rash or sunburn. SEEK MEDICAL CARE IF:  You have blisters, redness, swelling, or numbness.  You have new pain.  Your pain is worse. MAKE SURE YOU:  Understand these instructions.  Will watch your condition.  Will get help right away if you are not doing well or get worse.   This information is not intended to replace advice given to you by your health care provider. Make sure you discuss any questions you have with your health care provider.   Document Released: 05/04/2011 Document Revised: 03/02/2014 Document Reviewed: 04/04/2013 Elsevier Interactive Patient Education [employer].

## 2015-06-12 ENCOUNTER — Telehealth: Payer: Self-pay

## 2015-06-12 LAB — POCT URINALYSIS DIP (DEVICE)
Bilirubin Urine: NEGATIVE
GLUCOSE, UA: NEGATIVE mg/dL
KETONES UR: NEGATIVE mg/dL
LEUKOCYTES UA: NEGATIVE
Nitrite: NEGATIVE
Protein, ur: NEGATIVE mg/dL
UROBILINOGEN UA: 0.2 mg/dL (ref 0.0–1.0)
pH: 7 (ref 5.0–8.0)

## 2015-06-12 LAB — COMPLETE METABOLIC PANEL WITH GFR
ALT: 14 U/L (ref 6–29)
AST: 25 U/L (ref 10–35)
Albumin: 3.8 g/dL (ref 3.6–5.1)
Alkaline Phosphatase: 80 U/L (ref 33–130)
BUN: 4 mg/dL — ABNORMAL LOW (ref 7–25)
CALCIUM: 8.6 mg/dL (ref 8.6–10.4)
CHLORIDE: 96 mmol/L — AB (ref 98–110)
CO2: 27 mmol/L (ref 20–31)
Creat: 0.76 mg/dL (ref 0.50–1.05)
GFR, Est Non African American: 89 mL/min (ref >=60)
Glucose, Bld: 94 mg/dL (ref 65–99)
POTASSIUM: 3.3 mmol/L — AB (ref 3.5–5.3)
Sodium: 135 mmol/L (ref 135–146)
Total Bilirubin: 0.4 mg/dL (ref 0.2–1.2)
Total Protein: 7.2 g/dL (ref 6.1–8.1)

## 2015-06-12 LAB — LIPID PANEL
CHOL/HDL RATIO: 3.3 ratio (ref <=5.0)
CHOLESTEROL: 170 mg/dL (ref 125–200)
HDL: 52 mg/dL (ref >=46)
LDL CALC: 99 mg/dL (ref <130)
TRIGLYCERIDES: 95 mg/dL (ref <150)
VLDL: 19 mg/dL (ref <30)

## 2015-06-12 LAB — C-REACTIVE PROTEIN: CRP: 1 mg/dL — ABNORMAL HIGH (ref <0.60)

## 2015-06-12 LAB — RHEUMATOID FACTOR: Rhuematoid fact SerPl-aCnc: <10 IU/mL (ref <=14)

## 2015-06-12 LAB — SEDIMENTATION RATE: Sed Rate: 8 mm/hr (ref 0–30)

## 2015-06-12 NOTE — Telephone Encounter (Signed)
-----   Message from Dorena Dew, [place] sent at 06/12/2015  8:04 AM EDT ----- Regarding: lab results Please inform patient that potassium is decreased, will re-check levels at follow up appointment in 1 month. All other labs are within a normal range.   Thanks ----- Message -----    From: Amado Coe, CMA    Sent: 06/11/2015  11:02 AM      To: Dorena Dew, FNP

## 2015-06-12 NOTE — Telephone Encounter (Signed)
Called patient. Gave lab results. Patient verbalized understanding.  

## 2015-06-19 DIAGNOSIS — I158 Other secondary hypertension: Secondary | ICD-10-CM | POA: Diagnosis not present

## 2015-06-19 DIAGNOSIS — M545 Low back pain: Secondary | ICD-10-CM | POA: Diagnosis not present

## 2015-07-08 DIAGNOSIS — H25013 Cortical age-related cataract, bilateral: Secondary | ICD-10-CM | POA: Diagnosis not present

## 2015-07-08 DIAGNOSIS — H524 Presbyopia: Secondary | ICD-10-CM | POA: Diagnosis not present

## 2015-07-08 DIAGNOSIS — H2513 Age-related nuclear cataract, bilateral: Secondary | ICD-10-CM | POA: Diagnosis not present

## 2015-07-08 DIAGNOSIS — H04123 Dry eye syndrome of bilateral lacrimal glands: Secondary | ICD-10-CM | POA: Diagnosis not present

## 2015-07-17 ENCOUNTER — Other Ambulatory Visit: Payer: Self-pay | Admitting: Internal Medicine

## 2015-07-18 MED FILL — LISINOPRIL-HCTZ 10-12.5 MG: 10-12.5 | 30 days supply | Qty: 30 | Fill #0

## 2015-07-18 MED FILL — OMEPRAZOLE DR 20 MG CAPSULE: 20 | 30 days supply | Qty: 60 | Fill #0

## 2015-07-25 ENCOUNTER — Ambulatory Visit: Payer: Medicare Other | Admitting: Family Medicine

## 2015-07-28 ENCOUNTER — Encounter (HOSPITAL_COMMUNITY): Payer: Self-pay | Admitting: Nurse Practitioner

## 2015-07-28 ENCOUNTER — Emergency Department (HOSPITAL_COMMUNITY)
Admission: EM | Admit: 2015-07-28 | Discharge: 2015-07-28 | Disposition: A | Discharge status: Home / Self Care | Payer: Medicare Other | Attending: Emergency Medicine | Admitting: Emergency Medicine

## 2015-07-28 ENCOUNTER — Ambulatory Visit (HOSPITAL_COMMUNITY)
Admission: EM | Admit: 2015-07-28 | Discharge: 2015-07-28 | Discharge status: Absent without leave | Payer: Medicare Other

## 2015-07-28 DIAGNOSIS — G8929 Other chronic pain: Secondary | ICD-10-CM | POA: Insufficient documentation

## 2015-07-28 DIAGNOSIS — I10 Essential (primary) hypertension: Secondary | ICD-10-CM | POA: Diagnosis not present

## 2015-07-28 DIAGNOSIS — Z79899 Other long term (current) drug therapy: Secondary | ICD-10-CM | POA: Insufficient documentation

## 2015-07-28 DIAGNOSIS — M25511 Pain in right shoulder: Secondary | ICD-10-CM | POA: Diagnosis present

## 2015-07-28 HISTORY — DX: Heartburn: R12

## 2015-07-28 MED ORDER — TRAMADOL HCL 50 MG PO TABS: 50.0000 mg | ORAL_TABLET | Freq: Four times a day (QID) | ORAL | Status: DC | PRN

## 2015-07-28 MED ORDER — IBUPROFEN 600 MG PO TABS: 600.0000 mg | ORAL_TABLET | Freq: Four times a day (QID) | ORAL | Status: DC | PRN

## 2015-07-28 NOTE — ED Provider Notes (Signed)
CSN: [id number]     Arrival date & time 07/28/15  1844 History  By signing my name below, I, Irene Pap, attest that this documentation has been prepared under the direction and in the presence of [employer], PA-C. Electronically Signed: Irene Pap, ED Scribe. 07/28/2015. 7:57 PM.   Chief Complaint  Patient presents with  . Back Pain   The history is provided by the patient. No language interpreter was used.  HPI Comments: Toni Gutierrez is a 55 y.o. female with a hx of HTN, chronic shoulder pain, and chronic back pain who presents to the Emergency Department complaining of gradually worsening right shoulder pain that radiates to the right neck and right upper back onset one month ago. She reports worsening pain with laying on her right side. Pt has tried OTC pain medications and Icy Hot on the area to no relief. She reports a hx of surgery to the right rotator cuff in 2007. She has used Naproxen in the past for pain to no relief. She denies new injury, fever, numbness or weakness.  Orthopedist: Dr. Ronnie Derby  Past Medical History  Diagnosis Date  . Hypertension   . Hyperlipidemia   . Shoulder pain, bilateral     MRI right shoulder showing tear of infraspinatus tendon with bursal surface fraying of the infraspinatus  . Chronic lower back pain     MRI of cervical, lumbar spine (all obtained by Dr. Ronnie Derby in 2006)- showing mild DDD with bulges but without disc herniation, at multiple levels; mild facet joint dz, primarily at L3-4 and L4-5 bilaterally  . Left knee pain     s/p ACL reconstruction by Dr. Ronnie Derby  . Carpal tunnel syndrome, right   . Chronic headache     Thought to be 2/2 cervical DJD vs anxiety. CT head in 2010 without any significant findings  . Allergic rhinitis   . Arthritis   . Alcohol abuse     No alcohol intake since 2001  . Heartburn    Past Surgical History  Procedure Laterality Date  . Tubal ligation  1989  . Anterior cruciate ligament repair      By Dr.  Ronnie Derby  . Rotator cuff repair      Right shoulder   Family History  Problem Relation Age of Onset  . Hypertension Father    Social History  Substance Use Topics  . Smoking status: Never Smoker   . Smokeless tobacco: Never Used  . Alcohol Use: No     Comment: History of abuse   OB History    No data available     Review of Systems A complete 10 system review of systems was obtained and all systems are negative except as noted in the HPI and PMH.   Allergies  Review of patient's allergies indicates no known allergies.  Home Medications   Prior to Admission medications   Medication Sig Start Date End Date Taking? Authorizing Provider  cyclobenzaprine (FLEXERIL) 10 MG tablet Take 1 tablet (10 mg total) by mouth 3 (three) times daily as needed for muscle spasms. 05/04/14   Lance Bosch, NP  DULoxetine (CYMBALTA) 30 MG capsule Take 1 capsule (30 mg total) by mouth daily. 06/11/15   Dorena Dew, FNP  gabapentin (NEURONTIN) 100 MG capsule Take 1 capsule (100 mg total) by mouth 3 (three) times daily. 06/11/15   Dorena Dew, FNP  lisinopril-hydrochlorothiazide (PRINZIDE,ZESTORETIC) 10-12.5 MG tablet TAKE 1 TABLET BY MOUTH DAILY. MUST HAVE OFFICE VISIT FOR REFILLS  07/18/15   Dorena Dew, FNP  meloxicam (MOBIC) 15 MG tablet Take 1 tablet (15 mg total) by mouth daily. 04/25/15   Janne Napoleon, NP  omeprazole (PRILOSEC) 20 MG capsule TAKE 2 CAPSULES BY MOUTH DAILY. MUST HAVE OFFICE VISIT FOR REFILLS 07/18/15   Dorena Dew, FNP   BP 149/82 mmHg  Pulse 85  Temp(Src) 98.2 F (36.8 C) (Oral)  Resp 17  SpO2 97% Physical Exam  Constitutional: She is oriented to person, place, and time. She appears well-developed and well-nourished. No distress.  HENT:  Head: Normocephalic and atraumatic.  Mouth/Throat: Oropharynx is clear and moist. No oropharyngeal exudate.  Eyes: Conjunctivae and EOM are normal. Pupils are equal, round, and reactive to light.  Neck: Normal range of motion.  Neck supple.  Cardiovascular: Normal rate and regular rhythm.   Pulmonary/Chest: Effort normal and breath sounds normal.  Musculoskeletal: Normal range of motion.  Distal sensation of right hand intact; tenderness to palpation of right shoulder, pain with ROM of right shoulder but has full ROM; 2+ radial pulses to the right hand  Neurological: She is alert and oriented to person, place, and time.  Skin: Skin is warm and dry.  Psychiatric: She has a normal mood and affect. Her behavior is normal.  Nursing note and vitals reviewed.   ED Course  Procedures (including critical care time) DIAGNOSTIC STUDIES: Oxygen Saturation is 97% on RA, normal by my interpretation.    COORDINATION OF CARE: 7:56 PM-Discussed treatment plan which includes orthopedic referral and anti-inflammatories with pt at bedside and pt agreed to plan. Pt is requesting a sling for her right arm. She was advised to not keep her arm in the sling for more than a couple of days to avoid frozen shoulder.    Labs Review Labs Reviewed - No data to display  Imaging Review No results found. I have personally reviewed and evaluated these images and lab results as part of my medical decision-making.   EKG Interpretation None      MDM   Final diagnoses:  None   Patient presents today with chronic right shoulder pain.  No acute injury or trauma.  No signs of infection.  Patient neurovascularly intact.  Stable for discharge.  Given referral back to her Orthopedist.  Return precautions given.  I personally performed the services described in this documentation, which was scribed in my presence. The recorded information has been reviewed and is accurate.    Hyman Bible, PA-C 07/29/15 0015  Hyman Bible, PA-C 07/29/15 0015  Tanna Furry, MD 08/06/15 [phone]

## 2015-07-28 NOTE — ED Notes (Signed)
Anne Ng, PA, in w/pt.

## 2015-07-28 NOTE — ED Notes (Signed)
She c/o 1 month history R shoulder pain radiating into back.. She tried OTC pain meds, icy hot with no relief of the pain. seh reprots history of surgery to this shoulder but denies any new injuries.

## 2015-07-29 ENCOUNTER — Encounter: Payer: Self-pay | Admitting: Family Medicine

## 2015-07-29 ENCOUNTER — Ambulatory Visit (INDEPENDENT_AMBULATORY_CARE_PROVIDER_SITE_OTHER): Payer: Medicare Other | Admitting: Family Medicine

## 2015-07-29 VITALS — BP 132/75 | HR 96 | Temp 98.6°F | Resp 16 | Ht 59.0 in | Wt 133.0 lb

## 2015-07-29 DIAGNOSIS — J029 Acute pharyngitis, unspecified: Secondary | ICD-10-CM

## 2015-07-29 DIAGNOSIS — R809 Proteinuria, unspecified: Secondary | ICD-10-CM | POA: Diagnosis not present

## 2015-07-29 DIAGNOSIS — M25511 Pain in right shoulder: Secondary | ICD-10-CM

## 2015-07-29 DIAGNOSIS — I10 Essential (primary) hypertension: Secondary | ICD-10-CM | POA: Insufficient documentation

## 2015-07-29 DIAGNOSIS — J309 Allergic rhinitis, unspecified: Secondary | ICD-10-CM | POA: Diagnosis not present

## 2015-07-29 MED ORDER — LISINOPRIL-HYDROCHLOROTHIAZIDE 10-12.5 MG PO TABS: ORAL_TABLET | ORAL | Status: DC

## 2015-07-29 MED ORDER — ACETAMINOPHEN-CODEINE #3 300-30 MG PO TABS: 1.0000 | ORAL_TABLET | Freq: Three times a day (TID) | ORAL | Status: DC | PRN

## 2015-07-29 MED ORDER — KETOROLAC TROMETHAMINE 30 MG/ML IJ SOLN: 30.0000 mg | Freq: Once | INTRAMUSCULAR | Status: DC

## 2015-07-29 MED ORDER — KETOROLAC TROMETHAMINE 60 MG/2ML IM SOLN
30.0000 mg | Freq: Once | INTRAMUSCULAR | Status: AC
  Administered 2015-07-29: 30 mg via INTRAMUSCULAR

## 2015-07-29 MED ORDER — CETIRIZINE HCL 10 MG PO TABS: 10.0000 mg | ORAL_TABLET | Freq: Every day | ORAL | Status: DC

## 2015-07-29 MED ORDER — MELOXICAM 15 MG PO TABS: 15.0000 mg | ORAL_TABLET | Freq: Every day | ORAL | Status: DC

## 2015-07-29 NOTE — Patient Instructions (Signed)
Do not lift anything greater than 5 pounds Warm, moist compresses interchangably with cool compresses Wear sports bra for support. Full support bra for daily use(Bali minimizer, Lilyette)Shoulder Pain The shoulder is the joint that connects your arms to your body. The bones that form the shoulder joint include the upper arm bone (humerus), the shoulder blade (scapula), and the collarbone (clavicle). The top of the humerus is shaped like a ball and fits into a rather flat socket on the scapula (glenoid cavity). A combination of muscles and strong, fibrous tissues that connect muscles to bones (tendons) support your shoulder joint and hold the ball in the socket. Small, fluid-filled sacs (bursae) are located in different areas of the joint. They act as cushions between the bones and the overlying soft tissues and help reduce friction between the gliding tendons and the bone as you move your arm. Your shoulder joint allows a wide range of motion in your arm. This range of motion allows you to do things like scratch your back or throw a ball. However, this range of motion also makes your shoulder more prone to pain from overuse and injury. Causes of shoulder pain can originate from both injury and overuse and usually can be grouped in the following four categories:  Redness, swelling, and pain (inflammation) of the tendon (tendinitis) or the bursae (bursitis).  Instability, such as a dislocation of the joint.  Inflammation of the joint (arthritis).  Broken bone (fracture). HOME CARE INSTRUCTIONS   Apply ice to the sore area.  Put ice in a plastic bag.  Place a towel between your skin and the bag.  Leave the ice on for 15-20 minutes, 3-4 times per day for the first 2 days, or as directed by your health care provider.  Stop using cold packs if they do not help with the pain.  If you have a shoulder sling or immobilizer, wear it as long as your caregiver instructs. Only remove it to shower or  bathe. Move your arm as little as possible, but keep your hand moving to prevent swelling.  Squeeze a soft ball or foam pad as much as possible to help prevent swelling.  Only take over-the-counter or prescription medicines for pain, discomfort, or fever as directed by your caregiver. SEEK MEDICAL CARE IF:   Your shoulder pain increases, or new pain develops in your arm, hand, or fingers.  Your hand or fingers become cold and numb.  Your pain is not relieved with medicines. SEEK IMMEDIATE MEDICAL CARE IF:   Your arm, hand, or fingers are numb or tingling.  Your arm, hand, or fingers are significantly swollen or turn white or blue. MAKE SURE YOU:   Understand these instructions.  Will watch your condition.  Will get help right away if you are not doing well or get worse.   This information is not intended to replace advice given to you by your health care provider. Make sure you discuss any questions you have with your health care provider.   Document Released: 11/19/2004 Document Revised: 03/02/2014 Document Reviewed: 06/04/2014 Elsevier Interactive Patient Education [employer].

## 2015-07-29 NOTE — Progress Notes (Signed)
Subjective:    Patient ID: Toni Gutierrez, female    DOB: 1960/08/01, 55 y.o.   MRN: [id number]  HPI  [name], a 55 year old female presents for a follow up of right shoulder pain.  She has primarily been using the emergency department and urgent care for all primary care needs. [name] is complaining of chronic shoulder pain. She says that she had a right rotator cuff repair in 2007. 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 She denies shoulder injury or direct trauma. She has been taking OTC Tylenol and Ibuprofen with minimal relief. She was evaluated in the emergency department on 07/28/2015. She was prescribed Tramadol and Ibuprofen. She says that she did not have Tramadol filled, she was prescribed medication several years ago with unsatisfactory results. She was also prescribed meloxicam on last visit. She says that she only took medication once because it made her sleepy. She denies fever, nausea, fatigue. However, she endorses numbness and tingling to upper extremities. Her currnet pain intensity is 6/10 prim described as aching and intermittent. Pain is worsened by increased activity. Pain is minimally decreased by rest.    [name] also has a history of hypertension. Hypertension is controlled on Lisinopril-hydrochlorothiazide. She is taking medications consistently. She has not been exercising or following a low fat diet. She also does not check blood pressures at home.  Patient denies chest pain, claudication, dyspnea, fatigue, irregular heart beat, lower extremity edema, orthopnea and palpitations.  Cardiovascular risk factors include: dyslipidemia and sedentary lifestyle.   Past Medical History  Diagnosis Date  . Hypertension   . Hyperlipidemia   . Shoulder pain, bilateral     MRI right shoulder showing tear of infraspinatus tendon with bursal surface fraying of the infraspinatus  . Chronic lower back pain    MRI of cervical, lumbar spine (all obtained by Dr. Ronnie Derby in 2006)- showing mild DDD with bulges but without disc herniation, at multiple levels; mild facet joint dz, primarily at L3-4 and L4-5 bilaterally  . Left knee pain     s/p ACL reconstruction by Dr. Ronnie Derby  . Carpal tunnel syndrome, right   . Chronic headache     Thought to be 2/2 cervical DJD vs anxiety. CT head in 2010 without any significant findings  . Allergic rhinitis   . Arthritis   . Alcohol abuse     No alcohol intake since 2001  . Heartburn    Immunization History  Administered Date(s) Administered  . Influenza Split 03/17/2012, 11/17/2012  . Influenza Whole 12/23/2005, 12/23/2006, 11/21/2008  . Tdap 03/27/2010     Review of Systems  Constitutional: Negative for fever and fatigue.  Eyes: Negative.  Negative for photophobia and visual disturbance.  Respiratory: Negative.   Cardiovascular: Negative.   Gastrointestinal: Negative.   Endocrine: Negative.  Negative for polydipsia, polyphagia and polyuria.  Musculoskeletal: Positive for myalgias (Right shoulder pain), back pain, arthralgias and neck pain.  Skin: Negative.   Allergic/Immunologic: Negative.   Neurological: Negative.  Negative for dizziness.  Hematological: Negative.   Psychiatric/Behavioral: Negative.        Objective:   Physical Exam  Constitutional: She is oriented to person, place, and time. She appears well-developed and well-nourished.  HENT:  Head: Normocephalic and atraumatic.  Right Ear: External ear normal.  Left Ear: External ear normal.  Mouth/Throat: Oropharynx is clear and moist.  Eyes: Conjunctivae and EOM are normal. Pupils are equal, round, and  reactive to light.  Neck: Normal range of motion. Neck supple.  Cardiovascular: Normal rate, regular rhythm, normal heart sounds and intact distal pulses.   Pulmonary/Chest: Effort normal and breath sounds normal.  Abdominal: Soft. Bowel sounds are normal.  Musculoskeletal:       Right  shoulder: She exhibits decreased range of motion, pain and decreased strength (Grade 2 muscle strength). She exhibits no spasm.  Neurological: She is alert and oriented to person, place, and time.  Skin: Skin is warm and dry.  Psychiatric: She has a normal mood and affect. Her behavior is normal. Judgment and thought content normal.      BP 132/75 mmHg  Pulse 96  Temp(Src) 98.6 F (37 C) (Oral)  Resp 16  Ht 4\' 11"  (1.499 m)  Wt 133 lb (60.328 kg)  BMI 26.85 kg/m2  SpO2 98%  LMP 12/25/2010 Assessment & Plan:W  1. Pain in joint of right shoulder Previously referred to orthopedic physician for further evaluation.  - meloxicam (MOBIC) 15 MG tablet; Take 1 tablet (15 mg total) by mouth daily.  Dispense: 30 tablet; Refill: 1 - acetaminophen-codeine (TYLENOL #3) 300-30 MG tablet; Take 1 tablet by mouth every 8 (eight) hours as needed for moderate pain.  Dispense: 20 tablet; Refill: 0 - ketorolac (TORADOL) injection 30 mg; Inject 1 mL (30 mg total) into the muscle once.  2. Essential hypertension Blood pressure is at goal on current medication regimen.  - lisinopril-hydrochlorothiazide (PRINZIDE,ZESTORETIC) 10-12.5 MG tablet; TAKE 1 TABLET BY MOUTH DAILY  Dispense: 30 tablet; Refill: 5  3. Sore throat - cetirizine (ZYRTEC) 10 MG tablet; Take 1 tablet (10 mg total) by mouth daily.  Dispense: 30 tablet; Refill: 11  4. Allergic rhinitis, unspecified allergic rhinitis type - cetirizine (ZYRTEC) 10 MG tablet; Take 1 tablet (10 mg total) by mouth daily.  Dispense: 30 tablet; Refill: 11   RTC: 3 months for hypertension Nancey Kreitz M, FNP

## 2015-08-14 DIAGNOSIS — M545 Low back pain: Secondary | ICD-10-CM | POA: Diagnosis not present

## 2015-08-14 DIAGNOSIS — I158 Other secondary hypertension: Secondary | ICD-10-CM | POA: Diagnosis not present

## 2015-08-19 ENCOUNTER — Other Ambulatory Visit: Payer: Self-pay

## 2015-08-19 DIAGNOSIS — G629 Polyneuropathy, unspecified: Secondary | ICD-10-CM

## 2015-08-19 MED ORDER — GABAPENTIN 100 MG PO CAPS: 100.0000 mg | ORAL_CAPSULE | Freq: Three times a day (TID) | ORAL | Status: DC

## 2015-08-19 NOTE — Telephone Encounter (Signed)
Refill for gabapentin sent into mail order pharmacy. Thanks!

## 2015-08-26 ENCOUNTER — Other Ambulatory Visit: Payer: Self-pay | Admitting: Family Medicine

## 2015-09-13 DIAGNOSIS — E785 Hyperlipidemia, unspecified: Secondary | ICD-10-CM | POA: Diagnosis not present

## 2015-09-13 DIAGNOSIS — M545 Low back pain: Secondary | ICD-10-CM | POA: Diagnosis not present

## 2015-09-13 DIAGNOSIS — I158 Other secondary hypertension: Secondary | ICD-10-CM | POA: Diagnosis not present

## 2015-09-13 DIAGNOSIS — G44221 Chronic tension-type headache, intractable: Secondary | ICD-10-CM | POA: Diagnosis not present

## 2015-09-27 ENCOUNTER — Other Ambulatory Visit: Payer: Self-pay | Admitting: Internal Medicine

## 2015-10-08 ENCOUNTER — Telehealth: Payer: Self-pay

## 2015-10-08 NOTE — Telephone Encounter (Signed)
Refill request for tramadol LOV 07/29/2015 with [place]. Patient has an appointment scheduled in 10/2015. Please advise. Thanks!

## 2015-10-09 ENCOUNTER — Other Ambulatory Visit: Payer: Self-pay

## 2015-10-09 DIAGNOSIS — J029 Acute pharyngitis, unspecified: Secondary | ICD-10-CM

## 2015-10-09 DIAGNOSIS — J309 Allergic rhinitis, unspecified: Secondary | ICD-10-CM

## 2015-10-09 MED ORDER — CETIRIZINE HCL 10 MG PO TABS
10.0000 mg | ORAL_TABLET | Freq: Every day | ORAL | 11 refills | Status: DC
Start: 2015-10-09 — End: 2018-03-04

## 2015-10-09 NOTE — Telephone Encounter (Signed)
Refill request for zyrtec sent into pharmacy. Thanks!

## 2015-10-11 DIAGNOSIS — I158 Other secondary hypertension: Secondary | ICD-10-CM | POA: Diagnosis not present

## 2015-10-11 DIAGNOSIS — M545 Low back pain: Secondary | ICD-10-CM | POA: Diagnosis not present

## 2015-10-24 ENCOUNTER — Other Ambulatory Visit: Payer: Self-pay | Admitting: Pharmacist

## 2015-10-24 MED ORDER — OMEPRAZOLE 20 MG PO CPDR: DELAYED_RELEASE_CAPSULE | ORAL | 0 refills | Status: DC

## 2015-10-24 MED FILL — OMEPRAZOLE DR 20 MG CAPSULE: 20 | 30 days supply | Qty: 60 | Fill #0

## 2015-10-29 ENCOUNTER — Ambulatory Visit (INDEPENDENT_AMBULATORY_CARE_PROVIDER_SITE_OTHER): Payer: Medicare Other | Admitting: Family Medicine

## 2015-10-29 ENCOUNTER — Ambulatory Visit: Payer: Medicare Other | Admitting: Family Medicine

## 2015-10-29 ENCOUNTER — Encounter: Payer: Self-pay | Admitting: Family Medicine

## 2015-10-29 VITALS — BP 123/83 | HR 88 | Temp 98.5°F | Resp 16 | Ht 59.0 in | Wt 131.0 lb

## 2015-10-29 DIAGNOSIS — Z1239 Encounter for other screening for malignant neoplasm of breast: Secondary | ICD-10-CM

## 2015-10-29 DIAGNOSIS — I10 Essential (primary) hypertension: Secondary | ICD-10-CM

## 2015-10-29 DIAGNOSIS — Z1211 Encounter for screening for malignant neoplasm of colon: Secondary | ICD-10-CM | POA: Diagnosis not present

## 2015-10-29 DIAGNOSIS — M25511 Pain in right shoulder: Secondary | ICD-10-CM | POA: Diagnosis not present

## 2015-10-29 LAB — CBC WITH DIFFERENTIAL/PLATELET
BASOS PCT: 1 %
Basophils Absolute: 40 cells/uL (ref 0–200)
Eosinophils Absolute: 80 cells/uL (ref 15–500)
Eosinophils Relative: 2 %
HEMATOCRIT: 32.8 % — AB (ref 35.0–45.0)
Hemoglobin: 10.9 g/dL — ABNORMAL LOW (ref 11.7–15.5)
LYMPHS PCT: 30 %
Lymphs Abs: 1200 cells/uL (ref 850–3900)
MCH: 31.7 pg (ref 27.0–33.0)
MCHC: 33.2 g/dL (ref 32.0–36.0)
MCV: 95.3 fL (ref 80.0–100.0)
MONO ABS: 440 {cells}/uL (ref 200–950)
MONOS PCT: 11 %
MPV: 8.7 fL (ref 7.5–12.5)
NEUTROS PCT: 56 %
Neutro Abs: 2240 cells/uL (ref 1500–7800)
PLATELETS: 257 10*3/uL (ref 140–400)
RBC: 3.44 MIL/uL — AB (ref 3.80–5.10)
RDW: 18.4 % — AB (ref 11.0–15.0)
WBC: 4 10*3/uL (ref 3.8–10.8)

## 2015-10-29 LAB — COMPLETE METABOLIC PANEL WITH GFR
ALT: 16 U/L (ref 6–29)
AST: 40 U/L — AB (ref 10–35)
Albumin: 3.4 g/dL — ABNORMAL LOW (ref 3.6–5.1)
Alkaline Phosphatase: 71 U/L (ref 33–130)
BUN: 5 mg/dL — ABNORMAL LOW (ref 7–25)
CALCIUM: 8.8 mg/dL (ref 8.6–10.4)
CHLORIDE: 97 mmol/L — AB (ref 98–110)
CO2: 22 mmol/L (ref 20–31)
Creat: 0.8 mg/dL (ref 0.50–1.05)
GFR, Est Non African American: 83 mL/min (ref >=60)
Glucose, Bld: 90 mg/dL (ref 65–99)
POTASSIUM: 3.6 mmol/L (ref 3.5–5.3)
Sodium: 135 mmol/L (ref 135–146)
Total Bilirubin: 0.4 mg/dL (ref 0.2–1.2)
Total Protein: 6.4 g/dL (ref 6.1–8.1)

## 2015-10-29 MED ORDER — ACETAMINOPHEN-CODEINE #3 300-30 MG PO TABS: 1.0000 | ORAL_TABLET | Freq: Three times a day (TID) | ORAL | 0 refills | Status: DC | PRN

## 2015-10-29 NOTE — Patient Instructions (Signed)
Make appt for PAP in near future.  Make appointment for mammogram

## 2015-10-29 NOTE — Progress Notes (Signed)
Toni Gutierrez, is a 55 y.o. female  [id number]  [id number]  DOB - [birth year]  CC:  Chief Complaint  Patient presents with  . Hypertension  . Back Pain       HPI: Toni Gutierrez is a 55 y.o. female here for follow-up hypertension and right shoulder and back pain. She was seen last my [name] about 3 months ago. She denies needing refills except for requesting refill of Tylenol #3 for pain. She reports she plans to make an appointment with orthopedist about this pain. She has no other complaints today.    Health Maintenance:  She is in need of PAP and mammogram. She has had a mammogram ordered in the past but has not followed up. She has been advised to return for PAP smear. She also needs a flu shot when available.  No Known Allergies Past Medical History:  Diagnosis Date  . Alcohol abuse    No alcohol intake since 2001  . Allergic rhinitis   . Arthritis   . Carpal tunnel syndrome, right   . Chronic headache    Thought to be 2/2 cervical DJD vs anxiety. CT head in 2010 without any significant findings  . Chronic lower back pain    MRI of cervical, lumbar spine (all obtained by Dr. Ronnie Derby in 2006)- showing mild DDD with bulges but without disc herniation, at multiple levels; mild facet joint dz, primarily at L3-4 and L4-5 bilaterally  . Heartburn   . Hyperlipidemia   . Hypertension   . Left knee pain    s/p ACL reconstruction by Dr. Ronnie Derby  . Shoulder pain, bilateral    MRI right shoulder showing tear of infraspinatus tendon with bursal surface fraying of the infraspinatus   Current Outpatient Prescriptions on File Prior to Visit  Medication Sig Dispense Refill  . cetirizine (ZYRTEC) 10 MG tablet Take 1 tablet (10 mg total) by mouth daily. 30 tablet 11  . gabapentin (NEURONTIN) 100 MG capsule Take 1 capsule by mouth 3  times daily 90 capsule 3  . lisinopril-hydrochlorothiazide (PRINZIDE,ZESTORETIC) 10-12.5 MG tablet TAKE 1 TABLET BY MOUTH DAILY 30 tablet 5  .  omeprazole (PRILOSEC) 20 MG capsule TAKE 2 CAPSULES BY MOUTH DAILY. 60 capsule 0  . meloxicam (MOBIC) 15 MG tablet Take 1 tablet (15 mg total) by mouth daily. (Patient not taking: Reported on 10/29/2015) 30 tablet 1   No current facility-administered medications on file prior to visit.    Family History  Problem Relation Age of Onset  . Hypertension Father    Social History   Social History  . Marital status: Married    Spouse name: N/A  . Number of children: N/A  . Years of education: N/A   Occupational History  . Not on file.   Social History Main Topics  . Smoking status: Never Smoker  . Smokeless tobacco: Never Used  . Alcohol use No     Comment: History of abuse  . Drug use: No  . Sexual activity: Yes    Partners: Male   Other Topics Concern  . Not on file   Social History Narrative  . No narrative on file    Review of Systems: Constitutional: Negative Skin: Negative HENT: Negative  Eyes: Negative  Neck: Negative Respiratory: Negative Cardiovascular: Negative Gastrointestinal: Negative Genitourinary: Negative  Musculoskeletal: Positive for lower back and right shoulder pain (chronic) Neurological: Negative for Hematological: Negative  Psychiatric/Behavioral: Negative    Objective:   Vitals:   10/29/15 0841  BP:  123/83  Pulse: 88  Resp: 16  Temp: 98.5 F (36.9 C)    Physical Exam: Constitutional: Patient appears well-developed and well-nourished. No distress. HENT: Normocephalic, atraumatic, External right and left ear normal. Oropharynx is clear and moist.  Eyes: Conjunctivae and EOM are normal. PERRLA, no scleral icterus. Neck: Normal ROM. Neck supple. No lymphadenopathy, No thyromegaly. CVS: RRR, S1/S2 +, no murmurs, no gallops, no rubs Pulmonary: Effort and breath sounds normal, no stridor, rhonchi, wheezes, rales.  Abdominal: Soft. Normoactive BS,, no distension, tenderness, rebound or guarding.  Musculoskeletal: Decreased ROM of right  shoulder, tenderness over the lower back Neuro: Alert.Normal muscle tone coordination. Non-focal Skin: Skin is warm and dry. No rash noted. Not diaphoretic. No erythema. No pallor. Psychiatric: Normal mood and affect. Behavior, judgment, thought content normal.  Lab Results  Component Value Date   WBC 6.0 11/17/2012   HGB 13.4 11/17/2012   HCT 39.2 11/17/2012   MCV 87.9 11/17/2012   PLT 323 11/17/2012   Lab Results  Component Value Date   CREATININE 0.76 06/11/2015   BUN 4 (L) 06/11/2015   NA 135 06/11/2015   K 3.3 (L) 06/11/2015   CL 96 (L) 06/11/2015   CO2 27 06/11/2015    Lab Results  Component Value Date   HGBA1C 5.0 08/12/2012   Lipid Panel     Component Value Date/Time   CHOL 170 06/11/2015 1049   TRIG 95 06/11/2015 1049   HDL 52 06/11/2015 1049   CHOLHDL 3.3 06/11/2015 1049   VLDL 19 06/11/2015 1049   LDLCALC 99 06/11/2015 1049       Assessment and plan:   1. Pain in joint of right shoulder  - acetaminophen-codeine (TYLENOL #3) 300-30 MG tablet; Take 1 tablet by mouth every 8 (eight) hours as needed for moderate pain.  Dispense: 20 tablet; Refill: 0  2. Essential hypertension  - CBC with Differential - COMPLETE METABOLIC PANEL WITH GFR  3. Colon cancer screening  - Ambulatory referral to Gastroenterology  4. Breast cancer screening -She is to call and make appointment for mammogram  5. Need for PAP -Is to make an appointment for PAP in near future.   No Follow-up on file.  The patient was given clear instructions to go to ER or return to medical center if symptoms don't improve, worsen or new problems develop. The patient verbalized understanding.    Micheline Chapman FNP  10/29/2015, 9:11 AM

## 2015-11-07 DIAGNOSIS — M545 Low back pain: Secondary | ICD-10-CM | POA: Diagnosis not present

## 2015-11-07 DIAGNOSIS — G44221 Chronic tension-type headache, intractable: Secondary | ICD-10-CM | POA: Diagnosis not present

## 2015-11-07 DIAGNOSIS — I158 Other secondary hypertension: Secondary | ICD-10-CM | POA: Diagnosis not present

## 2015-11-07 DIAGNOSIS — E785 Hyperlipidemia, unspecified: Secondary | ICD-10-CM | POA: Diagnosis not present

## 2015-11-20 DIAGNOSIS — D164 Benign neoplasm of bones of skull and face: Secondary | ICD-10-CM | POA: Diagnosis not present

## 2015-11-27 ENCOUNTER — Ambulatory Visit: Payer: Medicare Other | Admitting: Family Medicine

## 2015-11-29 ENCOUNTER — Encounter: Payer: Self-pay | Admitting: Family Medicine

## 2015-12-06 DIAGNOSIS — M545 Low back pain: Secondary | ICD-10-CM | POA: Diagnosis not present

## 2015-12-06 DIAGNOSIS — I158 Other secondary hypertension: Secondary | ICD-10-CM | POA: Diagnosis not present

## 2015-12-06 DIAGNOSIS — G44221 Chronic tension-type headache, intractable: Secondary | ICD-10-CM | POA: Diagnosis not present

## 2015-12-06 DIAGNOSIS — E785 Hyperlipidemia, unspecified: Secondary | ICD-10-CM | POA: Diagnosis not present

## 2016-01-06 DIAGNOSIS — I158 Other secondary hypertension: Secondary | ICD-10-CM | POA: Diagnosis not present

## 2016-01-06 DIAGNOSIS — M545 Low back pain: Secondary | ICD-10-CM | POA: Diagnosis not present

## 2016-01-28 ENCOUNTER — Ambulatory Visit (INDEPENDENT_AMBULATORY_CARE_PROVIDER_SITE_OTHER): Payer: Medicare Other | Admitting: Family Medicine

## 2016-01-28 ENCOUNTER — Encounter: Payer: Self-pay | Admitting: Family Medicine

## 2016-01-28 VITALS — BP 117/85 | HR 85 | Temp 98.0°F | Resp 16 | Ht 59.0 in | Wt 127.0 lb

## 2016-01-28 DIAGNOSIS — M25512 Pain in left shoulder: Secondary | ICD-10-CM

## 2016-01-28 DIAGNOSIS — G8929 Other chronic pain: Secondary | ICD-10-CM

## 2016-01-28 DIAGNOSIS — R29898 Other symptoms and signs involving the musculoskeletal system: Secondary | ICD-10-CM

## 2016-01-28 DIAGNOSIS — Z23 Encounter for immunization: Secondary | ICD-10-CM | POA: Diagnosis not present

## 2016-01-28 DIAGNOSIS — I10 Essential (primary) hypertension: Secondary | ICD-10-CM | POA: Diagnosis not present

## 2016-01-28 DIAGNOSIS — K219 Gastro-esophageal reflux disease without esophagitis: Secondary | ICD-10-CM

## 2016-01-28 DIAGNOSIS — M25511 Pain in right shoulder: Secondary | ICD-10-CM | POA: Diagnosis not present

## 2016-01-28 LAB — COMPLETE METABOLIC PANEL WITH GFR
ALT: 22 U/L (ref 6–29)
AST: 55 U/L — AB (ref 10–35)
Albumin: 3.6 g/dL (ref 3.6–5.1)
Alkaline Phosphatase: 74 U/L (ref 33–130)
BUN: 3 mg/dL — AB (ref 7–25)
CALCIUM: 9 mg/dL (ref 8.6–10.4)
CHLORIDE: 98 mmol/L (ref 98–110)
CO2: 25 mmol/L (ref 20–31)
CREATININE: 0.77 mg/dL (ref 0.50–1.05)
GFR, Est African American: >89 mL/min (ref >=60)
GFR, Est Non African American: 87 mL/min (ref >=60)
GLUCOSE: 85 mg/dL (ref 65–99)
POTASSIUM: 3.7 mmol/L (ref 3.5–5.3)
SODIUM: 135 mmol/L (ref 135–146)
Total Bilirubin: 0.3 mg/dL (ref 0.2–1.2)
Total Protein: 7 g/dL (ref 6.1–8.1)

## 2016-01-28 LAB — POCT URINALYSIS DIP (DEVICE)
Bilirubin Urine: NEGATIVE
GLUCOSE, UA: NEGATIVE mg/dL
HGB URINE DIPSTICK: NEGATIVE
KETONES UR: NEGATIVE mg/dL
Leukocytes, UA: NEGATIVE
Nitrite: NEGATIVE
PH: 6.5 (ref 5.0–8.0)
PROTEIN: NEGATIVE mg/dL
SPECIFIC GRAVITY, URINE: 1.01 (ref 1.005–1.030)
UROBILINOGEN UA: 0.2 mg/dL (ref 0.0–1.0)

## 2016-01-28 MED ORDER — OMEPRAZOLE 20 MG PO CPDR: DELAYED_RELEASE_CAPSULE | ORAL | 0 refills | Status: DC

## 2016-01-28 MED ORDER — LISINOPRIL-HYDROCHLOROTHIAZIDE 10-12.5 MG PO TABS: ORAL_TABLET | ORAL | 5 refills | Status: DC

## 2016-01-28 MED ORDER — ACETAMINOPHEN-CODEINE #3 300-30 MG PO TABS: 1.0000 | ORAL_TABLET | Freq: Three times a day (TID) | ORAL | 0 refills | Status: DC | PRN

## 2016-01-28 MED ORDER — MELOXICAM 15 MG PO TABS: 15.0000 mg | ORAL_TABLET | Freq: Every day | ORAL | 0 refills | Status: DC

## 2016-01-28 NOTE — Patient Instructions (Addendum)
Dr. Baldemar Lenis: [facility] Will notify with abnormal laboratory results Continue lisinopril-hydrochlorothiazide for hypertension. The patient is asked to make an attempt to improve diet and exercise patterns to aid in medical management of this problem. Will send referral to orthopedic physician.  Will return in 3 months for hypertension and pap smear Chronic Pain, Adult Chronic pain is a type of pain that lasts or keeps coming back (recurs) for at least six months. You may have chronic headaches, abdominal pain, or body pain. Chronic pain may be related to an illness, such as fibromyalgia or complex regional pain syndrome. Sometimes the cause of chronic pain is not known. Chronic pain can make it hard for you to do daily activities. If not treated, chronic pain can lead to other health problems, including anxiety and depression. Treatment depends on the cause and severity of your pain. You may need to work with a pain specialist to come up with a treatment plan. The plan may include medicine, counseling, and physical therapy. Many people benefit from a combination of two or more types of treatment to control their pain. Follow these instructions at home: Lifestyle  Consider keeping a pain diary to share with your health care providers.  Consider talking with a mental health care provider (psychologist) about how to cope with chronic pain.  Consider joining a chronic pain support group.  Try to control or lower your stress levels. Talk to your health care provider about strategies to do this. General instructions  Take over-the-counter and prescription medicines only as told by your health care provider.  Follow your treatment plan as told by your health care provider. This may include:  Gentle, regular exercise.  Eating a healthy diet that includes foods such as vegetables, fruits, fish, and lean meats.  Cognitive or behavioral therapy.  Working with a Therapist, music.  Meditation or yoga.  Acupuncture or massage therapy.  Aroma, color, light, or sound therapy.  Local electrical stimulation.  Shots (injections) of numbing or pain-relieving medicines into the spine or the area of pain.  Check your pain level as told by your health care provider. Ask your health care provider if you should use a pain scale.  Learn as much as you can about how to manage your chronic pain. Ask your health care provider if an intensive pain rehabilitation program or a chronic pain specialist would be helpful.  Keep all follow-up visits as told by your health care provider. This is important. Contact a health care provider if:  Your pain gets worse.  You have new pain.  You have trouble sleeping.  You have trouble doing your normal activities.  Your pain is not controlled with treatment.  Your have side effects from pain medicine.  You feel weak. Get help right away if:  You lose feeling or have numbness in your body.  You lose control of bowel or bladder function.  Your pain suddenly gets much worse.  You develop shaking or chills.  You develop confusion.  You develop chest pain.  You have trouble breathing or shortness of breath.  You pass out.  You have thoughts about hurting yourself or others. This information is not intended to replace advice given to you by your health care provider. Make sure you discuss any questions you have with your health care provider. Document Released: 11/01/2001 Document Revised: 10/10/2015 Document Reviewed: 07/30/2015 Elsevier Interactive Patient Education  2017 [employer] [facility] Introduction Joint pain can be caused by many things.  The joint can be bruised, infected, weak from aging, or sore from exercise. The pain will probably go away if you follow your doctor's instructions for home care. If your joint pain continues, more tests may be needed to help find the cause of your  condition. Follow these instructions at home: Watch your condition for any changes. Follow these instructions as told to lessen the pain that you are feeling:  Take medicines only as told by your doctor.  Rest the sore joint for as long as told by your doctor. If your doctor tells you to, raise (elevate) the painful joint above the level of your heart while you are sitting or lying down.  Do not do things that cause pain or make the pain worse.  If told, put ice on the painful area:  Put ice in a plastic bag.  Place a towel between your skin and the bag.  Leave the ice on for 20 minutes, 2-3 times per day.  Wear an elastic bandage, splint, or sling as told by your doctor. Loosen the bandage or splint if your fingers or toes lose feeling (become numb) and tingle, or if they turn cold and blue.  Begin exercising or stretching the joint as told by your doctor. Ask your doctor what types of exercise are safe for you.  Keep all follow-up visits as told by your doctor. This is important. Contact a doctor if:  Your pain gets worse and medicine does not help it.  Your joint pain does not get better in 3 days.  You have more bruising or swelling.  You have a fever.  You lose 10 pounds (4.5 kg) or more without trying. Get help right away if:  You are not able to move the joint.  Your fingers or toes become numb or they turn cold and blue. This information is not intended to replace advice given to you by your health care provider. Make sure you discuss any questions you have with your health care provider. Document Released: 01/28/2009 Document Revised: 07/18/2015 Document Reviewed: 11/21/2013  2017 Elsevier  Managing Your Hypertension Hypertension is commonly called high blood pressure. Blood pressure is a measurement of how strongly your blood is pressing against the walls of your arteries. Arteries are blood vessels that carry blood from your heart throughout your body. Blood  pressure does not stay the same. It rises when you are active, excited, or nervous. It lowers when you are sleeping or relaxed. If the numbers that measure your blood pressure stay above normal most of the time, you are at risk for health problems. Hypertension is a long-term (chronic) condition in which blood pressure is elevated. This condition often has no signs or symptoms. The cause of the condition is usually not known. What are blood pressure readings? A blood pressure reading is recorded as two numbers, such as "120 over 80" (or 120/80). The first ("top") number is called the systolic pressure. It is a measure of the pressure in your arteries as the heart beats. The second ("bottom") number is called the diastolic pressure. It is a measure of the pressure in your arteries as the heart relaxes between beats. What does my blood pressure reading mean? Blood pressure is classified into four stages. Based on your blood pressure reading, your health care provider may use the following stages to determine what type of treatment, if any, is needed. Systolic pressure and diastolic pressure are measured in a unit called mm Hg. Normal  Systolic pressure:  below 120.  Diastolic pressure: below 80. Prehypertension  Systolic pressure: 123456.  Diastolic pressure: [id number]. Hypertension stage 1  Systolic pressure: A999333.  Diastolic pressure: A999333. Hypertension stage 2  Systolic pressure: 0000000 or above.  Diastolic pressure: 123XX123 or above. What health risks are associated with hypertension? Managing your hypertension is an important responsibility. Uncontrolled hypertension can lead to:  A heart attack.  A stroke.  A weakened blood vessel (aneurysm).  Heart failure.  Kidney damage.  Eye damage.  Metabolic syndrome.  Memory and concentration problems. What changes can I make to manage my hypertension? Hypertension can be managed effectively by making lifestyle changes and possibly by  taking medicines. Your health care provider will help you come up with a plan to bring your blood pressure within a normal range. Your plan should include the following: Monitoring  Monitor your blood pressure at home as told by your health care provider. Your personal target blood pressure may vary depending on your medical conditions, your age, and other factors.  Have your blood pressure rechecked as told by your health care provider. Lifestyle  Lose weight if necessary.  Get at least 30-45 minutes of aerobic exercise at least 4 times a week.  Do not use any products that contain nicotine or tobacco, such as cigarettes and e-cigarettes. If you need help quitting, ask your health care provider.  Learn ways to reduce stress.  Control any chronic conditions, such as high cholesterol or diabetes. Eating and drinking  Follow the DASH diet. This diet is high in fruits, vegetables, and whole grains. It is low in salt, red meat, and added sugars.  Keep your sodium intake below 2,300 mg per day.  Limit alcoholic beverages. Communication  Review all the medicines you take with your health care provider because there may be side effects or interactions.  Talk with your health care provider about your diet, exercise habits, and other lifestyle factors that may be contributing to hypertension.  See your health care provider regularly. Your health care provider can help you create and adjust your plan for managing hypertension. Will I need medicine to control my blood pressure? Your health care provider may prescribe medicine if lifestyle changes are not enough to get your blood pressure under control, and if one of the following is true:  You are 61-2 years of age, and your systolic blood pressure is 140 or higher.  You are 50 years of age or older, and your systolic blood pressure is 150 or higher.  Your diastolic blood pressure is 90 or higher.  You have diabetes, and your systolic  blood pressure is over [id number] or your diastolic blood pressure is over 90.  You have kidney disease, and your blood pressure is above 140/90.  You have heart disease or a history of stroke, and your blood pressure is 140/90 or higher. Take medicines only as told by your health care provider. Follow the directions carefully. Blood pressure medicines must be taken as prescribed. The medicine does not work as well when you skip doses. Skipping doses also puts you at risk for problems. Contact a health care provider if:  You think you are having a reaction to medicines you have taken.  You have repeated (recurrent) headaches.  You feel dizzy.  You have swelling in your ankles.  You have trouble with your vision. Get help right away if:  You develop a severe headache or confusion.  You have unusual weakness or numbness, or you feel faint.  You  have severe pain in your chest or abdomen.  You vomit repeatedly.  You have trouble breathing. This information is not intended to replace advice given to you by your health care provider. Make sure you discuss any questions you have with your health care provider. Document Released: 11/04/2011 Document Revised: 10/15/2015 Document Reviewed: 05/10/2015 Elsevier Interactive Patient Education  2017 [employer].

## 2016-01-28 NOTE — Progress Notes (Signed)
Subjective:    Patient ID: Toni Gutierrez, female    DOB: 1960-12-26, 55 y.o.   MRN: [id number]  Back Pain  Pertinent negatives include no fever.    [name], a 55 year old female presents for a follow up hypertension and bilateral shoulder pain. [name] is complaining of chronic shoulder pain. She says that she had a right rotator cuff repair in 2007. 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 She denies shoulder injury or direct trauma. She has been taking Tylenol #3 and Meloxicam with moderate relief. She denies fever, nausea, fatigue. However, she endorses numbness and tingling to upper extremities. Her currnet pain intensity is 6/10 prim described as aching and intermittent.   Mhs. Toni Gutierrez also has a history of hypertension. Hypertension is controlled on Lisinopril-hydrochlorothiazide. She is taking medications consistently. She has not been exercising or following a low fat diet. She also does not check blood pressures at home.  Patient denies chest pain, claudication, dyspnea, fatigue, irregular heart beat, lower extremity edema, orthopnea and palpitations.  Cardiovascular risk factors include: dyslipidemia and sedentary lifestyle.   Past Medical History:  Diagnosis Date  . Alcohol abuse    No alcohol intake since 2001  . Allergic rhinitis   . Arthritis   . Carpal tunnel syndrome, right   . Chronic headache    Thought to be 2/2 cervical DJD vs anxiety. CT head in 2010 without any significant findings  . Chronic lower back pain    MRI of cervical, lumbar spine (all obtained by Dr. Ronnie Derby in 2006)- showing mild DDD with bulges but without disc herniation, at multiple levels; mild facet joint dz, primarily at L3-4 and L4-5 bilaterally  . Heartburn   . Hyperlipidemia   . Hypertension   . Left knee pain    s/p ACL reconstruction by Dr. Ronnie Derby  . Shoulder pain, bilateral    MRI right shoulder showing tear of  infraspinatus tendon with bursal surface fraying of the infraspinatus   Immunization History  Administered Date(s) Administered  . Influenza Split 03/17/2012, 11/17/2012  . Influenza Whole 12/23/2005, 12/23/2006, 11/21/2008  . Tdap 03/27/2010     Review of Systems  Constitutional: Negative for fatigue and fever.  Eyes: Negative.  Negative for photophobia and visual disturbance.  Respiratory: Negative.   Cardiovascular: Negative.   Gastrointestinal: Negative.   Endocrine: Negative.  Negative for polydipsia, polyphagia and polyuria.  Musculoskeletal: Positive for arthralgias, back pain, myalgias (Bilateral shoulder pain) and neck pain.  Skin: Negative.   Allergic/Immunologic: Negative.   Neurological: Negative.  Negative for dizziness.  Hematological: Negative.   Psychiatric/Behavioral: Negative.        Objective:   Physical Exam  Constitutional: She is oriented to person, place, and time. She appears well-developed and well-nourished.  HENT:  Head: Normocephalic and atraumatic.  Right Ear: External ear normal.  Left Ear: External ear normal.  Mouth/Throat: Oropharynx is clear and moist.  Eyes: Conjunctivae and EOM are normal. Pupils are equal, round, and reactive to light.  Neck: Normal range of motion. Neck supple.  Cardiovascular: Normal rate, regular rhythm, normal heart sounds and intact distal pulses.   Pulmonary/Chest: Effort normal and breath sounds normal.  Abdominal: Soft. Bowel sounds are normal.  Musculoskeletal:       Right shoulder: She exhibits decreased range of motion, pain and decreased strength (Grade 2 muscle strength). She exhibits no spasm.  Neurological: She is alert and oriented to person, place,  and time.  Skin: Skin is warm and dry.  Psychiatric: She has a normal mood and affect. Her behavior is normal. Judgment and thought content normal.      BP 117/85 (BP Location: Right Arm, Patient Position: Sitting, Cuff Size: Normal)   Pulse 85   Temp 98  F (36.7 C) (Oral)   Resp 16   Ht 4\' 11"  (1.499 m)   Wt 127 lb (57.6 kg)   LMP 12/25/2010   BMI 25.65 kg/m  Assessment & Plan:W  1. Essential hypertension Blood pressure is at goal on current medication regimen. No proteinuria present - lisinopril-hydrochlorothiazide (PRINZIDE,ZESTORETIC) 10-12.5 MG tablet; TAKE 1 TABLET BY MOUTH DAILY  Dispense: 30 tablet; Refill: 5 - COMPLETE METABOLIC PANEL WITH GFR  2. Chronic pain of both shoulders Patient has decreased range of motion to shoulders, poor internal and external rotation. Weakness present. She is having difficulty lifting shoulders to get dressed. Will send a referral to orthopedics.  - acetaminophen-codeine (TYLENOL #3) 300-30 MG tablet; Take 1 tablet by mouth every 8 (eight) hours as needed for moderate pain.  Dispense: 20 tablet; Refill: 0 - meloxicam (MOBIC) 15 MG tablet; Take 1 tablet (15 mg total) by mouth daily.  Dispense: 30 tablet; Refill: 0 - Ambulatory referral to Orthopedic Surgery  3. Shoulder weakness  - Ambulatory referral to Orthopedic Surgery  4. Gastroesophageal reflux disease without esophagitis The patient is asked to make an attempt to improve diet  patterns to aid in medical management of this problem. - omeprazole (PRILOSEC) 20 MG capsule; TAKE 2 CAPSULES BY MOUTH DAILY.  Dispense: 60 capsule; Refill: 0  5. Need for immunization against influenza - Flu Vaccine QUAD 36+ mos IM (Fluarix)   RTC: 3 months for hypertension and pap smear Dorena Dew, FNP

## 2016-01-31 ENCOUNTER — Ambulatory Visit: Payer: Medicare Other | Admitting: Family Medicine

## 2016-02-07 ENCOUNTER — Ambulatory Visit: Payer: Medicare Other | Admitting: Family Medicine

## 2016-02-14 ENCOUNTER — Telehealth: Payer: Self-pay

## 2016-02-14 NOTE — Telephone Encounter (Signed)
Refill request on Tylenol #3. LOV 01/28/2016. Please advise. Thanks!

## 2016-02-14 NOTE — Telephone Encounter (Signed)
Patient is requesting a refill on Tylenol #3. Reviewed [place] Substance Reporting system prior to prescribing opiate medications, multiple inconsistencies noted. No opiate medications will be prescribed from this practice.   Dorena Dew, FNP

## 2016-02-28 ENCOUNTER — Other Ambulatory Visit: Payer: Self-pay | Admitting: Family Medicine

## 2016-02-28 DIAGNOSIS — K219 Gastro-esophageal reflux disease without esophagitis: Secondary | ICD-10-CM

## 2016-03-25 ENCOUNTER — Encounter: Payer: Self-pay | Admitting: Family Medicine

## 2016-03-25 ENCOUNTER — Ambulatory Visit (INDEPENDENT_AMBULATORY_CARE_PROVIDER_SITE_OTHER): Payer: Medicare Other | Admitting: Family Medicine

## 2016-03-25 VITALS — BP 202/110 | HR 78 | Temp 98.1°F | Resp 16 | Ht 59.0 in | Wt 129.0 lb

## 2016-03-25 DIAGNOSIS — Z79891 Long term (current) use of opiate analgesic: Secondary | ICD-10-CM | POA: Insufficient documentation

## 2016-03-25 DIAGNOSIS — M25512 Pain in left shoulder: Secondary | ICD-10-CM | POA: Diagnosis not present

## 2016-03-25 DIAGNOSIS — I10 Essential (primary) hypertension: Secondary | ICD-10-CM | POA: Diagnosis not present

## 2016-03-25 LAB — COMPLETE METABOLIC PANEL WITH GFR
ALT: 14 U/L (ref 6–29)
AST: 24 U/L (ref 10–35)
Albumin: 3.4 g/dL — ABNORMAL LOW (ref 3.6–5.1)
Alkaline Phosphatase: 78 U/L (ref 33–130)
BUN: 3 mg/dL — AB (ref 7–25)
CHLORIDE: 100 mmol/L (ref 98–110)
CO2: 29 mmol/L (ref 20–31)
CREATININE: 0.8 mg/dL (ref 0.50–1.05)
Calcium: 8.9 mg/dL (ref 8.6–10.4)
GFR, Est African American: >89 mL/min (ref >=60)
GFR, Est Non African American: 83 mL/min (ref >=60)
Glucose, Bld: 87 mg/dL (ref 65–99)
Potassium: 3.3 mmol/L — ABNORMAL LOW (ref 3.5–5.3)
Sodium: 140 mmol/L (ref 135–146)
Total Bilirubin: 0.5 mg/dL (ref 0.2–1.2)
Total Protein: 7.2 g/dL (ref 6.1–8.1)

## 2016-03-25 LAB — CBC WITH DIFFERENTIAL/PLATELET
BASOS ABS: 44 {cells}/uL (ref 0–200)
Basophils Relative: 1 %
EOS ABS: 44 {cells}/uL (ref 15–500)
Eosinophils Relative: 1 %
HEMATOCRIT: 33.5 % — AB (ref 35.0–45.0)
Hemoglobin: 11.1 g/dL — ABNORMAL LOW (ref 11.7–15.5)
LYMPHS PCT: 27 %
Lymphs Abs: 1188 cells/uL (ref 850–3900)
MCH: 30.9 pg (ref 27.0–33.0)
MCHC: 33.1 g/dL (ref 32.0–36.0)
MCV: 93.3 fL (ref 80.0–100.0)
MONO ABS: 396 {cells}/uL (ref 200–950)
MPV: 8.5 fL (ref 7.5–12.5)
Monocytes Relative: 9 %
NEUTROS PCT: 62 %
Neutro Abs: 2728 cells/uL (ref 1500–7800)
Platelets: 280 10*3/uL (ref 140–400)
RBC: 3.59 MIL/uL — AB (ref 3.80–5.10)
RDW: 18.3 % — AB (ref 11.0–15.0)
WBC: 4.4 10*3/uL (ref 3.8–10.8)

## 2016-03-25 LAB — TSH: TSH: 0.52 mIU/L

## 2016-03-25 MED ORDER — CLONIDINE HCL 0.2 MG PO TABS
0.2000 mg | ORAL_TABLET | Freq: Once | ORAL | Status: AC
  Administered 2016-03-25: 0.2 mg via ORAL

## 2016-03-25 MED ORDER — LISINOPRIL-HYDROCHLOROTHIAZIDE 20-25 MG PO TABS: 1.0000 | ORAL_TABLET | Freq: Every day | ORAL | 0 refills | Status: DC

## 2016-03-25 NOTE — Progress Notes (Signed)
Subjective:    Patient ID: Toni Gutierrez, female    DOB: 09/22/1960, 56 y.o.   MRN: [id number]  Hypertension  This is a chronic problem. The current episode started more than 1 year ago (Blood pressure is markedly elevated on arrival. ). Associated symptoms include headaches. Pertinent negatives include no anxiety, blurred vision, malaise/fatigue, neck pain, orthopnea, palpitations, peripheral edema, PND, shortness of breath or sweats. Risk factors for coronary artery disease include dyslipidemia. Past treatments include ACE inhibitors and diuretics. The current treatment provides moderate improvement. There are no compliance problems.  There is no history of renovascular disease. There is no history of chronic renal disease, coarctation of the aorta, hyperaldosteronism, hypercortisolism, hyperparathyroidism, a hypertension causing med, pheochromocytoma, sleep apnea or a thyroid problem.  Shoulder Injury   The incident occurred at home. The left shoulder is affected. The incident occurred 5 to 7 days ago. The injury mechanism was a direct blow. The quality of the pain is described as aching. The pain does not radiate. The pain is at a severity of 10/10. The pain is severe. Pertinent negatives include no muscle weakness or numbness. The symptoms are aggravated by movement and overhead lifting. She has tried rest and non-weight bearing for the symptoms. The treatment provided no relief.   Past Medical History:  Diagnosis Date  . Alcohol abuse    No alcohol intake since 2001  . Allergic rhinitis   . Arthritis   . Carpal tunnel syndrome, right   . Chronic headache    Thought to be 2/2 cervical DJD vs anxiety. CT head in 2010 without any significant findings  . Chronic lower back pain    MRI of cervical, lumbar spine (all obtained by Dr. Ronnie Derby in 2006)- showing mild DDD with bulges but without disc herniation, at multiple levels; mild facet joint dz, primarily at L3-4 and L4-5 bilaterally  .  Heartburn   . Hyperlipidemia   . Hypertension   . Left knee pain    s/p ACL reconstruction by Dr. Ronnie Derby  . Shoulder pain, bilateral    MRI right shoulder showing tear of infraspinatus tendon with bursal surface fraying of the infraspinatus   Social History   Social History  . Marital status: Married    Spouse name: N/A  . Number of children: N/A  . Years of education: N/A   Occupational History  . Not on file.   Social History Main Topics  . Smoking status: Never Smoker  . Smokeless tobacco: Never Used  . Alcohol use No     Comment: History of abuse  . Drug use: No  . Sexual activity: Yes    Partners: Male   Other Topics Concern  . Not on file   Social History Narrative  . No narrative on file   Immunization History  Administered Date(s) Administered  . Influenza Split 03/17/2012, 11/17/2012  . Influenza Whole 12/23/2005, 12/23/2006, 11/21/2008  . Influenza,inj,Quad PF,36+ Mos 01/28/2016  . Tdap 03/27/2010    Review of Systems  Constitutional: Negative.  Negative for malaise/fatigue.  HENT: Negative.   Eyes: Negative.  Negative for blurred vision.  Respiratory: Negative.  Negative for shortness of breath.   Cardiovascular: Negative.  Negative for palpitations, orthopnea and PND.  Gastrointestinal: Negative.   Endocrine: Negative.   Genitourinary: Negative.   Musculoskeletal: Positive for arthralgias and myalgias. Negative for neck pain.  Skin: Negative.   Allergic/Immunologic: Negative.  Negative for immunocompromised state.  Neurological: Positive for headaches. Negative for numbness.  Hematological: Negative.  Psychiatric/Behavioral: Negative.        Objective:   Physical Exam  Constitutional: She is oriented to person, place, and time. She appears well-developed and well-nourished.  HENT:  Head: Normocephalic and atraumatic.  Right Ear: External ear normal.  Left Ear: External ear normal.  Nose: Nose normal.  Mouth/Throat: Oropharynx is clear  and moist.  Eyes: Conjunctivae and EOM are normal. Pupils are equal, round, and reactive to light.  Neck: Normal range of motion. Neck supple.  Cardiovascular: Normal rate, regular rhythm, normal heart sounds and intact distal pulses.   Pulmonary/Chest: Effort normal and breath sounds normal.  Abdominal: Soft. Bowel sounds are normal.  Musculoskeletal:       Left shoulder: She exhibits decreased range of motion, tenderness and pain.  Neurological: She is alert and oriented to person, place, and time. She has normal reflexes.  Skin: Skin is warm and dry.  Psychiatric: She has a normal mood and affect. Her behavior is normal. Judgment and thought content normal.      BP (!) 202/110 (BP Location: Right Arm, Patient Position: Sitting, Cuff Size: Normal)   Pulse 78   Temp 98.1 F (36.7 C) (Oral)   Resp 16   Ht 4\' 11"  (1.499 m)   Wt 129 lb (58.5 kg)   LMP 12/25/2010   SpO2 100%   BMI 26.05 kg/m  Assessment & Plan:  .1. Accelerated hypertension Blood pressure is above goal. Decreased following 0.2 mg of Clonidine.  - cloNIDine (CATAPRES) tablet 0.2 mg; Take 1 tablet (0.2 mg total) by mouth once. - TSH - COMPLETE METABOLIC PANEL WITH GFR - CBC with Differential  2. Essential hypertension - lisinopril-hydrochlorothiazide (PRINZIDE,ZESTORETIC) 20-25 MG tablet; Take 1 tablet by mouth daily.  Dispense: 30 tablet; Refill: 0  3. Chronic prescription opiate use Reviewed [place] Substance Reporting system prior to prescribing pain medication, inconsistencies noted. She has been getting opiate medications consistently from Dr. Ricke Hey. She is requesting pain medications for shoulder pain. Patient will need to continue to follow up with Dr. Alyson Ingles. I will forward notes to his office.   - Pain Mgmt, Profile 8 w/Conf, U  4. Acute pain of left shoulder #3   RTC: Discussed with patient that she can have 1 primary provider. I treated accelerated hypertension and will forward notes to Dr.  Alyson Ingles for further workup and evaluation.    Dorena Dew, FNP

## 2016-03-25 NOTE — Patient Instructions (Addendum)
Will fax all information to Dr. Alyson Ingles for further workup and evaluation.  Sent Lisinopril hydrochlorothiazide 20-25 mg daily into pharmacy  Hypertension Hypertension is another name for high blood pressure. High blood pressure forces your heart to work harder to pump blood. A blood pressure reading has two numbers, which includes a higher number over a lower number (example: 110/72). Follow these instructions at home:  Have your blood pressure rechecked by your doctor.  Only take medicine as told by your doctor. Follow the directions carefully. The medicine does not work as well if you skip doses. Skipping doses also puts you at risk for problems.  Do not smoke.  Monitor your blood pressure at home as told by your doctor. Contact a doctor if:  You think you are having a reaction to the medicine you are taking.  You have repeat headaches or feel dizzy.  You have puffiness (swelling) in your ankles.  You have trouble with your vision. Get help right away if:  You get a very bad headache and are confused.  You feel weak, numb, or faint.  You get chest or belly (abdominal) pain.  You throw up (vomit).  You cannot breathe very well. This information is not intended to replace advice given to you by your health care provider. Make sure you discuss any questions you have with your health care provider. Document Released: 07/29/2007 Document Revised: 07/18/2015 Document Reviewed: 12/02/2012 Elsevier Interactive Patient Education  2017 [employer].

## 2016-03-28 ENCOUNTER — Observation Stay (HOSPITAL_COMMUNITY)
Admission: EM | Admit: 2016-03-28 | Discharge: 2016-03-29 | Disposition: A | Discharge status: Home / Self Care | Payer: Medicare Other | Attending: Internal Medicine | Admitting: Internal Medicine

## 2016-03-28 ENCOUNTER — Emergency Department (HOSPITAL_COMMUNITY): Payer: Medicare Other

## 2016-03-28 ENCOUNTER — Encounter (HOSPITAL_COMMUNITY): Payer: Self-pay

## 2016-03-28 DIAGNOSIS — W19XXXA Unspecified fall, initial encounter: Secondary | ICD-10-CM | POA: Insufficient documentation

## 2016-03-28 DIAGNOSIS — Z79899 Other long term (current) drug therapy: Secondary | ICD-10-CM | POA: Insufficient documentation

## 2016-03-28 DIAGNOSIS — E876 Hypokalemia: Principal | ICD-10-CM | POA: Insufficient documentation

## 2016-03-28 DIAGNOSIS — J309 Allergic rhinitis, unspecified: Secondary | ICD-10-CM | POA: Insufficient documentation

## 2016-03-28 DIAGNOSIS — I10 Essential (primary) hypertension: Secondary | ICD-10-CM | POA: Diagnosis not present

## 2016-03-28 DIAGNOSIS — K219 Gastro-esophageal reflux disease without esophagitis: Secondary | ICD-10-CM | POA: Diagnosis not present

## 2016-03-28 DIAGNOSIS — R531 Weakness: Secondary | ICD-10-CM | POA: Diagnosis present

## 2016-03-28 DIAGNOSIS — E871 Hypo-osmolality and hyponatremia: Secondary | ICD-10-CM | POA: Diagnosis not present

## 2016-03-28 DIAGNOSIS — M25512 Pain in left shoulder: Secondary | ICD-10-CM | POA: Diagnosis not present

## 2016-03-28 DIAGNOSIS — G8929 Other chronic pain: Secondary | ICD-10-CM | POA: Diagnosis present

## 2016-03-28 DIAGNOSIS — R0789 Other chest pain: Secondary | ICD-10-CM | POA: Insufficient documentation

## 2016-03-28 DIAGNOSIS — R079 Chest pain, unspecified: Secondary | ICD-10-CM

## 2016-03-28 DIAGNOSIS — D649 Anemia, unspecified: Secondary | ICD-10-CM

## 2016-03-28 LAB — CBC WITH DIFFERENTIAL/PLATELET
Basophils Absolute: 0 10*3/uL (ref 0.0–0.1)
Basophils Relative: 0 %
Eosinophils Absolute: 0.2 10*3/uL (ref 0.0–0.7)
Eosinophils Relative: 4 %
HCT: 28.6 % — ABNORMAL LOW (ref 36.0–46.0)
Hemoglobin: 9.7 g/dL — ABNORMAL LOW (ref 12.0–15.0)
Lymphocytes Relative: 45 %
Lymphs Abs: 2 10*3/uL (ref 0.7–4.0)
MCH: 31.4 pg (ref 26.0–34.0)
MCHC: 33.9 g/dL (ref 30.0–36.0)
MCV: 92.6 fL (ref 78.0–100.0)
Monocytes Absolute: 0.3 10*3/uL (ref 0.1–1.0)
Monocytes Relative: 8 %
Neutro Abs: 1.9 10*3/uL (ref 1.7–7.7)
Neutrophils Relative %: 43 %
Platelets: 212 10*3/uL (ref 150–400)
RBC: 3.09 MIL/uL — ABNORMAL LOW (ref 3.87–5.11)
RDW: 17.4 % — ABNORMAL HIGH (ref 11.5–15.5)
WBC: 4.5 10*3/uL (ref 4.0–10.5)

## 2016-03-28 LAB — PAIN MGMT, PROFILE 8 W/CONF, U
6 ACETYLMORPHINE: NEGATIVE ng/mL (ref <10)
Alcohol Metabolites: POSITIVE ng/mL — AB (ref <500)
Amphetamines: NEGATIVE ng/mL (ref <500)
BENZODIAZEPINES: NEGATIVE ng/mL (ref <100)
BUPRENORPHINE: NEGATIVE ng/mL (ref <5)
CREATININE: 64.5 mg/dL (ref >=20.0)
Cocaine Metabolite: NEGATIVE ng/mL (ref <150)
Codeine: NEGATIVE ng/mL (ref <50)
ETHYL SULFATE (ETS): 6118 ng/mL — AB (ref <100)
Ethyl Glucuronide (ETG): 5852 ng/mL — ABNORMAL HIGH (ref <500)
HYDROMORPHONE: 405 ng/mL — AB (ref <50)
Hydrocodone: 675 ng/mL — ABNORMAL HIGH (ref <50)
MDMA: NEGATIVE ng/mL (ref <500)
MORPHINE: NEGATIVE ng/mL (ref <50)
Marijuana Metabolite: NEGATIVE ng/mL (ref <20)
Norhydrocodone: 463 ng/mL — ABNORMAL HIGH (ref <50)
OXIDANT: NEGATIVE ug/mL (ref <200)
Opiates: POSITIVE ng/mL — AB (ref <100)
Oxycodone: NEGATIVE ng/mL (ref <100)
PH: 7.36 (ref 4.5–9.0)
Please note:: 0

## 2016-03-28 LAB — BASIC METABOLIC PANEL
ANION GAP: 13 (ref 5–15)
BUN: 5 mg/dL — ABNORMAL LOW (ref 6–20)
CALCIUM: 8.1 mg/dL — AB (ref 8.9–10.3)
CO2: 24 mmol/L (ref 22–32)
Chloride: 93 mmol/L — ABNORMAL LOW (ref 101–111)
Creatinine, Ser: 0.88 mg/dL (ref 0.44–1.00)
GFR calc Af Amer: >60 mL/min (ref >60)
GFR calc non Af Amer: >60 mL/min (ref >60)
GLUCOSE: 94 mg/dL (ref 65–99)
Potassium: 2.7 mmol/L — CL (ref 3.5–5.1)
Sodium: 130 mmol/L — ABNORMAL LOW (ref 135–145)

## 2016-03-28 LAB — MAGNESIUM: Magnesium: 1.8 mg/dL (ref 1.7–2.4)

## 2016-03-28 LAB — I-STAT TROPONIN, ED: Troponin i, poc: 0 ng/mL (ref 0.00–0.08)

## 2016-03-28 MED ORDER — POTASSIUM CHLORIDE CRYS ER 20 MEQ PO TBCR
40.0000 meq | EXTENDED_RELEASE_TABLET | Freq: Once | ORAL | Status: AC
  Administered 2016-03-28: 40 meq via ORAL
  Filled 2016-03-28: qty 2

## 2016-03-28 MED ORDER — SODIUM CHLORIDE 0.9 % IV SOLN
30.0000 meq | Freq: Once | INTRAVENOUS | Status: AC
  Administered 2016-03-28: 30 meq via INTRAVENOUS
  Filled 2016-03-28: qty 15

## 2016-03-28 NOTE — ED Triage Notes (Signed)
Onset last night pt fell down 8 steps.  C/o left chest pain when coughing and lifting left arm.  Pt did not get dizzy or have any gait disturbances.  Pt reports has fell two other times in the past week.  No known reason.

## 2016-03-28 NOTE — ED Provider Notes (Signed)
[facility] DEPT Provider Note   CSN: [id number] Arrival date & time: 03/28/16  [id number]     History   Chief Complaint Chief Complaint  Patient presents with  . Fall  . Chest Pain    HPI Toni Gutierrez is a 56 y.o. female.  Toni Gutierrez is a 56 y.o. Female with a history of alcohol abuse, hypertension and hyperlipidemia who presents to the emergency department after a fall yesterday. Patient reports she is at the top of some stairs when she bent over to kiss her grandchild and then fell over landing on her left chest wall. She denies hitting her head or loss of consciousness. She denies feeling lightheaded or dizzy. She denies having any chest pain or shortness of breath. She denies any prodromal symptoms prior to falling. Patient complains of left chest wall pain and pain to her left upper arm. She puts her pain is worse with movement, and deep inspiration. She tells me over the past 2 weeks she's fallen twice. She denies any prodromal symptoms prior to these falls. She tells me 4 days ago she had her hydrochlorothiazide increased by her primary care doctor. She denies any symptoms of lightheadedness or dizziness. She denies recent alcohol use. She denies fevers, recent illness, dizziness, lightheadedness, syncope, headache, changes to her vision, numbness, tingling, weakness, shortness of breath, palpitations, or rashes.   The history is provided by the patient, medical records and a relative. No language interpreter was used.  Fall  Associated symptoms include chest pain. Pertinent negatives include no abdominal pain, no headaches and no shortness of breath.  Chest Pain   Pertinent negatives include no abdominal pain, no back pain, no cough, no dizziness, no fever, no headaches, no nausea, no numbness, no palpitations, no shortness of breath, no vomiting and no weakness.  Pertinent negatives for past medical history include no seizures.    Past Medical History:  Diagnosis Date    . Alcohol abuse    No alcohol intake since 2001  . Allergic rhinitis   . Arthritis   . Carpal tunnel syndrome, right   . Chronic headache    Thought to be 2/2 cervical DJD vs anxiety. CT head in 2010 without any significant findings  . Chronic lower back pain    MRI of cervical, lumbar spine (all obtained by Dr. Ronnie Derby in 2006)- showing mild DDD with bulges but without disc herniation, at multiple levels; mild facet joint dz, primarily at L3-4 and L4-5 bilaterally  . Heartburn   . Hyperlipidemia   . Hypertension   . Left knee pain    s/p ACL reconstruction by Dr. Ronnie Derby  . Shoulder pain, bilateral    MRI right shoulder showing tear of infraspinatus tendon with bursal surface fraying of the infraspinatus    Patient Active Problem List   Diagnosis Date Noted  . Chronic prescription opiate use 03/25/2016  . Essential hypertension 07/29/2015  . Rhinitis, allergic 07/29/2015  . GERD (gastroesophageal reflux disease) 11/17/2012  . Chronic back pain 11/17/2012  . Dental caries 11/17/2012  . Sciatica 11/17/2012  . Hypokalemia 08/16/2012  . IT band syndrome 11/16/2011  . Right leg pain 08/31/2011  . Mononeuropathy of left upper extremity 07/15/2010  . Preventative health care 03/27/2010  . Acute pain of left shoulder 03/02/2007  . PAIN, CHRONIC NEC 04/27/2006  . HYPERLIPIDEMIA 03/02/2006  . ALCOHOL ABUSE 11/20/2005  . CARPAL TUNNEL SYNDROME, RIGHT 11/20/2005  . ALLERGIC RHINITIS 11/20/2005    Past Surgical History:  Procedure Laterality  Date  . ANTERIOR CRUCIATE LIGAMENT REPAIR     By Dr. Ronnie Derby  . ROTATOR CUFF REPAIR     Right shoulder  . TUBAL LIGATION  1989    OB History    No data available       Home Medications    Prior to Admission medications   Medication Sig Start Date End Date Taking? Authorizing Provider  cetirizine (ZYRTEC) 10 MG tablet Take 1 tablet (10 mg total) by mouth daily. 10/09/15  Yes Micheline Chapman, NP  omeprazole (PRILOSEC) 20 MG capsule take  2 capsules by mouth once daily 02/28/16  Yes Dorena Dew, FNP    Family History Family History  Problem Relation Age of Onset  . Stroke Mother     Social History Social History  Substance Use Topics  . Smoking status: Never Smoker  . Smokeless tobacco: Never Used  . Alcohol use No     Comment: History of abuse     Allergies   Patient has no known allergies.   Review of Systems Review of Systems  Constitutional: Negative for chills and fever.  HENT: Negative for congestion and sore throat.   Eyes: Negative for visual disturbance.  Respiratory: Negative for cough, shortness of breath and wheezing.   Cardiovascular: Positive for chest pain. Negative for palpitations and leg swelling.  Gastrointestinal: Negative for abdominal pain, diarrhea, nausea and vomiting.  Genitourinary: Negative for dysuria, frequency and urgency.  Musculoskeletal: Positive for arthralgias. Negative for back pain and neck pain.  Skin: Negative for rash and wound.  Neurological: Negative for dizziness, seizures, syncope, speech difficulty, weakness, light-headedness, numbness and headaches.     Physical Exam Updated Vital Signs BP 126/73   Pulse 78   Temp 98.2 F (36.8 C) (Oral)   Resp 14   Ht 4\' 11"  (1.499 m)   Wt 58.5 kg   LMP 12/25/2010   SpO2 99%   BMI 26.05 kg/m   Physical Exam  Constitutional: She is oriented to person, place, and time. She appears well-developed and well-nourished. No distress.  Nontoxic appearing.  HENT:  Head: Normocephalic and atraumatic.  Right Ear: External ear normal.  Left Ear: External ear normal.  Mouth/Throat: Oropharynx is clear and moist.  No visible signs of head trauma. Bilateral tympanic membranes are pearly-gray without erythema or loss of landmarks.   Eyes: Conjunctivae and EOM are normal. Pupils are equal, round, and reactive to light. Right eye exhibits no discharge. Left eye exhibits no discharge.  Neck: Normal range of motion. Neck  supple. No JVD present. No tracheal deviation present.  No midline neck tenderness to palpation.  Cardiovascular: Normal rate, regular rhythm, normal heart sounds and intact distal pulses.  Exam reveals no gallop and no friction rub.   No murmur heard. Pulmonary/Chest: Effort normal and breath sounds normal. No stridor. No respiratory distress. She has no wheezes. She has no rales. She exhibits tenderness.  Lungs clear to auscultation bilaterally. Symmetric chest expansion bilaterally. Left chest wall and left lateral chest wall is tender to palpation and reproduces her pain. No crepitus.  No deformity. No flail segment.  Abdominal: Soft. There is no tenderness.  Musculoskeletal: Normal range of motion. She exhibits no edema or tenderness.  Lymphadenopathy:    She has no cervical adenopathy.  Neurological: She is alert and oriented to person, place, and time. No cranial nerve deficit or sensory deficit. She exhibits normal muscle tone. Coordination normal.  Patient is alert and oriented 3. Cranial nerves are intact.  Speech is clear and coherent. No pronator drift. Finger to nose intact. Normal gait. Sensation is intact in his bilateral upper and lower extremities.   Skin: Skin is warm and dry. Capillary refill takes less than 2 seconds. No rash noted. She is not diaphoretic. No erythema. No pallor.  Psychiatric: She has a normal mood and affect. Her behavior is normal.  Nursing note and vitals reviewed.    ED Treatments / Results  Labs (all labs ordered are listed, but only abnormal results are displayed) Labs Reviewed  BASIC METABOLIC PANEL - Abnormal; Notable for the following:       Result Value   Sodium 130 (*)    Potassium 2.7 (*)    Chloride 93 (*)    BUN 5 (*)    Calcium 8.1 (*)    All other components within normal limits  CBC WITH DIFFERENTIAL/PLATELET - Abnormal; Notable for the following:    RBC 3.09 (*)    Hemoglobin 9.7 (*)    HCT 28.6 (*)    RDW 17.4 (*)    All other  components within normal limits  MAGNESIUM  ETHANOL  I-STAT TROPOININ, ED    EKG  EKG Interpretation  Date/Time:  Saturday March 28 2016 20:08:54 EST Ventricular Rate:  75 PR Interval:    QRS Duration: 80 QT Interval:  385 QTC Calculation: 430 R Axis:   67 Text Interpretation:  Sinus rhythm Borderline T abnormalities, diffuse leads Confirmed by Hazle Coca [phone]) on 03/28/2016 8:32:20 PM       Radiology Dg Ribs Unilateral W/chest Left  Result Date: 03/28/2016 CLINICAL DATA:  Pt says she fell multiple times throughout this past week randomly; pt denies lightheadness and vertigo prior to fall. Pt fell at church on Sunday, going up the stairs at home, and at daughter's house yesterday. EXAM: LEFT RIBS AND CHEST - 3+ VIEW COMPARISON:  Chest x-ray dated 01/23/2009. FINDINGS: Heart size is upper normal. Now appreciated is a large hiatal hernia. Lungs are clear. No pleural effusion or pneumothorax seen. Osseous structures about the chest are unremarkable. No left-sided rib fracture or displacement seen. IMPRESSION: 1. Large hiatal hernia. 2. No acute cardiopulmonary disease. 3. No left-sided rib fracture or displacement seen. Electronically Signed   By: Franki Cabot M.D.   On: 03/28/2016 21:08   Dg Humerus Left  Result Date: 03/28/2016 CLINICAL DATA:  Multiple falls this week. EXAM: LEFT HUMERUS - 2+ VIEW COMPARISON:  None. FINDINGS: There is no evidence of fracture or other focal bone lesions. Soft tissues are unremarkable. IMPRESSION: Negative. Electronically Signed   By: Franki Cabot M.D.   On: 03/28/2016 21:09    Procedures Procedures (including critical care time)  Medications Ordered in ED Medications  potassium chloride 30 mEq in sodium chloride 0.9 % 265 mL (KCL MULTIRUN) IVPB (30 mEq Intravenous Given 03/28/16 2306)  potassium chloride SA (K-DUR,KLOR-CON) CR tablet 40 mEq (40 mEq Oral Given 03/28/16 2306)     Initial Impression / Assessment and Plan / ED Course  I have  reviewed the triage vital signs and the nursing notes.  Pertinent labs & imaging results that were available during my care of the patient were reviewed by me and considered in my medical decision making (see chart for details).    This is a 56 y.o. Female with a history of alcohol abuse, hypertension and hyperlipidemia who presents to the emergency department after a fall yesterday. Patient reports she is at the top of some stairs when she bent  over to kiss her grandchild and then fell over landing on her left chest wall. She denies hitting her head or loss of consciousness. She denies feeling lightheaded or dizzy. She denies having any chest pain or shortness of breath. She denies any prodromal symptoms prior to falling. Patient complains of left chest wall pain and pain to her left upper arm. She puts her pain is worse with movement, and deep inspiration. She tells me over the past 2 weeks she's fallen twice. She denies any prodromal symptoms prior to these falls. She tells me 4 days ago she had her hydrochlorothiazide increased by her primary care doctor.  On exam patient is afebrile nontoxic appearing.  Lungs clear to auscultation bilaterally. She has some external chest wall tenderness to palpation. No crepitus. No deformity. She is neurovascular intact. Normal gait. No focal neurological deficits. BMP is normal for sodium of 1:30, potassium 2.7. CBC is normal for him brought of 9.7 which is a 2 g drop since 4 days ago. Magnesium is within normal limits. Initial troponin is not elevated. Chest x-ray with left rib showed no acute cardiopulmonary abnormalities and no left-sided rib fracture. Left humerus x-ray is unremarkable. Patient ordered 30 mEq of potassium IV and 40 mEq of potassium orally.  The patient's anemia, hypokalemia and these can explain falls, will admit to medicine for observation. I consulted with hospitalist Dr. Loleta Books who accepted patient for admission.   This patient was  discussed with and evaluated by Dr. Ralene Bathe who agrees with assessment and plan.   Final Clinical Impressions(s) / ED Diagnoses   Final diagnoses:  Hypokalemia  Fall, initial encounter  Nonspecific chest pain  Anemia, unspecified type    New Prescriptions New Prescriptions   No medications on file     Waynetta Pean, PA-C 03/29/16 [place], MD 03/29/16 1255

## 2016-03-28 NOTE — ED Notes (Signed)
Offered warm blankets and updates in the lobby

## 2016-03-29 ENCOUNTER — Encounter (HOSPITAL_COMMUNITY): Payer: Self-pay | Admitting: Family Medicine

## 2016-03-29 DIAGNOSIS — E876 Hypokalemia: Secondary | ICD-10-CM | POA: Diagnosis not present

## 2016-03-29 DIAGNOSIS — I10 Essential (primary) hypertension: Secondary | ICD-10-CM | POA: Diagnosis not present

## 2016-03-29 DIAGNOSIS — G8929 Other chronic pain: Secondary | ICD-10-CM | POA: Diagnosis not present

## 2016-03-29 DIAGNOSIS — M25512 Pain in left shoulder: Secondary | ICD-10-CM | POA: Diagnosis not present

## 2016-03-29 LAB — BASIC METABOLIC PANEL
Anion gap: 8 (ref 5–15)
CHLORIDE: 100 mmol/L — AB (ref 101–111)
CO2: 26 mmol/L (ref 22–32)
Calcium: 8.5 mg/dL — ABNORMAL LOW (ref 8.9–10.3)
Creatinine, Ser: 0.84 mg/dL (ref 0.44–1.00)
GFR calc Af Amer: >60 mL/min (ref >60)
GLUCOSE: 89 mg/dL (ref 65–99)
POTASSIUM: 4.1 mmol/L (ref 3.5–5.1)
Sodium: 134 mmol/L — ABNORMAL LOW (ref 135–145)

## 2016-03-29 LAB — ETHANOL: Alcohol, Ethyl (B): <5 mg/dL (ref <5)

## 2016-03-29 MED ORDER — POTASSIUM CHLORIDE CRYS ER 20 MEQ PO TBCR: 20.0000 meq | EXTENDED_RELEASE_TABLET | Freq: Every day | ORAL | Status: DC

## 2016-03-29 MED ORDER — LISINOPRIL 20 MG PO TABS: 20.0000 mg | ORAL_TABLET | Freq: Every day | ORAL | 3 refills | Status: DC

## 2016-03-29 MED ORDER — ACETAMINOPHEN 325 MG PO TABS: 650.0000 mg | ORAL_TABLET | Freq: Four times a day (QID) | ORAL | Status: DC | PRN

## 2016-03-29 MED ORDER — TRAMADOL HCL 50 MG PO TABS: 50.0000 mg | ORAL_TABLET | Freq: Four times a day (QID) | ORAL | 0 refills | Status: DC | PRN

## 2016-03-29 MED ORDER — AMLODIPINE BESYLATE 5 MG PO TABS
5.0000 mg | ORAL_TABLET | Freq: Every day | ORAL | Status: DC
  Administered 2016-03-29: 5 mg via ORAL
  Filled 2016-03-29: qty 1

## 2016-03-29 MED ORDER — TRAMADOL HCL 50 MG PO TABS
50.0000 mg | ORAL_TABLET | Freq: Four times a day (QID) | ORAL | Status: DC | PRN
  Administered 2016-03-29: 50 mg via ORAL
  Filled 2016-03-29: qty 1

## 2016-03-29 MED ORDER — OMEPRAZOLE 20 MG PO CPDR: 40.0000 mg | DELAYED_RELEASE_CAPSULE | Freq: Every day | ORAL | 0 refills | Status: AC

## 2016-03-29 MED ORDER — ACETAMINOPHEN 325 MG PO TABS: 650.0000 mg | ORAL_TABLET | Freq: Four times a day (QID) | ORAL | 3 refills | Status: DC | PRN

## 2016-03-29 MED ORDER — ENOXAPARIN SODIUM 40 MG/0.4ML ~~LOC~~ SOLN
40.0000 mg | Freq: Every day | SUBCUTANEOUS | Status: DC
  Administered 2016-03-29: 40 mg via SUBCUTANEOUS
  Filled 2016-03-29: qty 0.4

## 2016-03-29 MED ORDER — LISINOPRIL 20 MG PO TABS
20.0000 mg | ORAL_TABLET | Freq: Every day | ORAL | Status: DC
  Administered 2016-03-29: 20 mg via ORAL
  Filled 2016-03-29: qty 1

## 2016-03-29 MED ORDER — AMLODIPINE BESYLATE 5 MG PO TABS: 5.0000 mg | ORAL_TABLET | Freq: Every day | ORAL | 3 refills | Status: DC

## 2016-03-29 MED ORDER — NAPROXEN 250 MG PO TABS
250.0000 mg | ORAL_TABLET | Freq: Two times a day (BID) | ORAL | Status: DC
  Administered 2016-03-29: 250 mg via ORAL
  Filled 2016-03-29: qty 1

## 2016-03-29 MED ORDER — NAPROXEN 250 MG PO TABS: 250.0000 mg | ORAL_TABLET | Freq: Two times a day (BID) | ORAL | 0 refills | Status: DC

## 2016-03-29 MED ORDER — SODIUM CHLORIDE 0.9% FLUSH
3.0000 mL | Freq: Two times a day (BID) | INTRAVENOUS | Status: DC
  Administered 2016-03-29 (×2): 3 mL via INTRAVENOUS

## 2016-03-29 MED ORDER — ASPIRIN 81 MG PO CHEW: 324.0000 mg | CHEWABLE_TABLET | Freq: Once | ORAL | Status: DC

## 2016-03-29 MED ORDER — OXYCODONE-ACETAMINOPHEN 5-325 MG PO TABS: 1.0000 | ORAL_TABLET | Freq: Four times a day (QID) | ORAL | 0 refills | Status: DC | PRN

## 2016-03-29 NOTE — H&P (Signed)
History and Physical  Patient Name: Toni Gutierrez     [id number]    DOB: 09-May-1960    DOA: 03/28/2016 PCP: Ricke Hey, MD   Patient coming from: Home  Chief Complaint: Fall, shoulder pain, weakness  HPI: Toni Gutierrez is a 56 y.o. female with a past medical history significant for HTN, chronic low back pain and chronic shoulder pain who presents with fall.  Is in her usual state of health until the last couple weeks when she's felt several times like she fell for no reason. The first time happened in the bathroom at church, the most recent time happened last night on the stairs leading her daughter's house. As likely the previous times, the patient was just standing there, did not lose consciousness, and suddenly "like my legs give out", just collapsed and slid down the stairs. She had no preceding dizziness, lightheadedness, nausea, feeling of warmth, diaphoresis, chest pressure, palpitations, but afterwards because she landed on her right shoulder she had left shoulder and left chest discomfort. Today this left shoulder and chest discomfort persisted, so she came to the ER.  Of note, the patient saw her PCP a few days ago after the fall at church, was noted to be hypertensive to 200/100 in the office, and was prescribed clonidine (which she did not take) but also had her lisinopril-HCTZ dose doubled.  K at that time was 3.3.  ED course: -Afebrile, heart rate 97, respirations and pulse ox normal, blood pressure 100/65 -Na 130, K 2.7, Cr 0.88, WBC 4.5K, Hgb 9.7 (baseline around 10-11) -Magnesium 1.8 -Troponin negative, ECG showed normal sinus rhythm  -Dedicated rib films and humerus radiograph were negative for fracture -She was given K 70 mEQ via IV and oral and TRH were asked to observe overnight     ROS: Review of Systems  Gastrointestinal: Negative for diarrhea, nausea and vomiting.  Neurological: Positive for weakness. Negative for dizziness, tingling, tremors,  sensory change, speech change, focal weakness, seizures, loss of consciousness and headaches.       No confusion          Past Medical History:  Diagnosis Date  . Alcohol abuse    No alcohol intake since 2001  . Allergic rhinitis   . Arthritis   . Carpal tunnel syndrome, right   . Chronic headache    Thought to be 2/2 cervical DJD vs anxiety. CT head in 2010 without any significant findings  . Chronic lower back pain    MRI of cervical, lumbar spine (all obtained by Dr. Ronnie Derby in 2006)- showing mild DDD with bulges but without disc herniation, at multiple levels; mild facet joint dz, primarily at L3-4 and L4-5 bilaterally  . Heartburn   . Hyperlipidemia   . Hypertension   . Left knee pain    s/p ACL reconstruction by Dr. Ronnie Derby  . Shoulder pain, bilateral    MRI right shoulder showing tear of infraspinatus tendon with bursal surface fraying of the infraspinatus    Past Surgical History:  Procedure Laterality Date  . ANTERIOR CRUCIATE LIGAMENT REPAIR     By Dr. Ronnie Derby  . ROTATOR CUFF REPAIR     Right shoulder  . TUBAL LIGATION  1989    Social History: The patient walks unassisted.  She is not a smoker.  She is retired from working as a Technical sales engineer at [employer].  She denies alcohol or ilicit drugs.    No Known Allergies  Family history: family history includes Stroke in  her mother.  Prior to Admission medications   Medication Sig Start Date End Date Taking? Authorizing Provider  cetirizine (ZYRTEC) 10 MG tablet Take 1 tablet (10 mg total) by mouth daily. 10/09/15  Yes Micheline Chapman, NP  lisinopril-hydrochlorothiazide (PRINZIDE,ZESTORETIC) 20-25 MG tablet Take 1 tablet by mouth daily. 03/25/16  Yes Dorena Dew, FNP  omeprazole (PRILOSEC) 20 MG capsule take 2 capsules by mouth once daily 02/28/16  Yes Dorena Dew, FNP       Physical Exam: BP 121/87   Pulse 76   Temp 98.2 F (36.8 C) (Oral)   Resp 15   Ht 4\' 11"  (1.499 m)   Wt 58.5 kg (129 lb)   LMP  12/25/2010   SpO2 97%   BMI 26.05 kg/m  General appearance: Well-developed, adult female, alert and in no acute distress.   Eyes: Anicteric, conjunctiva pink, lids and lashes normal. PERRL.    ENT: No nasal deformity, discharge, epistaxis.  Hearing normal. OP moist without lesions.   Neck: No neck masses.  Trachea midline.  No thyromegaly/tenderness. Lymph: No cervical or supraclavicular lymphadenopathy. Skin: Warm and dry.  No jaundice.  No suspicious rashes or lesions. Cardiac: RRR, nl S1-S2, no murmurs appreciated.  Capillary refill is brisk.  JVP normal.  No LE edema.  Radial and DP pulses 2+ and symmetric.  No carotid bruits.  Respiratory: Normal respiratory rate and rhythm.  CTAB without rales or wheezes. Abdomen: Abdomen soft.  No TTP. No ascites, distension, hepatosplenomegaly.   MSK: No deformities or effusions.  No cyanosis or clubbing.  Pain with movement of left arm, worst with abduction which she cannot do, pain in arm and chest with shifting in bed, pain to palpation over left shoulder.  Neuro: Cranial nerves 3-12 intact.  Sensation intact to light touch. Speech is fluent.  Muscle strength normal except left arm limited by pain.    Psych: Sensorium intact and responding to questions, attention normal.  Behavior appropriate.  Affect normal.  Judgment and insight appear normal.     Labs on Admission:  I have personally reviewed following labs and imaging studies: CBC:  Recent Labs Lab 03/25/16 1047 03/28/16 2140  WBC 4.4 4.5  NEUTROABS 2,728 1.9  HGB 11.1* 9.7*  HCT 33.5* 28.6*  MCV 93.3 92.6  PLT 280 [id number]   Basic Metabolic Panel:  Recent Labs Lab 03/25/16 1047 03/28/16 2140  NA 140 130*  K 3.3* 2.7*  CL 100 93*  CO2 29 24  GLUCOSE 87 94  BUN 3* 5*  CREATININE 0.80 0.88  CALCIUM 8.9 8.1*  MG  --  1.8   GFR: Estimated Creatinine Clearance: 55.6 mL/min (by C-G formula based on SCr of 0.88 mg/dL).  Liver Function Tests:  Recent Labs Lab 03/25/16 1047    AST 24  ALT 14  ALKPHOS 78  BILITOT 0.5  PROT 7.2  ALBUMIN 3.4*   No results for input(s): LIPASE, AMYLASE in the last 168 hours. No results for input(s): AMMONIA in the last 168 hours. Coagulation Profile: No results for input(s): INR, PROTIME in the last 168 hours. Cardiac Enzymes: No results for input(s): CKTOTAL, CKMB, CKMBINDEX, TROPONINI in the last 168 hours. BNP (last 3 results) No results for input(s): PROBNP in the last 8760 hours. HbA1C: No results for input(s): HGBA1C in the last 72 hours. CBG: No results for input(s): GLUCAP in the last 168 hours. Lipid Profile: No results for input(s): CHOL, HDL, LDLCALC, TRIG, CHOLHDL, LDLDIRECT in the last 72 hours. Thyroid  Function Tests: No results for input(s): TSH, T4TOTAL, FREET4, T3FREE, THYROIDAB in the last 72 hours. Anemia Panel: No results for input(s): VITAMINB12, FOLATE, FERRITIN, TIBC, IRON, RETICCTPCT in the last 72 hours. Sepsis Labs: Invalid input(s): PROCALCITONIN, LACTICIDVEN No results found for this or any previous visit (from the past 240 hour(s)).       Radiological Exams on Admission: Personally reviewed: Dg Ribs Unilateral W/chest Left  Result Date: 03/28/2016 CLINICAL DATA:  Pt says she fell multiple times throughout this past week randomly; pt denies lightheadness and vertigo prior to fall. Pt fell at church on Sunday, going up the stairs at home, and at daughter's house yesterday. EXAM: LEFT RIBS AND CHEST - 3+ VIEW COMPARISON:  Chest x-ray dated 01/23/2009. FINDINGS: Heart size is upper normal. Now appreciated is a large hiatal hernia. Lungs are clear. No pleural effusion or pneumothorax seen. Osseous structures about the chest are unremarkable. No left-sided rib fracture or displacement seen. IMPRESSION: 1. Large hiatal hernia. 2. No acute cardiopulmonary disease. 3. No left-sided rib fracture or displacement seen. Electronically Signed   By: Franki Cabot M.D.   On: 03/28/2016 21:08   Dg Humerus  Left  Result Date: 03/28/2016 CLINICAL DATA:  Multiple falls this week. EXAM: LEFT HUMERUS - 2+ VIEW COMPARISON:  None. FINDINGS: There is no evidence of fracture or other focal bone lesions. Soft tissues are unremarkable. IMPRESSION: Negative. Electronically Signed   By: Franki Cabot M.D.   On: 03/28/2016 21:09    EKG: Independently reviewed. Rate 75, QTc 430, nonspecific TW flattening, diffuse, similar to previous.    Assessment/Plan  1. Hypokalemia:  No recent diarrhea.  Normal magnesium.  No alcohol use.  Recent increase in HCTZ.   -Stop HCTZ -K supplements -Repeat K tomorrow   2. Hyponatremia:  Mild, in setting of HCTZ increase. -Hold HCTZ  3. Fall and shoulder and chest pain:  She has acute on chronic pain in her left shoulder and chest from the fall, worse with palpation.  Low suspicion for angina.  There was no loss of consciousness. -Naproxen for 3 days  4. Hypertension:  -Continue lisinopril  -Start amlodipine            DVT prophylaxis: Lovenox  Code Status: FULL  Family Communication: NOne present  Disposition Plan: Anticipate supplement K and repeat tomorrow.  If K and Na improved tomororw, discharge with PCP follow up early next week for repeat BMP.  Start oral K supplement and new BP med in place of HCTZ Consults called: None Admission status: OBS At the point of initial evaluation, it is my clinical opinion that admission for OBSERVATION is reasonable and necessary because the patient's presenting complaints in the context of their chronic conditions represent sufficient risk of deterioration or significant morbidity to constitute reasonable grounds for close observation in the hospital setting, but that the patient may be medically stable for discharge from the hospital within 24 to 48 hours.    Medical decision making: Patient seen at 1:26 AM on 03/29/2016.  The patient was discussed with Will Dansie, PA-C.  What exists of the patient's chart was  reviewed in depth and summarized above.  Clinical condition: stable.        Edwin Dada [facility] Pager [phone]

## 2016-03-29 NOTE — Evaluation (Signed)
Physical Therapy Evaluation Patient Details Name: Toni Gutierrez MRN: [id number] DOB: 09/25/1960 Today's Date: 03/29/2016   History of Present Illness  pt presents after falls at home with L shoulder and L rib pain.  pt negative for fxs.  pt with HTN, Chronic Back Pain, and hx of Etoh.    Clinical Impression  Pt indicates feeling cautious with ambulation, but no deficits noted.  Pt able to ambulate in hallways while performing head turns, stops, and turns without the need for A.  Pt indicates feeling like she is at her baseline and denied a need for PT.  Pt ed on continuing to perform ROM to L shoulder to prevent limitations in the future.  Feel pt is safe for return to home at this time.  Will sign off.      Follow Up Recommendations No PT follow up;Supervision - Intermittent    Equipment Recommendations  None recommended by PT    Recommendations for Other Services       Precautions / Restrictions Precautions Precautions: Fall Restrictions Weight Bearing Restrictions: No      Mobility  Bed Mobility Overal bed mobility: Independent                Transfers Overall transfer level: Independent Equipment used: None                Ambulation/Gait Ambulation/Gait assistance: Modified independent (Device/Increase time) Ambulation Distance (Feet): 250 Feet Assistive device: None Gait Pattern/deviations: Step-through pattern;Decreased stride length     General Gait Details: pt moving slowly and indicates feeling cautious, but no deficits noted.  pt able to ambulate hallway, performing head turns, stops, and turns without the need for A.  pt denies dizziness throughout ambulation.    Stairs            Wheelchair Mobility    Modified Rankin (Stroke Patients Only)       Balance Overall balance assessment: History of Falls;Needs assistance Sitting-balance support: No upper extremity supported;Feet supported Sitting balance-Leahy Scale: Normal      Standing balance support: No upper extremity supported;During functional activity Standing balance-Leahy Scale: Good Standing balance comment: pt able to perform head tunrs during ambulation aong with stops and turns.                               Pertinent Vitals/Pain Pain Assessment: 0-10 Pain Score: 3  Pain Location: L shoulder and L ribs Pain Descriptors / Indicators: Grimacing;Discomfort Pain Intervention(s): Monitored during session;Premedicated before session;Repositioned    Home Living Family/patient expects to be discharged to:: Private residence Living Arrangements: Spouse/significant other Available Help at Discharge: Family;Available PRN/intermittently Type of Home:  (Townhome) Home Access: Stairs to enter Entrance Stairs-Rails: None Entrance Stairs-Number of Steps: 1 Home Layout: Two level;Bed/bath upstairs Home Equipment: Cane - single point Additional Comments: pt trying to move to a 1 level townhome.      Prior Function Level of Independence: Independent         Comments: pt's family drives for her.       Hand Dominance        Extremity/Trunk Assessment   Upper Extremity Assessment Upper Extremity Assessment: Overall WFL for tasks assessed    Lower Extremity Assessment Lower Extremity Assessment: Overall WFL for tasks assessed    Cervical / Trunk Assessment Cervical / Trunk Assessment: Normal  Communication   Communication: No difficulties  Cognition Arousal/Alertness: Awake/alert Behavior During Therapy: WFL for tasks  assessed/performed Overall Cognitive Status: Within Functional Limits for tasks assessed                      General Comments      Exercises     Assessment/Plan    PT Assessment Patent does not need any further PT services  PT Problem List            PT Treatment Interventions      PT Goals (Current goals can be found in the Care Plan section)  Acute Rehab PT Goals Patient Stated Goal: No  more falls PT Goal Formulation: All assessment and education complete, DC therapy    Frequency     Barriers to discharge        Co-evaluation               End of Session Equipment Utilized During Treatment: Gait belt Activity Tolerance: Patient tolerated treatment well Patient left: in bed;with call bell/phone within reach Nurse Communication: Mobility status    Functional Assessment Tool Used: Clinical Judgement Functional Limitation: Mobility: Walking and moving around Mobility: Walking and Moving Around Current Status [phone]): 0 percent impaired, limited or restricted Mobility: Walking and Moving Around Goal Status [phone]): 0 percent impaired, limited or restricted Mobility: Walking and Moving Around Discharge Status [phone]): 0 percent impaired, limited or restricted    Time: 1213-1230 PT Time Calculation (min) (ACUTE ONLY): 17 min   Charges:   PT Evaluation $PT Eval Low Complexity: 1 Procedure     PT G Codes:   PT G-Codes **NOT FOR INPATIENT CLASS** Functional Assessment Tool Used: Clinical Judgement Functional Limitation: Mobility: Walking and moving around Mobility: Walking and Moving Around Current Status [id number]): 0 percent impaired, limited or restricted Mobility: Walking and Moving Around Goal Status [id number]): 0 percent impaired, limited or restricted Mobility: Walking and Moving Around Discharge Status [id number]): 0 percent impaired, limited or restricted    Catarina Hartshorn, PT  [phone] 03/29/2016, 1:07 PM

## 2016-03-29 NOTE — ED Notes (Signed)
Report called and given to [facility]

## 2016-03-29 NOTE — Discharge Summary (Addendum)
Physician Discharge Summary   Patient ID: Toni Gutierrez MRN: [id number] DOB/AGE: February 09, 1961 56 y.o.  Admit date: 03/28/2016 Discharge date: 03/29/2016  Primary Care Physician:  Ricke Hey, MD  Discharge Diagnoses:    .Severe Hypokalemia   Hyponatremia . PAIN, CHRONIC NEC . Acute pain of left shoulder . Essential hypertension   Mechanical fall   Consults:  None  Recommendations for Outpatient Follow-up:   1. Please repeat CBC/BMET at next visit   DIET heart healthy diet    Allergies:  No Known Allergies   DISCHARGE MEDICATIONS: Current Discharge Medication List    START taking these medications   Details  acetaminophen (TYLENOL) 325 MG tablet Take 2 tablets (650 mg total) by mouth every 6 (six) hours as needed for mild pain or moderate pain. Over the counter Qty: 30 tablet, Refills: 3    amLODipine (NORVASC) 5 MG tablet Take 1 tablet (5 mg total) by mouth daily. Qty: 30 tablet, Refills: 3    lisinopril (PRINIVIL,ZESTRIL) 20 MG tablet Take 1 tablet (20 mg total) by mouth daily. Qty: 30 tablet, Refills: 3    naproxen (NAPROSYN) 250 MG tablet Take 1 tablet (250 mg total) by mouth 2 (two) times daily with a meal. X 5 days Qty: 10 tablet, Refills: 0    traMADol (ULTRAM) 50 MG tablet Take 1 tablet (50 mg total) by mouth every 6 (six) hours as needed for severe pain. Qty: 30 tablet, Refills: 0      CONTINUE these medications which have CHANGED   Details  omeprazole (PRILOSEC) 20 MG capsule Take 2 capsules (40 mg total) by mouth daily. Qty: 60 capsule, Refills: 0   Associated Diagnoses: Gastroesophageal reflux disease without esophagitis      CONTINUE these medications which have NOT CHANGED   Details  cetirizine (ZYRTEC) 10 MG tablet Take 1 tablet (10 mg total) by mouth daily. Qty: 30 tablet, Refills: 11   Associated Diagnoses: Sore throat; Allergic rhinitis, unspecified allergic rhinitis type         Brief H and P: For complete details please  refer to admission H and P, but in brief patient is a 56 year old female with hypertension, chronic low back pain and chronic shoulder pain presented with mechanical fall. Patient reported that she was in her usual state of health until the last couple weeks and she fell several times. The first time happened in the bathroom at church, most recent time happened last night prior to admission on the stairs leading to her daughter's house. Per patient she did not lose consciousness, denied any dizziness, lightheadedness nausea chest pain, diaphoresis or any palpitations or any vertigo symptoms. She felt that her legs just gave out. Of note, the patient saw her PCP a few days ago and was noted to be hypertensive 200s/100 in the office and was prescribed clonidine which she did not take, had her lisinopril HCTZ dose doubled. Potassium at that time was 3.3. In ED, patient was noted to have BP 100/65, sodium 130, potassium 2.7, creatinine 0.8, hemoglobin 9.7. Dedicated rib films and left shoulder x-rays were all negative for fractures. Patient was admitted for further workup.  Hospital Course:   Severe Hypokalemia:  potassium 2.7 at the time of admission No recent diarrhea.  Normal magnesium.  No alcohol use. Likely due to recent increase in HCTZ.   - Recommended to stop HCTZ Patient was given supplemental potassium Potassium 4.1 at the time of discharge.   Hyponatremia:  Sodium 1:30 at the time of admission  in the setting of recent HCTZ increase. Again recommended to stop hydrochlorothiazide Sodium 134 at the time of discharge   Fall and shoulder and chest pain:  She has acute on chronic pain in her left shoulder and chest from the fall, worse with palpation.  Low suspicion for angina.  There was no loss of consciousness. Orthostatic vitals were negative. No syncopal episode or any neurological deficits. No vertigo symptoms. -Naproxen for 5 days with PPI - Dedicated rib x-ray and shoulder/humerus  x-ray negative for any fracture or dislocation - Patient has a sling at home, recommended for comfort. Patient was also given prescription for tramadol. - Patient was seen by PT and she did not need any PT follow up   Hypertension:  -Continue lisinopril . Patient was given prescription for amlodipine 5 mg daily - She was recommended to stop HCTZ  Day of Discharge BP 122/76 (BP Location: Right Arm)   Pulse 86   Temp 97.5 F (36.4 C)   Resp 20   Ht 4\' 11"  (1.499 m)   Wt 58.2 kg (128 lb 4.9 oz)   LMP 12/25/2010   SpO2 98%   BMI 25.91 kg/m   Physical Exam: General: Alert and awake oriented x3 not in any acute distress. HEENT: anicteric sclera, pupils reactive to light and accommodation CVS: S1-S2 clear no murmur rubs or gallops Chest: clear to auscultation bilaterally, no wheezing rales or rhonchi Abdomen: soft nontender, nondistended, normal bowel sounds Extremities: no cyanosis, clubbing or edema noted bilaterally Neuro: Cranial nerves II-XII intact, no focal neurological deficits   The results of significant diagnostics from this hospitalization (including imaging, microbiology, ancillary and laboratory) are listed below for reference.    LAB RESULTS: Basic Metabolic Panel:  Recent Labs Lab 03/28/16 2140 03/29/16 0307  NA 130* 134*  K 2.7* 4.1  CL 93* 100*  CO2 24 26  GLUCOSE 94 89  BUN 5* <5*  CREATININE 0.88 0.84  CALCIUM 8.1* 8.5*  MG 1.8  --    Liver Function Tests:  Recent Labs Lab 03/25/16 1047  AST 24  ALT 14  ALKPHOS 78  BILITOT 0.5  PROT 7.2  ALBUMIN 3.4*   No results for input(s): LIPASE, AMYLASE in the last 168 hours. No results for input(s): AMMONIA in the last 168 hours. CBC:  Recent Labs Lab 03/25/16 1047 03/28/16 2140  WBC 4.4 4.5  NEUTROABS 2,728 1.9  HGB 11.1* 9.7*  HCT 33.5* 28.6*  MCV 93.3 92.6  PLT 280 212   Cardiac Enzymes: No results for input(s): CKTOTAL, CKMB, CKMBINDEX, TROPONINI in the last 168  hours. BNP: Invalid input(s): POCBNP CBG: No results for input(s): GLUCAP in the last 168 hours.  Significant Diagnostic Studies:  Dg Ribs Unilateral W/chest Left  Result Date: 03/28/2016 CLINICAL DATA:  Pt says she fell multiple times throughout this past week randomly; pt denies lightheadness and vertigo prior to fall. Pt fell at church on Sunday, going up the stairs at home, and at daughter's house yesterday. EXAM: LEFT RIBS AND CHEST - 3+ VIEW COMPARISON:  Chest x-ray dated 01/23/2009. FINDINGS: Heart size is upper normal. Now appreciated is a large hiatal hernia. Lungs are clear. No pleural effusion or pneumothorax seen. Osseous structures about the chest are unremarkable. No left-sided rib fracture or displacement seen. IMPRESSION: 1. Large hiatal hernia. 2. No acute cardiopulmonary disease. 3. No left-sided rib fracture or displacement seen. Electronically Signed   By: Franki Cabot M.D.   On: 03/28/2016 21:08   Dg Humerus Left  Result Date: 03/28/2016 CLINICAL DATA:  Multiple falls this week. EXAM: LEFT HUMERUS - 2+ VIEW COMPARISON:  None. FINDINGS: There is no evidence of fracture or other focal bone lesions. Soft tissues are unremarkable. IMPRESSION: Negative. Electronically Signed   By: Franki Cabot M.D.   On: 03/28/2016 21:09    2D ECHO:   Disposition and Follow-up: Discharge Instructions    Diet - low sodium heart healthy    Complete by:  As directed    Diet general    Complete by:  As directed    Discharge instructions    Complete by:  As directed    Please take naproxen only with meals and take omeprazole or any generic PPI with it.   You can use your sling for left shoulder for comfort for a 1-2 weeks.   Increase activity slowly    Complete by:  As directed    Increase activity slowly    Complete by:  As directed        DISPOSITION:Home   DISCHARGE FOLLOW-UP Follow-up Information    Ricke Hey, MD. Schedule an appointment as soon as possible for a  visit in 1 week(s).   Specialty:  Family Medicine Contact information: [place] [id number] [phone]            Time spent on Discharge: 25 mins   Signed:   Arpan Eskelson M.D. [facility] 03/29/2016, 2:35 PM Pager: [phone]

## 2016-03-29 NOTE — Progress Notes (Signed)
Orthostatic vital signs:  Lying       BP: 149/84  HR: 81 Sitting      BP: 144/73  HR: 79 Standing  BP: 152/92  HR: 90

## 2016-03-29 NOTE — Progress Notes (Signed)
Received report from ED RN, Lovena Le.

## 2016-03-29 NOTE — Progress Notes (Signed)
NURSING PROGRESS NOTE  Toni Gutierrez [id number] Admission Data: 03/29/2016 3:29 AM Attending Provider: No att. providers found [id number], Gwynneth Munson, MD Code Status: Full  Allergies:  Patient has no known allergies. Past Medical History:   has a past medical history of Alcohol abuse; Allergic rhinitis; Arthritis; Carpal tunnel syndrome, right; Chronic headache; Chronic lower back pain; Heartburn; Hyperlipidemia; Hypertension; Left knee pain; and Shoulder pain, bilateral. Past Surgical History:   has a past surgical history that includes Tubal ligation (1989); Anterior cruciate ligament repair; and Rotator cuff repair. Social History:   reports that she has never smoked. She has never used smokeless tobacco. She reports that she does not drink alcohol or use drugs.  Toni Gutierrez is a 56 y.o. female patient admitted from ED:   Last Documented Vital Signs: Blood pressure (!) 152/92, pulse 90, temperature 98.2 F (36.8 C), temperature source Oral, resp. rate 18, height 4\' 11"  (1.499 m), weight 58.2 kg (128 lb 4.9 oz), last menstrual period 12/25/2010, SpO2 97 %.  Cardiac Monitoring: Box # 35 in place. Cardiac monitor yields:normal sinus rhythm.  IV Fluids:  IV in place, occlusive dsg intact without redness, IV cath forearm right, condition patent and no redness none.   Skin: Appropriate for ethnicity and intact.  Patient orientated to room. Admission inpatient armband information verified with patient to include name and date of birth and placed on patient arm. Side rails up x 2, fall assessment and education completed with patient. Patient able to verbalize understanding of risk associated with falls and verbalized understanding to call for assistance before getting out of bed. Call light within reach. Patient able to voice and demonstrate understanding of unit orientation instructions.    Will continue to evaluate and treat per MD orders.  Clydell Hakim RN, BSN

## 2016-03-29 NOTE — Progress Notes (Signed)
Nadean Corwin to be D/C'd Home per MD order.  Discussed with the patient and all questions fully answered.  VSS, Skin clean, dry and intact without evidence of skin break down, no evidence of skin tears noted. IV catheter discontinued intact. Site without signs and symptoms of complications. Dressing and pressure applied.  An After Visit Summary was printed and given to the patient. Patient received prescription.  D/c education completed with patient/family including follow up instructions, medication list, d/c activities limitations if indicated, with other d/c instructions as indicated by MD - patient able to verbalize understanding, all questions fully answered.   Patient instructed to return to ED, call 911, or call MD for any changes in condition.   Patient escorted via [place], and D/C home via private auto.  [place] 03/29/2016 3:08 PM

## 2016-06-15 ENCOUNTER — Other Ambulatory Visit: Payer: Self-pay | Admitting: Family Medicine

## 2016-06-15 DIAGNOSIS — K219 Gastro-esophageal reflux disease without esophagitis: Secondary | ICD-10-CM

## 2016-10-10 ENCOUNTER — Emergency Department (HOSPITAL_COMMUNITY): Payer: Medicare Other

## 2016-10-10 ENCOUNTER — Encounter (HOSPITAL_COMMUNITY): Payer: Self-pay | Admitting: *Deleted

## 2016-10-10 ENCOUNTER — Emergency Department (HOSPITAL_COMMUNITY)
Admission: EM | Admit: 2016-10-10 | Discharge: 2016-10-11 | Disposition: A | Discharge status: Home / Self Care | Payer: Medicare Other | Attending: Emergency Medicine | Admitting: Emergency Medicine

## 2016-10-10 DIAGNOSIS — R609 Edema, unspecified: Secondary | ICD-10-CM | POA: Diagnosis not present

## 2016-10-10 DIAGNOSIS — Z79899 Other long term (current) drug therapy: Secondary | ICD-10-CM | POA: Insufficient documentation

## 2016-10-10 DIAGNOSIS — R2243 Localized swelling, mass and lump, lower limb, bilateral: Secondary | ICD-10-CM | POA: Diagnosis present

## 2016-10-10 DIAGNOSIS — E785 Hyperlipidemia, unspecified: Secondary | ICD-10-CM | POA: Diagnosis not present

## 2016-10-10 DIAGNOSIS — I10 Essential (primary) hypertension: Secondary | ICD-10-CM | POA: Diagnosis not present

## 2016-10-10 LAB — BASIC METABOLIC PANEL
Anion gap: 10 (ref 5–15)
CALCIUM: 8.1 mg/dL — AB (ref 8.9–10.3)
CHLORIDE: 101 mmol/L (ref 101–111)
CO2: 22 mmol/L (ref 22–32)
CREATININE: 0.69 mg/dL (ref 0.44–1.00)
Glucose, Bld: 104 mg/dL — ABNORMAL HIGH (ref 65–99)
Potassium: 3 mmol/L — ABNORMAL LOW (ref 3.5–5.1)
SODIUM: 133 mmol/L — AB (ref 135–145)

## 2016-10-10 LAB — CBC
HCT: 28 % — ABNORMAL LOW (ref 36.0–46.0)
Hemoglobin: 9.3 g/dL — ABNORMAL LOW (ref 12.0–15.0)
MCH: 31.3 pg (ref 26.0–34.0)
MCHC: 33.2 g/dL (ref 30.0–36.0)
MCV: 94.3 fL (ref 78.0–100.0)
PLATELETS: 275 10*3/uL (ref 150–400)
RBC: 2.97 MIL/uL — ABNORMAL LOW (ref 3.87–5.11)
RDW: 17.8 % — AB (ref 11.5–15.5)
WBC: 4.8 10*3/uL (ref 4.0–10.5)

## 2016-10-10 LAB — I-STAT TROPONIN, ED: TROPONIN I, POC: 0 ng/mL (ref 0.00–0.08)

## 2016-10-10 NOTE — ED Triage Notes (Signed)
Pt c/o bilateral edema to legs for over a month. Also reports intermittent chest tightness with R arm pain. Pt's reports her daughter bought her compression hose which she has been wearing, has been having increased pain and swelling to legs.

## 2016-10-11 DIAGNOSIS — R609 Edema, unspecified: Secondary | ICD-10-CM | POA: Diagnosis not present

## 2016-10-11 LAB — BRAIN NATRIURETIC PEPTIDE: B NATRIURETIC PEPTIDE 5: 37 pg/mL (ref 0.0–100.0)

## 2016-10-11 LAB — HEPATIC FUNCTION PANEL
ALT: 22 U/L (ref 14–54)
AST: 48 U/L — ABNORMAL HIGH (ref 15–41)
Albumin: 2.9 g/dL — ABNORMAL LOW (ref 3.5–5.0)
Alkaline Phosphatase: 89 U/L (ref 38–126)
Total Bilirubin: 0.6 mg/dL (ref 0.3–1.2)
Total Protein: 6.6 g/dL (ref 6.5–8.1)

## 2016-10-11 MED ORDER — POTASSIUM CHLORIDE CRYS ER 20 MEQ PO TBCR
40.0000 meq | EXTENDED_RELEASE_TABLET | Freq: Once | ORAL | Status: AC
  Administered 2016-10-11: 40 meq via ORAL
  Filled 2016-10-11: qty 2

## 2016-10-11 NOTE — ED Provider Notes (Signed)
[facility] DEPT Provider Note   CSN: [id number] Arrival date & time: 10/10/16  2012     History   Chief Complaint Chief Complaint  Patient presents with  . Leg Swelling    HPI Toni Gutierrez is a 56 y.o. female.  The history is provided by the patient and medical records. No language interpreter was used.   Toni Gutierrez is a 56 y.o. female  with a PMH of HTN, HLD who presents to the Emergency Department complaining of progressively worsening bilateral lower extremity swelling x 2-3 weeks. Denies chest pain. Denies shortness of breath at rest or with deep breathing, but states that she will have some shortness of breath with exertion. She has 15 steps in her home which she typically is able to get up without stopping. Over the last week, she has noticed that it takes much more effort to get up the steps and will occasionally feel winded after doing so. She has a nurse who comes to visit once a week who evaluated swelling and told her to get compression stockings. Her daughter brought her TED hose which she has been using with some relief. She had a primary care appointment on 8/09 where she was seen for the same. She states that he checked her legs but she didn't really tell her why this was happening. Today, her legs became more painful than usual, prompting her to come to ER. Hx of arthritis and chronic pain and also complaining of right shoulder pain which is unchanged from her typical pain. No neck pain, numbness, tingling, weakness. Again no chest pain. No abdominal pain, n/v, fever or chills.   Past Medical History:  Diagnosis Date  . Alcohol abuse    No alcohol intake since 2001  . Allergic rhinitis   . Arthritis   . Carpal tunnel syndrome, right   . Chronic headache    Thought to be 2/2 cervical DJD vs anxiety. CT head in 2010 without any significant findings  . Chronic lower back pain    MRI of cervical, lumbar spine (all obtained by Dr. Ronnie Derby in 2006)- showing mild  DDD with bulges but without disc herniation, at multiple levels; mild facet joint dz, primarily at L3-4 and L4-5 bilaterally  . Heartburn   . Hyperlipidemia   . Hypertension   . Left knee pain    s/p ACL reconstruction by Dr. Ronnie Derby  . Shoulder pain, bilateral    MRI right shoulder showing tear of infraspinatus tendon with bursal surface fraying of the infraspinatus    Patient Active Problem List   Diagnosis Date Noted  . Chronic prescription opiate use 03/25/2016  . Essential hypertension 07/29/2015  . Rhinitis, allergic 07/29/2015  . GERD (gastroesophageal reflux disease) 11/17/2012  . Chronic back pain 11/17/2012  . Dental caries 11/17/2012  . Sciatica 11/17/2012  . Hypokalemia 08/16/2012  . IT band syndrome 11/16/2011  . Right leg pain 08/31/2011  . Mononeuropathy of left upper extremity 07/15/2010  . Preventative health care 03/27/2010  . Acute pain of left shoulder 03/02/2007  . PAIN, CHRONIC NEC 04/27/2006  . HYPERLIPIDEMIA 03/02/2006  . ALCOHOL ABUSE 11/20/2005  . CARPAL TUNNEL SYNDROME, RIGHT 11/20/2005  . ALLERGIC RHINITIS 11/20/2005    Past Surgical History:  Procedure Laterality Date  . ANTERIOR CRUCIATE LIGAMENT REPAIR     By Dr. Ronnie Derby  . ROTATOR CUFF REPAIR     Right shoulder  . TUBAL LIGATION  1989    OB History    No data  available       Home Medications    Prior to Admission medications   Medication Sig Start Date End Date Taking? Authorizing Provider  acetaminophen (TYLENOL) 325 MG tablet Take 2 tablets (650 mg total) by mouth every 6 (six) hours as needed for mild pain or moderate pain. Over the counter 03/29/16   Rai, Ripudeep K, MD  amLODipine (NORVASC) 5 MG tablet Take 1 tablet (5 mg total) by mouth daily. 03/29/16   Rai, Vernelle Emerald, MD  cetirizine (ZYRTEC) 10 MG tablet Take 1 tablet (10 mg total) by mouth daily. 10/09/15   Micheline Chapman, NP  lisinopril (PRINIVIL,ZESTRIL) 20 MG tablet Take 1 tablet (20 mg total) by mouth daily. 03/29/16   Rai,  Vernelle Emerald, MD  naproxen (NAPROSYN) 250 MG tablet Take 1 tablet (250 mg total) by mouth 2 (two) times daily with a meal. X 5 days 03/29/16   Rai, Ripudeep K, MD  omeprazole (PRILOSEC) 20 MG capsule Take 2 capsules (40 mg total) by mouth daily. 03/29/16   Rai, Vernelle Emerald, MD  traMADol (ULTRAM) 50 MG tablet Take 1 tablet (50 mg total) by mouth every 6 (six) hours as needed for severe pain. 03/29/16   Mendel Corning, MD    Family History Family History  Problem Relation Age of Onset  . Stroke Mother     Social History Social History  Substance Use Topics  . Smoking status: Never Smoker  . Smokeless tobacco: Never Used  . Alcohol use No     Comment: History of abuse     Allergies   Patient has no known allergies.   Review of Systems Review of Systems  Respiratory: Positive for shortness of breath. Negative for cough and wheezing.   Cardiovascular: Positive for leg swelling. Negative for chest pain and palpitations.  All other systems reviewed and are negative.    Physical Exam Updated Vital Signs BP 112/77   Pulse 73   Temp 98 F (36.7 C) (Oral)   Resp 18   LMP 12/25/2010   SpO2 100%   Physical Exam  Constitutional: She is oriented to person, place, and time. She appears well-developed and well-nourished. No distress.  HENT:  Head: Normocephalic and atraumatic.  Cardiovascular: Normal rate, regular rhythm and normal heart sounds.   No murmur heard. Pulmonary/Chest: Effort normal and breath sounds normal. No respiratory distress. She has no wheezes. She has no rales.  Abdominal: Soft. She exhibits no distension. There is no tenderness.  Musculoskeletal: She exhibits edema.  Bilateral lower extremity edema which is symmetric. No calf tenderness. No erythema/warmth. No open wounds.  Neurological: She is alert and oriented to person, place, and time.  Skin: Skin is warm and dry.  Nursing note and vitals reviewed.    ED Treatments / Results  Labs (all labs ordered are  listed, but only abnormal results are displayed) Labs Reviewed  BASIC METABOLIC PANEL - Abnormal; Notable for the following:       Result Value   Sodium 133 (*)    Potassium 3.0 (*)    Glucose, Bld 104 (*)    BUN <5 (*)    Calcium 8.1 (*)    All other components within normal limits  CBC - Abnormal; Notable for the following:    RBC 2.97 (*)    Hemoglobin 9.3 (*)    HCT 28.0 (*)    RDW 17.8 (*)    All other components within normal limits  HEPATIC FUNCTION PANEL - Abnormal; Notable  for the following:    Albumin 2.9 (*)    AST 48 (*)    Bilirubin, Direct <0.1 (*)    All other components within normal limits  BRAIN NATRIURETIC PEPTIDE  I-STAT TROPONIN, ED    EKG  EKG Interpretation  Date/Time:  Saturday October 10 2016 20:25:39 EDT Ventricular Rate:  79 PR Interval:  166 QRS Duration: 58 QT Interval:  428 QTC Calculation: 490 R Axis:   68 Text Interpretation:  Normal sinus rhythm Septal infarct , age undetermined Abnormal ECG T wave abnormalities in diffuse leads have resolved Confirmed by Pryor Curia [phone]) on 10/11/2016 12:15:20 AM       Radiology Dg Chest 2 View  Result Date: 10/10/2016 CLINICAL DATA:  Lower extremity edema.  Intermittent chest tightness EXAM: CHEST  2 VIEW COMPARISON:  March 28, 2016 FINDINGS: There is scarring in the left base. Lungs elsewhere are clear. Heart size and pulmonary vascularity are normal. There is a sizable hiatal hernia. No adenopathy. No bone lesions. IMPRESSION: Sizable hiatal hernia. Scarring left base. No edema or consolidation. Stable cardiac silhouette. Electronically Signed   By: Lowella Grip III M.D.   On: 10/10/2016 21:25    Procedures Procedures (including critical care time)  Medications Ordered in ED Medications  potassium chloride SA (K-DUR,KLOR-CON) CR tablet 40 mEq (40 mEq Oral Given 10/11/16 0110)     Initial Impression / Assessment and Plan / ED Course  I have reviewed the triage vital signs and the  nursing notes.  Pertinent labs & imaging results that were available during my care of the patient were reviewed by me and considered in my medical decision making (see chart for details).    Toni Gutierrez is a 56 y.o. female who presents to ED for 2-3 weeks of progressively worsening lower extremity swelling. Pitting edema on exam which is symmetric. No calf tenderness. Doubt DVT. Labs reviewed. Hypokalemic which was replenished in ED. BNP negative. Evaluation does not show pathology that would require ongoing emergent intervention or inpatient treatment. Discussed return precautions at length with patient and family at bedside. PCP follow up strongly encouraged. Discussed decreasing sodium intake, elevating extremities. All questions answered.   Patient discussed with Dr. Leonides Schanz who agrees with treatment plan.    Final Clinical Impressions(s) / ED Diagnoses   Final diagnoses:  Peripheral edema    New Prescriptions Discharge Medication List as of 10/11/2016  2:41 AM       Illona Bulman, Ozella Almond, PA-C 10/11/16 [place], Delice Bison, DO 10/11/16 [phone]

## 2016-10-11 NOTE — ED Notes (Signed)
Pt understood dc material. NAD noted. 

## 2016-10-11 NOTE — Discharge Instructions (Signed)
It was my pleasure taking care of you today!   Decrease salt (sodium) intake, continue wearing compression hose, elevate legs throughout the day. This will all help with swelling.   Call your primary care office Monday morning to schedule a follow up appointment.   Return to ER for chest pain, worsening shortness of breath, new or worsening symptoms, any additional concerns.

## 2017-08-10 ENCOUNTER — Other Ambulatory Visit: Payer: Self-pay | Admitting: Physician Assistant

## 2017-08-10 DIAGNOSIS — Z1231 Encounter for screening mammogram for malignant neoplasm of breast: Secondary | ICD-10-CM

## 2017-09-10 ENCOUNTER — Ambulatory Visit: Payer: Medicare Other

## 2017-12-30 ENCOUNTER — Encounter: Payer: Self-pay | Admitting: Gastroenterology

## 2018-01-06 ENCOUNTER — Emergency Department (HOSPITAL_COMMUNITY): Payer: Medicare Other

## 2018-01-06 ENCOUNTER — Other Ambulatory Visit: Payer: Self-pay

## 2018-01-06 ENCOUNTER — Encounter (HOSPITAL_COMMUNITY): Payer: Self-pay

## 2018-01-06 ENCOUNTER — Inpatient Hospital Stay (HOSPITAL_COMMUNITY)
Admission: EM | Admit: 2018-01-06 | Discharge: 2018-01-07 | DRG: 917 | Disposition: A | Discharge status: Home / Self Care | Payer: Medicare Other | Attending: Family Medicine | Admitting: Family Medicine

## 2018-01-06 DIAGNOSIS — F1010 Alcohol abuse, uncomplicated: Secondary | ICD-10-CM

## 2018-01-06 DIAGNOSIS — Z791 Long term (current) use of non-steroidal anti-inflammatories (NSAID): Secondary | ICD-10-CM

## 2018-01-06 DIAGNOSIS — K449 Diaphragmatic hernia without obstruction or gangrene: Secondary | ICD-10-CM | POA: Diagnosis present

## 2018-01-06 DIAGNOSIS — R401 Stupor: Secondary | ICD-10-CM

## 2018-01-06 DIAGNOSIS — G9340 Encephalopathy, unspecified: Secondary | ICD-10-CM

## 2018-01-06 DIAGNOSIS — M47812 Spondylosis without myelopathy or radiculopathy, cervical region: Secondary | ICD-10-CM | POA: Diagnosis present

## 2018-01-06 DIAGNOSIS — I161 Hypertensive emergency: Secondary | ICD-10-CM | POA: Diagnosis present

## 2018-01-06 DIAGNOSIS — I6522 Occlusion and stenosis of left carotid artery: Secondary | ICD-10-CM | POA: Diagnosis present

## 2018-01-06 DIAGNOSIS — G92 Toxic encephalopathy: Secondary | ICD-10-CM | POA: Diagnosis present

## 2018-01-06 DIAGNOSIS — Z79899 Other long term (current) drug therapy: Secondary | ICD-10-CM

## 2018-01-06 DIAGNOSIS — M545 Low back pain: Secondary | ICD-10-CM | POA: Diagnosis present

## 2018-01-06 DIAGNOSIS — E785 Hyperlipidemia, unspecified: Secondary | ICD-10-CM | POA: Diagnosis not present

## 2018-01-06 DIAGNOSIS — Y900 Blood alcohol level of less than 20 mg/100 ml: Secondary | ICD-10-CM | POA: Diagnosis present

## 2018-01-06 DIAGNOSIS — E876 Hypokalemia: Secondary | ICD-10-CM

## 2018-01-06 DIAGNOSIS — Z79891 Long term (current) use of opiate analgesic: Secondary | ICD-10-CM

## 2018-01-06 DIAGNOSIS — I10 Essential (primary) hypertension: Secondary | ICD-10-CM

## 2018-01-06 DIAGNOSIS — I700 Atherosclerosis of aorta: Secondary | ICD-10-CM | POA: Diagnosis not present

## 2018-01-06 DIAGNOSIS — T402X1A Poisoning by other opioids, accidental (unintentional), initial encounter: Principal | ICD-10-CM | POA: Diagnosis present

## 2018-01-06 DIAGNOSIS — R4182 Altered mental status, unspecified: Secondary | ICD-10-CM | POA: Diagnosis present

## 2018-01-06 DIAGNOSIS — F10129 Alcohol abuse with intoxication, unspecified: Secondary | ICD-10-CM | POA: Diagnosis not present

## 2018-01-06 DIAGNOSIS — Z823 Family history of stroke: Secondary | ICD-10-CM | POA: Diagnosis not present

## 2018-01-06 DIAGNOSIS — G894 Chronic pain syndrome: Secondary | ICD-10-CM | POA: Diagnosis present

## 2018-01-06 LAB — URINALYSIS, COMPLETE (UACMP) WITH MICROSCOPIC
BACTERIA UA: NONE SEEN
Bilirubin Urine: NEGATIVE
Glucose, UA: NEGATIVE mg/dL
Hgb urine dipstick: NEGATIVE
Ketones, ur: NEGATIVE mg/dL
Leukocytes, UA: NEGATIVE
Nitrite: NEGATIVE
PH: 6 (ref 5.0–8.0)
PROTEIN: NEGATIVE mg/dL
Specific Gravity, Urine: 1.003 — ABNORMAL LOW (ref 1.005–1.030)

## 2018-01-06 LAB — COMPREHENSIVE METABOLIC PANEL
ALBUMIN: 3.3 g/dL — AB (ref 3.5–5.0)
ALK PHOS: 64 U/L (ref 38–126)
ALT: 52 U/L — AB (ref 0–44)
AST: 54 U/L — ABNORMAL HIGH (ref 15–41)
Anion gap: 9 (ref 5–15)
BILIRUBIN TOTAL: 0.7 mg/dL (ref 0.3–1.2)
CALCIUM: 8.6 mg/dL — AB (ref 8.9–10.3)
CO2: 26 mmol/L (ref 22–32)
CREATININE: 0.91 mg/dL (ref 0.44–1.00)
Chloride: 102 mmol/L (ref 98–111)
GFR calc Af Amer: >60 mL/min (ref >60)
GFR calc non Af Amer: >60 mL/min (ref >60)
GLUCOSE: 81 mg/dL (ref 70–99)
Potassium: 2.8 mmol/L — ABNORMAL LOW (ref 3.5–5.1)
SODIUM: 137 mmol/L (ref 135–145)
TOTAL PROTEIN: 6.2 g/dL — AB (ref 6.5–8.1)

## 2018-01-06 LAB — I-STAT VENOUS BLOOD GAS, ED
Acid-Base Excess: 3 mmol/L — ABNORMAL HIGH (ref 0.0–2.0)
Bicarbonate: 30.7 mmol/L — ABNORMAL HIGH (ref 20.0–28.0)
O2 Saturation: 55 %
PCO2 VEN: 56.7 mmHg (ref 44.0–60.0)
PO2 VEN: 31 mmHg — AB (ref 32.0–45.0)
TCO2: 32 mmol/L (ref 22–32)
pH, Ven: 7.342 (ref 7.250–7.430)

## 2018-01-06 LAB — CBC WITH DIFFERENTIAL/PLATELET
Abs Immature Granulocytes: 0.01 10*3/uL (ref 0.00–0.07)
Basophils Absolute: 0 10*3/uL (ref 0.0–0.1)
Basophils Relative: 1 %
EOS ABS: 0.1 10*3/uL (ref 0.0–0.5)
EOS PCT: 3 %
HEMATOCRIT: 39.7 % (ref 36.0–46.0)
Hemoglobin: 13.7 g/dL (ref 12.0–15.0)
Immature Granulocytes: 0 %
LYMPHS ABS: 1.4 10*3/uL (ref 0.7–4.0)
Lymphocytes Relative: 35 %
MCH: 36 pg — AB (ref 26.0–34.0)
MCHC: 34.5 g/dL (ref 30.0–36.0)
MCV: 104.2 fL — AB (ref 80.0–100.0)
MONO ABS: 0.5 10*3/uL (ref 0.1–1.0)
MONOS PCT: 13 %
Neutro Abs: 1.9 10*3/uL (ref 1.7–7.7)
Neutrophils Relative %: 48 %
Platelets: 221 10*3/uL (ref 150–400)
RBC: 3.81 MIL/uL — ABNORMAL LOW (ref 3.87–5.11)
RDW: 11.5 % (ref 11.5–15.5)
WBC: 4.1 10*3/uL (ref 4.0–10.5)
nRBC: 0 % (ref 0.0–0.2)

## 2018-01-06 LAB — LACTIC ACID, PLASMA
Lactic Acid, Venous: 3.3 mmol/L (ref 0.5–1.9)
Lactic Acid, Venous: 2.9 mmol/L (ref 0.5–1.9)

## 2018-01-06 LAB — ETHANOL: ALCOHOL ETHYL (B): 50 mg/dL — AB (ref <10)

## 2018-01-06 LAB — I-STAT TROPONIN, ED: Troponin i, poc: 0 ng/mL (ref 0.00–0.08)

## 2018-01-06 LAB — RAPID URINE DRUG SCREEN, HOSP PERFORMED
Amphetamines: NOT DETECTED
BARBITURATES: NOT DETECTED
Benzodiazepines: NOT DETECTED
COCAINE: NOT DETECTED
Opiates: POSITIVE — AB
Tetrahydrocannabinol: NOT DETECTED

## 2018-01-06 LAB — BASIC METABOLIC PANEL
ANION GAP: 9 (ref 5–15)
BUN: <5 mg/dL — ABNORMAL LOW (ref 6–20)
CHLORIDE: 109 mmol/L (ref 98–111)
CO2: 25 mmol/L (ref 22–32)
Calcium: 9.1 mg/dL (ref 8.9–10.3)
Creatinine, Ser: 0.69 mg/dL (ref 0.44–1.00)
GFR calc non Af Amer: >60 mL/min (ref >60)
Glucose, Bld: 74 mg/dL (ref 70–99)
POTASSIUM: 3.3 mmol/L — AB (ref 3.5–5.1)
Sodium: 143 mmol/L (ref 135–145)

## 2018-01-06 LAB — LIPASE, BLOOD: LIPASE: 27 U/L (ref 11–51)

## 2018-01-06 LAB — AMMONIA: Ammonia: 31 umol/L (ref 9–35)

## 2018-01-06 LAB — CBG MONITORING, ED: Glucose-Capillary: 74 mg/dL (ref 70–99)

## 2018-01-06 LAB — MRSA PCR SCREENING: MRSA by PCR: NEGATIVE

## 2018-01-06 MED ORDER — POTASSIUM CHLORIDE 10 MEQ/100ML IV SOLN
10.0000 meq | INTRAVENOUS | Status: AC
  Administered 2018-01-06 (×5): 10 meq via INTRAVENOUS
  Filled 2018-01-06 (×5): qty 100

## 2018-01-06 MED ORDER — ACETAMINOPHEN 650 MG RE SUPP: 650.0000 mg | Freq: Four times a day (QID) | RECTAL | Status: DC | PRN

## 2018-01-06 MED ORDER — ENOXAPARIN SODIUM 40 MG/0.4ML ~~LOC~~ SOLN: 40.0000 mg | SUBCUTANEOUS | Status: DC

## 2018-01-06 MED ORDER — ACETAMINOPHEN 325 MG PO TABS: 650.0000 mg | ORAL_TABLET | Freq: Four times a day (QID) | ORAL | Status: DC | PRN

## 2018-01-06 MED ORDER — LABETALOL HCL 5 MG/ML IV SOLN
10.0000 mg | Freq: Once | INTRAVENOUS | Status: DC
  Filled 2018-01-06: qty 4

## 2018-01-06 MED ORDER — HYDRALAZINE HCL 20 MG/ML IJ SOLN
5.0000 mg | INTRAMUSCULAR | Status: DC | PRN
  Administered 2018-01-07: 5 mg via INTRAVENOUS
  Filled 2018-01-06 (×2): qty 1

## 2018-01-06 MED ORDER — NALOXONE HCL 2 MG/2ML IJ SOSY
1.0000 mg | PREFILLED_SYRINGE | Freq: Once | INTRAMUSCULAR | Status: DC
  Filled 2018-01-06: qty 2

## 2018-01-06 MED ORDER — THIAMINE HCL 100 MG/ML IJ SOLN
500.0000 mg | Freq: Three times a day (TID) | INTRAVENOUS | Status: DC
  Administered 2018-01-06 – 2018-01-07 (×4): 500 mg via INTRAVENOUS
  Filled 2018-01-06 (×5): qty 5

## 2018-01-06 MED ORDER — IOPAMIDOL (ISOVUE-370) INJECTION 76%
125.0000 mL | Freq: Once | INTRAVENOUS | Status: AC | PRN
  Administered 2018-01-06: 125 mL via INTRAVENOUS

## 2018-01-06 MED ORDER — NALOXONE HCL 0.4 MG/ML IJ SOLN
0.2000 mg | Freq: Once | INTRAMUSCULAR | Status: AC
  Administered 2018-01-06: 0.2 mg via INTRAVENOUS

## 2018-01-06 MED ORDER — HYDRALAZINE HCL 20 MG/ML IJ SOLN
10.0000 mg | Freq: Once | INTRAMUSCULAR | Status: AC
  Administered 2018-01-06: 10 mg via INTRAVENOUS
  Filled 2018-01-06: qty 1

## 2018-01-06 MED ORDER — IOPAMIDOL (ISOVUE-370) INJECTION 76%
INTRAVENOUS | Status: AC
  Filled 2018-01-06: qty 100

## 2018-01-06 MED ORDER — NALOXONE HCL 0.4 MG/ML IJ SOLN
INTRAMUSCULAR | Status: AC
  Filled 2018-01-06: qty 1

## 2018-01-06 MED ORDER — THIAMINE HCL 100 MG/ML IJ SOLN
100.0000 mg | Freq: Once | INTRAMUSCULAR | Status: AC
  Administered 2018-01-06: 100 mg via INTRAVENOUS
  Filled 2018-01-06: qty 2

## 2018-01-06 MED ORDER — NALOXONE HCL 2 MG/2ML IJ SOSY
2.0000 mg | PREFILLED_SYRINGE | Freq: Once | INTRAMUSCULAR | Status: AC
  Administered 2018-01-06: 2 mg via INTRAVENOUS

## 2018-01-06 MED ORDER — IOPAMIDOL (ISOVUE-370) INJECTION 76%
INTRAVENOUS | Status: AC
  Filled 2018-01-06: qty 50

## 2018-01-06 MED ORDER — NALOXONE HCL 0.4 MG/ML IJ SOLN
0.4000 mg | Freq: Once | INTRAMUSCULAR | Status: AC
  Administered 2018-01-06: 0.4 mg via INTRAVENOUS
  Filled 2018-01-06: qty 1

## 2018-01-06 MED ORDER — NALOXONE HCL 2 MG/2ML IJ SOSY
1.0000 mg | PREFILLED_SYRINGE | Freq: Once | INTRAMUSCULAR | Status: AC
  Administered 2018-01-06: 1 mg via INTRAVENOUS
  Filled 2018-01-06: qty 2

## 2018-01-06 NOTE — ED Provider Notes (Signed)
[facility] EMERGENCY DEPARTMENT Provider Note   CSN: [id number] Arrival date & time: 01/06/18  0946     History   Chief Complaint Chief Complaint  Patient presents with  . Weakness    HPI Toni Gutierrez is a 57 y.o. female.  The history is provided by the patient and a relative.  Altered Mental Status   This is a new problem. The current episode started 1 to 2 hours ago. The problem has been gradually improving. Associated symptoms include somnolence and weakness. Pertinent negatives include no confusion, no seizures, no unresponsiveness, no agitation, no delusions, no hallucinations, no self-injury and no violence. Risk factors include the patient not taking medications correctly (New narcotic prescription and possible taking to many pills. ). Her past medical history does not include seizures, liver disease, CVA, TIA, COPD, head trauma or heart disease.    Past Medical History:  Diagnosis Date  . Alcohol abuse    No alcohol intake since 2001  . Allergic rhinitis   . Arthritis   . Carpal tunnel syndrome, right   . Chronic headache    Thought to be 2/2 cervical DJD vs anxiety. CT head in 2010 without any significant findings  . Chronic lower back pain    MRI of cervical, lumbar spine (all obtained by Dr. Ronnie Derby in 2006)- showing mild DDD with bulges but without disc herniation, at multiple levels; mild facet joint dz, primarily at L3-4 and L4-5 bilaterally  . Heartburn   . Hyperlipidemia   . Hypertension   . Left knee pain    s/p ACL reconstruction by Dr. Ronnie Derby  . Shoulder pain, bilateral    MRI right shoulder showing tear of infraspinatus tendon with bursal surface fraying of the infraspinatus    Patient Active Problem List   Diagnosis Date Noted  . AMS (altered mental status) 01/06/2018  . Chronic prescription opiate use 03/25/2016  . Essential hypertension 07/29/2015  . Rhinitis, allergic 07/29/2015  . GERD (gastroesophageal reflux disease)  11/17/2012  . Chronic back pain 11/17/2012  . Dental caries 11/17/2012  . Sciatica 11/17/2012  . Hypokalemia 08/16/2012  . IT band syndrome 11/16/2011  . Right leg pain 08/31/2011  . Mononeuropathy of left upper extremity 07/15/2010  . Preventative health care 03/27/2010  . Acute pain of left shoulder 03/02/2007  . PAIN, CHRONIC NEC 04/27/2006  . HYPERLIPIDEMIA 03/02/2006  . ALCOHOL ABUSE 11/20/2005  . CARPAL TUNNEL SYNDROME, RIGHT 11/20/2005  . ALLERGIC RHINITIS 11/20/2005    Past Surgical History:  Procedure Laterality Date  . ANTERIOR CRUCIATE LIGAMENT REPAIR     By Dr. Ronnie Derby  . ROTATOR CUFF REPAIR     Right shoulder  . TUBAL LIGATION  1989     OB History   None      Home Medications    Prior to Admission medications   Medication Sig Start Date End Date Taking? Authorizing Provider  gabapentin (NEURONTIN) 300 MG capsule Take 300 mg by mouth at bedtime. 12/03/17  Yes [provider]  HYDROcodone-acetaminophen (NORCO/VICODIN) 5-325 MG tablet Take 1 tablet by mouth 4 (four) times daily. 01/03/18  Yes [provider]  naproxen (NAPROSYN) 375 MG tablet Take 375 mg by mouth 2 (two) times daily. 12/13/17  Yes [provider]  omeprazole (PRILOSEC) 20 MG capsule Take 2 capsules (40 mg total) by mouth daily. 03/29/16  Yes Rai, Ripudeep K, MD  acetaminophen (TYLENOL) 325 MG tablet Take 2 tablets (650 mg total) by mouth every 6 (six) hours  as needed for mild pain or moderate pain. Over the counter Patient not taking: Reported on 01/06/2018 03/29/16   Rai, Vernelle Emerald, MD  amLODipine (NORVASC) 5 MG tablet Take 1 tablet (5 mg total) by mouth daily. Patient not taking: Reported on 01/06/2018 03/29/16   Rai, Vernelle Emerald, MD  cetirizine (ZYRTEC) 10 MG tablet Take 1 tablet (10 mg total) by mouth daily. Patient not taking: Reported on 01/06/2018 10/09/15   Micheline Chapman, NP  lisinopril (PRINIVIL,ZESTRIL) 20 MG tablet Take 1 tablet (20 mg total) by mouth  daily. Patient not taking: Reported on 01/06/2018 03/29/16   Mendel Corning, MD  naproxen (NAPROSYN) 250 MG tablet Take 1 tablet (250 mg total) by mouth 2 (two) times daily with a meal. X 5 days Patient not taking: Reported on 01/06/2018 03/29/16   Rai, Vernelle Emerald, MD  traMADol (ULTRAM) 50 MG tablet Take 1 tablet (50 mg total) by mouth every 6 (six) hours as needed for severe pain. Patient not taking: Reported on 01/06/2018 03/29/16   Mendel Corning, MD    Family History Family History  Problem Relation Age of Onset  . Stroke Mother     Social History Social History   Tobacco Use  . Smoking status: Never Smoker  . Smokeless tobacco: Never Used  Substance Use Topics  . Alcohol use: Yes    Alcohol/week: 21.0 standard drinks    Types: 21 Cans of beer per week    Comment: History of abuse  . Drug use: No     Allergies   Patient has no known allergies.   Review of Systems Review of Systems  Constitutional: Negative for chills and fever.  HENT: Negative for ear pain and sore throat.   Eyes: Negative for pain and visual disturbance.  Respiratory: Negative for cough and shortness of breath.   Cardiovascular: Negative for chest pain and palpitations.  Gastrointestinal: Negative for abdominal pain and vomiting.  Genitourinary: Negative for dysuria and hematuria.  Musculoskeletal: Negative for arthralgias and back pain.  Skin: Negative for color change and rash.  Neurological: Positive for weakness. Negative for dizziness, seizures, syncope, facial asymmetry, light-headedness, numbness and headaches.  Psychiatric/Behavioral: Negative for agitation, confusion, hallucinations and self-injury.  All other systems reviewed and are negative.    Physical Exam Updated Vital Signs  ED Triage Vitals  Enc Vitals Group     BP 01/06/18 1000 (!) 142/106     Pulse Rate 01/06/18 0956 68     Resp 01/06/18 0956 15     Temp 01/06/18 0955 (!) 97.5 F (36.4 C)     Temp Source 01/06/18 0955  Oral     SpO2 01/06/18 0956 98 %     Weight --      Height --      Head Circumference --      Peak Flow --      Pain Score 01/06/18 0955 0     Pain Loc --      Pain Edu? --      Excl. in [place]? --     Physical Exam  Constitutional: She is oriented to person, place, and time. She is easily aroused. No distress.  HENT:  Head: Normocephalic and atraumatic.  Eyes: Conjunctivae and EOM are normal.  Pupils bilaterally are pinpoint and mildly reactive  Neck: Normal range of motion. Neck supple.  Cardiovascular: Normal rate, regular rhythm, normal heart sounds and intact distal pulses.  No murmur heard. Pulmonary/Chest: Effort normal and breath sounds normal.  No stridor. No respiratory distress. She has no wheezes.  Abdominal: Soft. Bowel sounds are normal. She exhibits no distension. There is no tenderness.  Musculoskeletal: She exhibits no edema.  Neurological: She is alert, oriented to person, place, and time and easily aroused. No cranial nerve deficit or sensory deficit. She exhibits normal muscle tone. Coordination normal.  Patient is somnolent but easily arousable and able to participate neurological exam that is overall unremarkable with 5+ out of 5 strength throughout, normal sensation throughout, no obvious drift  Skin: Skin is warm and dry. Capillary refill takes less than 2 seconds.  Psychiatric: She has a normal mood and affect.  Nursing note and vitals reviewed.    ED Treatments / Results  Labs (all labs ordered are listed, but only abnormal results are displayed) Labs Reviewed  COMPREHENSIVE METABOLIC PANEL - Abnormal; Notable for the following components:      Result Value   Potassium 2.8 (*)    BUN <5 (*)    Calcium 8.6 (*)    Total Protein 6.2 (*)    Albumin 3.3 (*)    AST 54 (*)    ALT 52 (*)    All other components within normal limits  CBC WITH DIFFERENTIAL/PLATELET - Abnormal; Notable for the following components:   RBC 3.81 (*)    MCV 104.2 (*)    MCH 36.0  (*)    All other components within normal limits  URINALYSIS, COMPLETE (UACMP) WITH MICROSCOPIC - Abnormal; Notable for the following components:   Color, Urine STRAW (*)    Specific Gravity, Urine 1.003 (*)    All other components within normal limits  RAPID URINE DRUG SCREEN, HOSP PERFORMED - Abnormal; Notable for the following components:   Opiates POSITIVE (*)    All other components within normal limits  ETHANOL - Abnormal; Notable for the following components:   Alcohol, Ethyl (B) 50 (*)    All other components within normal limits  I-STAT VENOUS BLOOD GAS, ED - Abnormal; Notable for the following components:   pO2, Ven 31.0 (*)    Bicarbonate 30.7 (*)    Acid-Base Excess 3.0 (*)    All other components within normal limits  AMMONIA  LIPASE, BLOOD  BLOOD GAS, VENOUS  CBG MONITORING, ED  I-STAT TROPONIN, ED    EKG EKG Interpretation  Date/Time:  Thursday January 06 2018 10:10:53 EST Ventricular Rate:  67 PR Interval:  148 QRS Duration: 74 QT Interval:  460 QTC Calculation: 486 R Axis:   87 Text Interpretation:  Normal sinus rhythm Nonspecific T wave abnormality Prolonged QT Abnormal ECG No significant change since last tracing Confirmed by Lennice Sites [id number]) on 01/06/2018 10:19:38 AM   Radiology Ct Angio Head W Or Wo Contrast  Result Date: 01/06/2018 CLINICAL DATA:  Altered mental status. EXAM: CT ANGIOGRAPHY HEAD AND NECK TECHNIQUE: Multidetector CT imaging of the head and neck was performed using the standard protocol during bolus administration of intravenous contrast. Multiplanar CT image reconstructions and MIPs were obtained to evaluate the vascular anatomy. Carotid stenosis measurements (when applicable) are obtained utilizing NASCET criteria, using the distal internal carotid diameter as the denominator. CONTRAST:  111mL ISOVUE-370 IOPAMIDOL (ISOVUE-370) INJECTION 76% COMPARISON:  None. FINDINGS: CTA NECK FINDINGS Aortic arch: Normal variant 4 vessel aortic  arch with the left vertebral artery arising directly from the arch. Minimal arch atherosclerosis. Widely patent brachiocephalic and subclavian arteries. Right carotid system: Patent without evidence dissection or stenosis. Mild atherosclerotic plaque in the proximal ICA. Left carotid  system: Patent without evidence of dissection or stenosis. Mild atherosclerotic plaque in the proximal ICA. Vertebral arteries: Patent without evidence of dissection or stenosis. Strongly dominant right vertebral artery. Skeleton: No acute osseous abnormality or suspicious osseous lesion. Mild cervical spondylosis and facet arthrosis. Other neck: No evidence of acute abnormality or mass. Upper chest: Partially visualized large hiatal hernia. Review of the MIP images confirms the above findings CTA HEAD FINDINGS Anterior circulation: The internal carotid arteries are patent from skull base to carotid termini with mild-to-moderate calcified plaque bilaterally not resulting in significant stenosis. ACAs and MCAs are patent without evidence of significant stenosis or proximal branch occlusion. No aneurysm is identified. Posterior circulation: The intracranial vertebral arteries are widely patent to the basilar. Patent PICA and SCA origins are identified bilaterally. The basilar artery is widely patent. There is a medium sized right posterior communicating artery. Left posterior communicating artery is diminutive or absent. PCAs are patent without evidence of significant stenosis. No aneurysm is identified. Venous sinuses: Patent. Anatomic variants: None. Delayed phase: No abnormal enhancement. Review of the MIP images confirms the above findings IMPRESSION: Mild intracranial and extracranial atherosclerosis without large vessel occlusion, significant stenosis, or aneurysm. Aortic Atherosclerosis (ICD10-I70.0). Electronically Signed   By: Logan Bores M.D.   On: 01/06/2018 13:13   Ct Head Wo Contrast  Result Date: 01/06/2018 CLINICAL  DATA:  57 year old female with unexplained altered mental status. EXAM: CT HEAD WITHOUT CONTRAST TECHNIQUE: Contiguous axial images were obtained from the base of the skull through the vertex without intravenous contrast. COMPARISON:  Head CT 08/12/2008. Cervical spine MRI 07/09/2004. FINDINGS: Brain: No midline shift, ventriculomegaly, mass effect, evidence of mass lesion, intracranial hemorrhage or evidence of cortically based acute infarction. Gray-white matter differentiation is within normal limits throughout the brain. Vascular: Calcified atherosclerosis at the skull base. Bulky chronic dural calcification along the medial aspect of both middle cranial fossa. No suspicious intracranial vascular hyperdensity. Skull: No acute osseous abnormality identified. Sinuses/Orbits: Visualized paranasal sinuses and mastoids are stable and well pneumatized. Other: Visualized orbits and scalp soft tissues are within normal limits. IMPRESSION: Stable and negative noncontrast CT appearance of the brain. Electronically Signed   By: Genevie Ann M.D.   On: 01/06/2018 10:40   Ct Angio Neck W And/or Wo Contrast  Result Date: 01/06/2018 CLINICAL DATA:  Altered mental status. EXAM: CT ANGIOGRAPHY HEAD AND NECK TECHNIQUE: Multidetector CT imaging of the head and neck was performed using the standard protocol during bolus administration of intravenous contrast. Multiplanar CT image reconstructions and MIPs were obtained to evaluate the vascular anatomy. Carotid stenosis measurements (when applicable) are obtained utilizing NASCET criteria, using the distal internal carotid diameter as the denominator. CONTRAST:  160mL ISOVUE-370 IOPAMIDOL (ISOVUE-370) INJECTION 76% COMPARISON:  None. FINDINGS: CTA NECK FINDINGS Aortic arch: Normal variant 4 vessel aortic arch with the left vertebral artery arising directly from the arch. Minimal arch atherosclerosis. Widely patent brachiocephalic and subclavian arteries. Right carotid system: Patent  without evidence dissection or stenosis. Mild atherosclerotic plaque in the proximal ICA. Left carotid system: Patent without evidence of dissection or stenosis. Mild atherosclerotic plaque in the proximal ICA. Vertebral arteries: Patent without evidence of dissection or stenosis. Strongly dominant right vertebral artery. Skeleton: No acute osseous abnormality or suspicious osseous lesion. Mild cervical spondylosis and facet arthrosis. Other neck: No evidence of acute abnormality or mass. Upper chest: Partially visualized large hiatal hernia. Review of the MIP images confirms the above findings CTA HEAD FINDINGS Anterior circulation: The internal carotid arteries are patent  from skull base to carotid termini with mild-to-moderate calcified plaque bilaterally not resulting in significant stenosis. ACAs and MCAs are patent without evidence of significant stenosis or proximal branch occlusion. No aneurysm is identified. Posterior circulation: The intracranial vertebral arteries are widely patent to the basilar. Patent PICA and SCA origins are identified bilaterally. The basilar artery is widely patent. There is a medium sized right posterior communicating artery. Left posterior communicating artery is diminutive or absent. PCAs are patent without evidence of significant stenosis. No aneurysm is identified. Venous sinuses: Patent. Anatomic variants: None. Delayed phase: No abnormal enhancement. Review of the MIP images confirms the above findings IMPRESSION: Mild intracranial and extracranial atherosclerosis without large vessel occlusion, significant stenosis, or aneurysm. Aortic Atherosclerosis (ICD10-I70.0). Electronically Signed   By: Logan Bores M.D.   On: 01/06/2018 13:13   Dg Chest Portable 1 View  Result Date: 01/06/2018 CLINICAL DATA:  Mental status. EXAM: PORTABLE CHEST 1 VIEW COMPARISON:  Eighteen 2018. FINDINGS: Cardiomegaly with normal pulmonary vascularity. Heart size stable. Large hiatal hernia  again noted. Low lung volumes. No pleural effusion or pneumothorax. IMPRESSION: 1.  Stable cardiomegaly. 2.  Prominent hiatal hernia again noted. 3.  Low lung volumes.  No acute pulmonary infiltrates. Electronically Signed   By: Marcello Moores  Register   On: 01/06/2018 11:50    Procedures .Critical Care Performed by: Lennice Sites, DO Authorized by: Lennice Sites, DO   Critical care provider statement:    Critical care time (minutes):  65   Critical care was necessary to treat or prevent imminent or life-threatening deterioration of the following conditions:  CNS failure or compromise   Critical care was time spent personally by me on the following activities:  Development of treatment plan with patient or surrogate, discussions with consultants, discussions with primary provider, evaluation of patient's response to treatment, examination of patient, obtaining history from patient or surrogate, ordering and performing treatments and interventions, ordering and review of laboratory studies, ordering and review of radiographic studies, pulse oximetry, re-evaluation of patient's condition and review of old charts   I assumed direction of critical care for this patient from another provider in my specialty: no     (including critical care time)  Medications Ordered in ED Medications  potassium chloride 10 mEq in 100 mL IVPB (10 mEq Intravenous New Bag/Given 01/06/18 1357)  iopamidol (ISOVUE-370) 76 % injection (has no administration in time range)  iopamidol (ISOVUE-370) 76 % injection (has no administration in time range)  naloxone [facility]) injection 0.2 mg (0.2 mg Intravenous Given 01/06/18 1003)  naloxone (NARCAN) injection 0.4 mg (0.4 mg Intravenous Given 01/06/18 1047)  naloxone (NARCAN) injection 0.4 mg (0.4 mg Intravenous Given 01/06/18 1112)  thiamine (B-1) injection 100 mg (100 mg Intravenous Given 01/06/18 1120)  naloxone (NARCAN) injection 1 mg (1 mg Intravenous Given 01/06/18 1124)   naloxone (NARCAN) injection 2 mg (2 mg Intravenous Given 01/06/18 1252)  iopamidol (ISOVUE-370) 76 % injection 125 mL (125 mLs Intravenous Contrast Given 01/06/18 1249)  hydrALAZINE (APRESOLINE) injection 10 mg (10 mg Intravenous Given 01/06/18 1357)     Initial Impression / Assessment and Plan / ED Course  I have reviewed the triage vital signs and the nursing notes.  Pertinent labs & imaging results that were available during my care of the patient were reviewed by me and considered in my medical decision making (see chart for details).     Deeandra VERNETA HAMIDI is a 57 year old female with history of chronic back pain on chronic opioids, alcohol abuse,  hypertension, high cholesterol who presents to the ED with altered mental status.  Patient with normal vitals upon arrival.  Patient is somnolent but easily arousable.  She moves all of her extremities.  She has pinpoint pupils, she can tell me her name and where she is.  According to family member patient was found lying on the floor of her house with the water running.  Patient had talked to family member about an hour prior to that she seemed normal.  Patient has narcotic pain prescription which appears to be missing twice as many pills as should be expected at this time after counting the medication.  She was given multiple doses of Narcan with no immediate change.  She did start to yawn and has some other withdrawal type symptoms but no major change in mental status. Patient had a head CT that did not show any acute intracranial process.  Lab work was collected to include ammonia, alcohol level, UDS, blood gas.    Patient according to family member is also a heavy alcohol drinker and also does not maintain a good diet.  She nibbles on food and does not appear to have adequate diet.  Patient also possibly take some sleep medications that they are not aware of what they are.  They deny that she has been depressed or suicidal and has never overdosed  on any medications in the past.  Lab work was overall unremarkable.  No significant leukocytosis, anemia, kidney injury.  Patient with mild hypokalemia which was repleted.  Patient had chest x-ray that showed no signs of pneumonia.  Urinalysis showed no signs of infection.  Urine drug screen was positive for opioids.    Patient had some intermittent improvement and was always easily arousable but never returned back to baseline and thus neurology was consulted for encephalopathy.  They recommend a CTA of the head and neck which was performed that did not show any acute findings.  Ammonia level was within normal limits.  Patient was given a total of 4 mg of Narcan without improvement.  Did not believe opioid is only factor and altered mental status at this time.  Patient had elevated MCV and given alcohol and poor diet she was given IV thiamine as well.  Neurology and I agree that patient most likely with multifactorial encephalopathy possibly from polypharmacy, metabolic process including Wernicke encephalopathy, less likely stroke.  They will likely get MRI and and allow patient to further metabolize.  Possibly post ictal as well.  Possible EEG will be performed. Given waxing and waning of symptoms and no fever doubt infectious process including meningitis.  Patient remained hemodynamically stable throughout my care. Given hydral for blood pressure which likely elevated now due to underlying withdrawal. Patient protecting her airway and continues to be easily arousable while under my care.  This chart was dictated using voice recognition software.  Despite best efforts to proofread,  errors can occur which can change the documentation meaning.   Final Clinical Impressions(s) / ED Diagnoses   Final diagnoses:  Encephalopathy  Polypharmacy    ED Discharge Orders    None       Lennice Sites, DO 01/06/18 1431

## 2018-01-06 NOTE — ED Notes (Signed)
Pt more alert after narcan.

## 2018-01-06 NOTE — ED Notes (Signed)
ED Provider at bedside. 

## 2018-01-06 NOTE — Progress Notes (Signed)
CRITICAL VALUE ALERT  Critical Value:  Lactic acid- 3.3  Date & Time Notied:  01/06/18  Provider Notified: Internal Medicine   Orders Received: None

## 2018-01-06 NOTE — H&P (Signed)
[facility] Admission History and Physical Service Pager: [phone]  Patient name: Toni Gutierrez Medical record number: [id number] Date of birth: [birth year] Age: 57 y.o. Gender: female  Primary Care Provider: Ricke Hey, MD Consultants: neuro Code Status: FULL  Chief Complaint: AMS  Assessment and Plan: ALURA OLVEDA is a 57 y.o. female presenting with AMS. PMH is significant for HTN, chronic pain syndrome, alcohol use disorder, HLD, GERD.   AMS: acute, severe. Differential is broad and includes opioid overdose, overdose of sedatives (report of possible sleeping pills), CVA (however imaging negative for acute bleed), postictal state (no history of seizures, does take tramadol which can lower seizure threshold), metabolic encephalopathy (although no signs of infection or electrolyte derangements other than hypokalemia). No signs of CO2 narcosis on VBG. CTA head and neck without acute findings, decreasing concern for aneurysm or other bleed. EtOH mildly elevated, but likely not to explain the degree of somnolence. Ammonia WNL, less likely HE. HTN emergency is certainly possible, although pt with less concerning BPs on arrival prior to narcan. Some concern that narcan could be inducing partial withdrawal. No fevers noted at home and patient reportedly normal via phone this am, making meninginitis less likely although possible. Weirnicke's possible, acute onset without hx of obtaining from alcohol makes this less likely. Has gotten thiamine in ED. UDS negative other than rx'd opioids. Normal CBG.  Considered possible UTI as a source for altered mental status, however her UA is normal patient does have some suprapubic tenderness. -admit to stepdown, Dr. Andria Frames attending -neuro following, appreciate recs -MRI if no improvement on afternoon exam -serial neuro exams -hold any sedating meds -NPO while altered -high dose thiamine (500mg  IV TID x 3 days then 100mg   daily) -Lactic acid  Hypertensive emergency: on amlodipine and lisinopril at home. Unclear if she took these today. Will use IV PRNs until able to take PO.  -PRN hydralazine for SBP>180, DBP>110  Hypokalemia: to 2.8 without bradycardia or EKG changes. S/p 6 runs 71mEQ. Unclear etiology, no reports of N/V.  -recheck BMP this pm  Elevated LFTs: mild, likely due to chronic alcohol use. Additional differential includes shock liver. Bili WNL.  -monitor on daily labs  FEN/GI: NPO while altered Prophylaxis: lovenox  Disposition: admit to stepdown  History of Present Illness:  Toni Gutierrez is a 57 y.o. female presenting with AMS.  Patient brought in by family members who states she was normal via telephone this morning around 8 AM and they arrived her house to bring her to her doctor's appointment around 9 finding that she was not answering the door and a baby for whom she cares was in the house but safe.  They state that she was found slumped over on the floor next to her try her in the kitchen with water running.  No incontinence of blood around her.  No history of seizures.  She states while intermittently awake that she took two of her pain pills this morning.  Family denies history of SI or previous overdose attempts.  There is some question about whether the patient takes sleep meds at home and if she could have taken that this morning.  Family member has gone home to gather all the medicine bottles.  Family does state she has a history of hypertension.  Was also going to see a surgeon about hiatal hernia possibly.  Known history of alcoholism, they state they think she has been drinking this morning.  No one has noticed any  fever or acting unwell in the days prior, family was with her yesterday and she seemed her normal self.  No history of similar, no history of seizures.  Review Of Systems: Per HPI with the following additions: Limited due to altered mental status  ROS  Patient Active  Problem List   Diagnosis Date Noted  . AMS (altered mental status) 01/06/2018  . Chronic prescription opiate use 03/25/2016  . Essential hypertension 07/29/2015  . Rhinitis, allergic 07/29/2015  . GERD (gastroesophageal reflux disease) 11/17/2012  . Chronic back pain 11/17/2012  . Dental caries 11/17/2012  . Sciatica 11/17/2012  . Hypokalemia 08/16/2012  . IT band syndrome 11/16/2011  . Right leg pain 08/31/2011  . Mononeuropathy of left upper extremity 07/15/2010  . Preventative health care 03/27/2010  . Acute pain of left shoulder 03/02/2007  . PAIN, CHRONIC NEC 04/27/2006  . HYPERLIPIDEMIA 03/02/2006  . ALCOHOL ABUSE 11/20/2005  . CARPAL TUNNEL SYNDROME, RIGHT 11/20/2005  . ALLERGIC RHINITIS 11/20/2005    Past Medical History: Past Medical History:  Diagnosis Date  . Alcohol abuse    No alcohol intake since 2001  . Allergic rhinitis   . Arthritis   . Carpal tunnel syndrome, right   . Chronic headache    Thought to be 2/2 cervical DJD vs anxiety. CT head in 2010 without any significant findings  . Chronic lower back pain    MRI of cervical, lumbar spine (all obtained by Dr. Ronnie Derby in 2006)- showing mild DDD with bulges but without disc herniation, at multiple levels; mild facet joint dz, primarily at L3-4 and L4-5 bilaterally  . Heartburn   . Hyperlipidemia   . Hypertension   . Left knee pain    s/p ACL reconstruction by Dr. Ronnie Derby  . Shoulder pain, bilateral    MRI right shoulder showing tear of infraspinatus tendon with bursal surface fraying of the infraspinatus    Past Surgical History: Past Surgical History:  Procedure Laterality Date  . ANTERIOR CRUCIATE LIGAMENT REPAIR     By Dr. Ronnie Derby  . ROTATOR CUFF REPAIR     Right shoulder  . TUBAL LIGATION  1989    Social History: Social History   Tobacco Use  . Smoking status: Never Smoker  . Smokeless tobacco: Never Used  Substance Use Topics  . Alcohol use: Yes    Alcohol/week: 21.0 standard drinks     Types: 21 Cans of beer per week    Comment: History of abuse  . Drug use: No   Additional social history: Lives alone, cares for infant Please also refer to relevant sections of EMR.  Family History: Family History  Problem Relation Age of Onset  . Stroke Mother     Allergies and Medications: No Known Allergies No current facility-administered medications on file prior to encounter.    Current Outpatient Medications on File Prior to Encounter  Medication Sig Dispense Refill  . gabapentin (NEURONTIN) 300 MG capsule Take 300 mg by mouth at bedtime.  0  . HYDROcodone-acetaminophen (NORCO/VICODIN) 5-325 MG tablet Take 1 tablet by mouth 4 (four) times daily.  0  . naproxen (NAPROSYN) 375 MG tablet Take 375 mg by mouth 2 (two) times daily.  7  . omeprazole (PRILOSEC) 20 MG capsule Take 2 capsules (40 mg total) by mouth daily. 60 capsule 0  . acetaminophen (TYLENOL) 325 MG tablet Take 2 tablets (650 mg total) by mouth every 6 (six) hours as needed for mild pain or moderate pain. Over the counter (Patient not taking:  Reported on 01/06/2018) 30 tablet 3  . amLODipine (NORVASC) 5 MG tablet Take 1 tablet (5 mg total) by mouth daily. (Patient not taking: Reported on 01/06/2018) 30 tablet 3  . cetirizine (ZYRTEC) 10 MG tablet Take 1 tablet (10 mg total) by mouth daily. (Patient not taking: Reported on 01/06/2018) 30 tablet 11  . lisinopril (PRINIVIL,ZESTRIL) 20 MG tablet Take 1 tablet (20 mg total) by mouth daily. (Patient not taking: Reported on 01/06/2018) 30 tablet 3  . naproxen (NAPROSYN) 250 MG tablet Take 1 tablet (250 mg total) by mouth 2 (two) times daily with a meal. X 5 days (Patient not taking: Reported on 01/06/2018) 10 tablet 0  . traMADol (ULTRAM) 50 MG tablet Take 1 tablet (50 mg total) by mouth every 6 (six) hours as needed for severe pain. (Patient not taking: Reported on 01/06/2018) 30 tablet 0    Objective: BP (!) 188/123   Pulse (!) 112   Temp (!) 97.5 F (36.4 C) (Oral)    Resp 20   LMP 12/25/2010   SpO2 100%  Exam: General: Elderly appearing female lying in bed appears older than stated age, intermittently arouses to voice. Eyes: Pupils equal round and reactive to light, normal diameter ENTM: Moist mucous membranes Neck: Supple Cardiovascular: Regular rate and rhythm no murmurs Respiratory: Clear to auscultation bilaterally normal work of breathing Gastrointestinal: Soft, nondistended, TTP over suprapubic region MSK:  decreased muscle bulk to bilateral lower extremities Derm: No wounds on visualized skin, Neuro: Pupils normal in diameter, patient has limited ability to follow commands and is intermittently somnolent, not oriented.  Upper extremities 4 out of 5 bilaterally, unable to do finger-nose-finger, bilateral lower extremities 3 out of 5 Psych: Somnolent  Labs and Imaging: CBC BMET  Recent Labs  Lab 01/06/18 1021  WBC 4.1  HGB 13.7  HCT 39.7  PLT 221   Recent Labs  Lab 01/06/18 1021  NA 137  K 2.8*  CL 102  CO2 26  BUN <5*  CREATININE 0.91  GLUCOSE 81  CALCIUM 8.6*     Ct Angio Head W Or Wo Contrast  Result Date: 01/06/2018 CLINICAL DATA:  Altered mental status. EXAM: CT ANGIOGRAPHY HEAD AND NECK TECHNIQUE: Multidetector CT imaging of the head and neck was performed using the standard protocol during bolus administration of intravenous contrast. Multiplanar CT image reconstructions and MIPs were obtained to evaluate the vascular anatomy. Carotid stenosis measurements (when applicable) are obtained utilizing NASCET criteria, using the distal internal carotid diameter as the denominator. CONTRAST:  176mL ISOVUE-370 IOPAMIDOL (ISOVUE-370) INJECTION 76% COMPARISON:  None. FINDINGS: CTA NECK FINDINGS Aortic arch: Normal variant 4 vessel aortic arch with the left vertebral artery arising directly from the arch. Minimal arch atherosclerosis. Widely patent brachiocephalic and subclavian arteries. Right carotid system: Patent without evidence  dissection or stenosis. Mild atherosclerotic plaque in the proximal ICA. Left carotid system: Patent without evidence of dissection or stenosis. Mild atherosclerotic plaque in the proximal ICA. Vertebral arteries: Patent without evidence of dissection or stenosis. Strongly dominant right vertebral artery. Skeleton: No acute osseous abnormality or suspicious osseous lesion. Mild cervical spondylosis and facet arthrosis. Other neck: No evidence of acute abnormality or mass. Upper chest: Partially visualized large hiatal hernia. Review of the MIP images confirms the above findings CTA HEAD FINDINGS Anterior circulation: The internal carotid arteries are patent from skull base to carotid termini with mild-to-moderate calcified plaque bilaterally not resulting in significant stenosis. ACAs and MCAs are patent without evidence of significant stenosis or proximal branch  occlusion. No aneurysm is identified. Posterior circulation: The intracranial vertebral arteries are widely patent to the basilar. Patent PICA and SCA origins are identified bilaterally. The basilar artery is widely patent. There is a medium sized right posterior communicating artery. Left posterior communicating artery is diminutive or absent. PCAs are patent without evidence of significant stenosis. No aneurysm is identified. Venous sinuses: Patent. Anatomic variants: None. Delayed phase: No abnormal enhancement. Review of the MIP images confirms the above findings IMPRESSION: Mild intracranial and extracranial atherosclerosis without large vessel occlusion, significant stenosis, or aneurysm. Aortic Atherosclerosis (ICD10-I70.0). Electronically Signed   By: Logan Bores M.D.   On: 01/06/2018 13:13   Ct Head Wo Contrast  Result Date: 01/06/2018 CLINICAL DATA:  57 year old female with unexplained altered mental status. EXAM: CT HEAD WITHOUT CONTRAST TECHNIQUE: Contiguous axial images were obtained from the base of the skull through the vertex without  intravenous contrast. COMPARISON:  Head CT 08/12/2008. Cervical spine MRI 07/09/2004. FINDINGS: Brain: No midline shift, ventriculomegaly, mass effect, evidence of mass lesion, intracranial hemorrhage or evidence of cortically based acute infarction. Gray-white matter differentiation is within normal limits throughout the brain. Vascular: Calcified atherosclerosis at the skull base. Bulky chronic dural calcification along the medial aspect of both middle cranial fossa. No suspicious intracranial vascular hyperdensity. Skull: No acute osseous abnormality identified. Sinuses/Orbits: Visualized paranasal sinuses and mastoids are stable and well pneumatized. Other: Visualized orbits and scalp soft tissues are within normal limits. IMPRESSION: Stable and negative noncontrast CT appearance of the brain. Electronically Signed   By: Genevie Ann M.D.   On: 01/06/2018 10:40   Ct Angio Neck W And/or Wo Contrast  Result Date: 01/06/2018 CLINICAL DATA:  Altered mental status. EXAM: CT ANGIOGRAPHY HEAD AND NECK TECHNIQUE: Multidetector CT imaging of the head and neck was performed using the standard protocol during bolus administration of intravenous contrast. Multiplanar CT image reconstructions and MIPs were obtained to evaluate the vascular anatomy. Carotid stenosis measurements (when applicable) are obtained utilizing NASCET criteria, using the distal internal carotid diameter as the denominator. CONTRAST:  173mL ISOVUE-370 IOPAMIDOL (ISOVUE-370) INJECTION 76% COMPARISON:  None. FINDINGS: CTA NECK FINDINGS Aortic arch: Normal variant 4 vessel aortic arch with the left vertebral artery arising directly from the arch. Minimal arch atherosclerosis. Widely patent brachiocephalic and subclavian arteries. Right carotid system: Patent without evidence dissection or stenosis. Mild atherosclerotic plaque in the proximal ICA. Left carotid system: Patent without evidence of dissection or stenosis. Mild atherosclerotic plaque in the  proximal ICA. Vertebral arteries: Patent without evidence of dissection or stenosis. Strongly dominant right vertebral artery. Skeleton: No acute osseous abnormality or suspicious osseous lesion. Mild cervical spondylosis and facet arthrosis. Other neck: No evidence of acute abnormality or mass. Upper chest: Partially visualized large hiatal hernia. Review of the MIP images confirms the above findings CTA HEAD FINDINGS Anterior circulation: The internal carotid arteries are patent from skull base to carotid termini with mild-to-moderate calcified plaque bilaterally not resulting in significant stenosis. ACAs and MCAs are patent without evidence of significant stenosis or proximal branch occlusion. No aneurysm is identified. Posterior circulation: The intracranial vertebral arteries are widely patent to the basilar. Patent PICA and SCA origins are identified bilaterally. The basilar artery is widely patent. There is a medium sized right posterior communicating artery. Left posterior communicating artery is diminutive or absent. PCAs are patent without evidence of significant stenosis. No aneurysm is identified. Venous sinuses: Patent. Anatomic variants: None. Delayed phase: No abnormal enhancement. Review of the MIP images confirms the above findings  IMPRESSION: Mild intracranial and extracranial atherosclerosis without large vessel occlusion, significant stenosis, or aneurysm. Aortic Atherosclerosis (ICD10-I70.0). Electronically Signed   By: Logan Bores M.D.   On: 01/06/2018 13:13   Dg Chest Portable 1 View  Result Date: 01/06/2018 CLINICAL DATA:  Mental status. EXAM: PORTABLE CHEST 1 VIEW COMPARISON:  Eighteen 2018. FINDINGS: Cardiomegaly with normal pulmonary vascularity. Heart size stable. Large hiatal hernia again noted. Low lung volumes. No pleural effusion or pneumothorax. IMPRESSION: 1.  Stable cardiomegaly. 2.  Prominent hiatal hernia again noted. 3.  Low lung volumes.  No acute pulmonary infiltrates.  Electronically Signed   By: Marcello Moores  Register   On: 01/06/2018 11:50     Sela Hilding, MD 01/06/2018, 3:11 PM PGY-3, [facility] pager: [phone], text pages welcome

## 2018-01-06 NOTE — Progress Notes (Signed)
Interim:  Subjective: Patient seen this evening resting and sleeping in bed.  She is relatively easily woken up.  Her daughter is with her at bedside and states the patient has been sleeping all afternoon.  She states she was only awake for about 40 minutes and was fully interactive during that time.  Objective:  Blood pressure 137/88, pulse 88, temperature (!) 97.5 F (36.4 C), temperature source Oral, resp. rate (!) 26, last menstrual period 12/25/2010, SpO2 100 %.  Physical Exam  Neurological: She is alert. She has normal sensation, normal strength and intact cranial nerves. She displays no tremor. GCS score is 15.   Assessment: Toni Gutierrez is a 57 year old female in somnolent, possibly post-ictal state s/p 3mg  Narcan who is easily awoken and appropriately interactive.  Plan: - Neurological assessment is appropriate - Follow up neurology attestation and recommendations for further management - Possible MRI or EEG for further evaluation of somnolence - Continue to monitor throughout the night - May add on diet once patient is more awake  Milus Banister, [place], PGY-1 01/06/2018 9:08 PM

## 2018-01-06 NOTE — Consult Note (Addendum)
Neurology Consultation  Reason for Consult: Mental status with pinpoint pupils/question overdose Referring Physician: Ronnald Nian,  CC: Altered mental status History is obtained from: Family  HPI: Toni Gutierrez is a 57 y.o. female with history of shoulder pain, left knee pain, hypertension, hyperlipidemia, low back pain, alcohol abuse.  Patient was last noted normal at approximately 8 AM.  At that time she was on the phone with her sister.  At approximately 20 sister came to her house and found her on the floor propped up against the dishwasher with the water still running.  Patient was somnolent and snoring. When EMS was present she was alert and able to converse.   Patient was then brought by EMS to the emergency room.  At that time her pupils were pinpoint and they noted that 25 pills are missing out of her Vicodin bottle which was filled just this past Monday was which would equal approximately 21 extra pills.  Family states she was more alert on arrival.  Currently she is too lethargic to state how many pills she took.  Apparently she also drinks approximately 4--24 ounce beers a day.  She had received Narcan while in the ED and became slightly more responsive.  And then she was sent to CT scan.  At that time I was able to see the patient and she was responsive enough to tell me where she was, she had to go to the bathroom, her back was hurting her, show me her thumb and follow simple commands.  Approximately 10 minutes later when I went into check on her patient was extremely somnolent, snoring and only responded to noxious stimuli.  Of note her heart rate was approximately in the 80s but her blood pressure was 222/110.   Chart review- no prior neurological chart review  ROS:* Unable to obtain due to altered mental status.   Past Medical History:  Diagnosis Date  . Alcohol abuse    No alcohol intake since 2001  . Allergic rhinitis   . Arthritis   . Carpal tunnel syndrome, right   .  Chronic headache    Thought to be 2/2 cervical DJD vs anxiety. CT head in 2010 without any significant findings  . Chronic lower back pain    MRI of cervical, lumbar spine (all obtained by Dr. Ronnie Derby in 2006)- showing mild DDD with bulges but without disc herniation, at multiple levels; mild facet joint dz, primarily at L3-4 and L4-5 bilaterally  . Heartburn   . Hyperlipidemia   . Hypertension   . Left knee pain    s/p ACL reconstruction by Dr. Ronnie Derby  . Shoulder pain, bilateral    MRI right shoulder showing tear of infraspinatus tendon with bursal surface fraying of the infraspinatus    Family History  Problem Relation Age of Onset  . Stroke Mother    Social History:   reports that she has never smoked. She has never used smokeless tobacco. She reports that she drinks about 21.0 standard drinks of alcohol per week. She reports that she does not use drugs.  Medications  Current Facility-Administered Medications:  .  hydrALAZINE (APRESOLINE) injection 10 mg, 10 mg, Intravenous, Once, Curatolo, Adam, DO .  iopamidol (ISOVUE-370) 76 % injection, , , ,  .  iopamidol (ISOVUE-370) 76 % injection, , , ,  .  potassium chloride 10 mEq in 100 mL IVPB, 10 mEq, Intravenous, Q1 Hr x 6, Curatolo, Adam, DO, Last Rate: 20 mL/hr at 01/06/18 1251, 10 mEq at 01/06/18  1251  Current Outpatient Medications:  .  HYDROcodone-acetaminophen (NORCO/VICODIN) 5-325 MG tablet, Take 1 tablet by mouth 4 (four) times daily., Disp: , Rfl: 0 .  omeprazole (PRILOSEC) 20 MG capsule, Take 2 capsules (40 mg total) by mouth daily., Disp: 60 capsule, Rfl: 0 .  acetaminophen (TYLENOL) 325 MG tablet, Take 2 tablets (650 mg total) by mouth every 6 (six) hours as needed for mild pain or moderate pain. Over the counter, Disp: 30 tablet, Rfl: 3 .  amLODipine (NORVASC) 5 MG tablet, Take 1 tablet (5 mg total) by mouth daily., Disp: 30 tablet, Rfl: 3 .  cetirizine (ZYRTEC) 10 MG tablet, Take 1 tablet (10 mg total) by mouth daily.,  Disp: 30 tablet, Rfl: 11 .  lisinopril (PRINIVIL,ZESTRIL) 20 MG tablet, Take 1 tablet (20 mg total) by mouth daily., Disp: 30 tablet, Rfl: 3 .  naproxen (NAPROSYN) 250 MG tablet, Take 1 tablet (250 mg total) by mouth 2 (two) times daily with a meal. X 5 days, Disp: 10 tablet, Rfl: 0 .  traMADol (ULTRAM) 50 MG tablet, Take 1 tablet (50 mg total) by mouth every 6 (six) hours as needed for severe pain., Disp: 30 tablet, Rfl: 0   Exam: Current vital signs: BP (!) 222/107   Pulse 69   Temp (!) 97.5 F (36.4 C) (Oral)   Resp (!) 22   LMP 12/25/2010   SpO2 100%  Vital signs in last 24 hours: Temp:  [97.5 F (36.4 C)] 97.5 F (36.4 C) (11/14 0955) Pulse Rate:  [55-78] 69 (11/14 1309) Resp:  [15-33] 22 (11/14 1309) BP: (142-222)/(86-107) 222/107 (11/14 1309) SpO2:  [98 %-100 %] 100 % (11/14 1309)  Physical Exam  Constitutional: Appears well-developed and well-nourished.  Psych: Affect appropriate to situation Eyes: No scleral injection HENT: No OP obstrucion Head: Normocephalic.  Cardiovascular: Normal rate and regular rhythm.  Respiratory: Effort normal, non-labored breathing GI: Soft.  No distension. There is no tenderness.  Skin: WDI  Neuro: Mental Status: Patient is waxing and waning in alertness.  At the time I initially saw her she was lethargic but with wake easily to mild sternal rub, stay awake enough to tell me she was in the hospital and had to go to the bathroom.  She did show me her thumb and tell me how many fingers I was holding up.  Approximately 10 minutes later when I went back in the room after she had gone to the bathroom patient was snoring extremely somnolent and would awake only to noxious stimuli and not follow any commands.  Of note this was also after she received an extra dose of Narcan.  Cranial Nerves: II: Visual Fields are full.. III,IV, VI: EOMI without ptosis or diploplia.  Pupils are equal, round, and reactive to light.  Her pupils when I saw her were  approximately 3 mm down to 2 mm with light V: Facial sensation is symmetric to temperature VII: Facial movement is symmetric.  VIII: hearing is intact to voice X: Uvula elevates symmetrically XI: Shoulder shrug is symmetric. XII: tongue is midline without atrophy or fasciculations.  Motor: When patient is awake she has four 4/5 strength throughout.  No tremor or seizure-like activity noted Sensory: Withdraws to noxious stimuli throughout all 4 extremities Deep Tendon Reflexes: 2+ throughout biceps, patella, brachioradialis Plantars: Toes are downgoing bilaterally.  Cerebellar: Unable to obtain secondary to somnolence  Labs I have reviewed labs in epic and the results pertinent to this consultation are:   CBC  Component Value Date/Time   WBC 4.1 01/06/2018 1021   RBC 3.81 (L) 01/06/2018 1021   HGB 13.7 01/06/2018 1021   HCT 39.7 01/06/2018 1021   PLT 221 01/06/2018 1021   MCV 104.2 (H) 01/06/2018 1021   MCH 36.0 (H) 01/06/2018 1021   MCHC 34.5 01/06/2018 1021   RDW 11.5 01/06/2018 1021   LYMPHSABS 1.4 01/06/2018 1021   MONOABS 0.5 01/06/2018 1021   EOSABS 0.1 01/06/2018 1021   BASOSABS 0.0 01/06/2018 1021    CMP     Component Value Date/Time   NA 137 01/06/2018 1021   K 2.8 (L) 01/06/2018 1021   CL 102 01/06/2018 1021   CO2 26 01/06/2018 1021   GLUCOSE 81 01/06/2018 1021   BUN <5 (L) 01/06/2018 1021   CREATININE 0.91 01/06/2018 1021   CREATININE 0.80 03/25/2016 1047   CALCIUM 8.6 (L) 01/06/2018 1021   PROT 6.2 (L) 01/06/2018 1021   ALBUMIN 3.3 (L) 01/06/2018 1021   AST 54 (H) 01/06/2018 1021   ALT 52 (H) 01/06/2018 1021   ALKPHOS 64 01/06/2018 1021   BILITOT 0.7 01/06/2018 1021   GFRNONAA >60 01/06/2018 1021   GFRNONAA 83 03/25/2016 1047   GFRAA >60 01/06/2018 1021   GFRAA >89 03/25/2016 1047    Lipid Panel     Component Value Date/Time   CHOL 170 06/11/2015 1049   TRIG 95 06/11/2015 1049   HDL 52 06/11/2015 1049   CHOLHDL 3.3 06/11/2015 1049    VLDL 19 06/11/2015 1049   LDLCALC 99 06/11/2015 1049     Imaging I have reviewed the images obtained:  CT-scan of the brain- stable and negative noncontrast CT of the brain  CTA of head neck- mild intracranial and extracranial atherosclerosis with a large vessel occlusion, significant stenosis, or aneurysm  MRI examination of the brain- to be obtained  Etta Quill PA-C [facility] [phone]  M-F  (9:00 am- 5:00 PM)  01/06/2018, 1:25 PM     Assessment:  Is a 57 year old female with history of alcoholism and presented to the hospital with altered mental status and significant somnolence.  Initially she had pinpoint pupils.  Patient was given 3 doses of Narcan which transiently would wake her up.  Given history and the ability of opiates to decrease seizure threshold patient may very well have had a seizure at home after ingesting multiple narcotics and now having elevated blood pressure going through withdrawals/in addition to postictal state.   Toxic/Metabolic Encephalopathy - suspect overdose on opiates, possibly other medication such as neurontin - other concern include HTN encephalopathy ( less likely as blood pressure normal on arrival), seizure ( not witnessed), stroke  Recommendations  Continue to watch closely MR Brain, EEG  if not better by tomorrow morning   NEUROHOSPITALIST ADDENDUM Performed a face to face diagnostic evaluation.   I have reviewed the contents of history and physical exam as documented by PA/ARNP/Resident and agree with above documentation.  I have discussed and formulated the above plan as documented. Edits to the note have been made as needed.  Patient found unresponsive by family with water running early in the morning.  Last known normal was around 8 AM patient talked with family member.  Patient apparently was able to answer some questions before coming to the ER.  On arrival, noted to have pinpoint pupils and extremely lethargic  received Narcan and improved slightly.  She continued to improve following CT angiogram however suddenly became more lethargic and this was despite receiving additional  dose of Narcan.  No obvious evidence of respiratory depression.  On examination patient is lethargic, however has no focal deficits.  Arouses but does not answer questions.  This is a stark difference in her examination per neurology PA versus the patient shortly before the patient was awake and answering questions.  Blood Pressure also significantly elevated, likely effect of receiving Narcan.   CT head was unremarkable.  CT angiogram was negative for any large vessel occlusion such as a basilar artery occlusion.  No obvious seizure activity noted and patient does not have a history of seizures.  Suspect toxic metabolic encephalopathy due to overdose.  If clinically not better will need MRI brain and EEG.  Also consider administering thiamine high-dose due to history of excessive alcohol consumption.     Karena Addison  MD [facility] [phone]   If 7pm to 7am, please call on call as listed on AMION.

## 2018-01-06 NOTE — ED Notes (Signed)
Family at bedside. 

## 2018-01-06 NOTE — ED Triage Notes (Signed)
Pt was found on the kitchen floor at her house by family pt has taken pain medications this morning and seems to be more lethargic than normal.

## 2018-01-06 NOTE — ED Notes (Signed)
MD at bedside. 

## 2018-01-06 NOTE — ED Notes (Signed)
Patient transported to CT 

## 2018-01-06 NOTE — Progress Notes (Signed)
CRITICAL VALUE ALERT  Critical Value:  Lactic Acid 2.9  Date & Time Notied:  01/06/18 at 2117  Provider Notified: [place]  Orders Received/Actions taken: No new orders at this time. This RN gave the result to the patient's RN Bernadene Bell and let her know the MDs had been paged.

## 2018-01-07 ENCOUNTER — Inpatient Hospital Stay (HOSPITAL_COMMUNITY): Payer: Medicare Other

## 2018-01-07 ENCOUNTER — Other Ambulatory Visit (HOSPITAL_COMMUNITY): Payer: Medicare Other

## 2018-01-07 DIAGNOSIS — T402X1A Poisoning by other opioids, accidental (unintentional), initial encounter: Secondary | ICD-10-CM | POA: Diagnosis not present

## 2018-01-07 DIAGNOSIS — I10 Essential (primary) hypertension: Secondary | ICD-10-CM | POA: Diagnosis not present

## 2018-01-07 DIAGNOSIS — G9340 Encephalopathy, unspecified: Secondary | ICD-10-CM | POA: Diagnosis not present

## 2018-01-07 DIAGNOSIS — R4182 Altered mental status, unspecified: Secondary | ICD-10-CM | POA: Diagnosis not present

## 2018-01-07 LAB — LACTIC ACID, PLASMA
Lactic Acid, Venous: 1.4 mmol/L (ref 0.5–1.9)
Lactic Acid, Venous: 1.8 mmol/L (ref 0.5–1.9)

## 2018-01-07 LAB — BASIC METABOLIC PANEL
Anion gap: 7 (ref 5–15)
CALCIUM: 8.6 mg/dL — AB (ref 8.9–10.3)
CHLORIDE: 111 mmol/L (ref 98–111)
CO2: 26 mmol/L (ref 22–32)
CREATININE: 0.68 mg/dL (ref 0.44–1.00)
GFR calc Af Amer: >60 mL/min (ref >60)
GFR calc non Af Amer: >60 mL/min (ref >60)
Glucose, Bld: 86 mg/dL (ref 70–99)
Potassium: 3.3 mmol/L — ABNORMAL LOW (ref 3.5–5.1)
Sodium: 144 mmol/L (ref 135–145)

## 2018-01-07 LAB — CBC
HEMATOCRIT: 38.3 % (ref 36.0–46.0)
Hemoglobin: 13.2 g/dL (ref 12.0–15.0)
MCH: 35.8 pg — AB (ref 26.0–34.0)
MCHC: 34.5 g/dL (ref 30.0–36.0)
MCV: 103.8 fL — AB (ref 80.0–100.0)
NRBC: 0 % (ref 0.0–0.2)
Platelets: 205 10*3/uL (ref 150–400)
RBC: 3.69 MIL/uL — ABNORMAL LOW (ref 3.87–5.11)
RDW: 11.5 % (ref 11.5–15.5)
WBC: 4.1 10*3/uL (ref 4.0–10.5)

## 2018-01-07 LAB — ECHOCARDIOGRAM COMPLETE

## 2018-01-07 LAB — HIV ANTIBODY (ROUTINE TESTING W REFLEX): HIV Screen 4th Generation wRfx: NONREACTIVE

## 2018-01-07 MED ORDER — HYDROCODONE-ACETAMINOPHEN 5-325 MG PO TABS
1.0000 | ORAL_TABLET | Freq: Three times a day (TID) | ORAL | Status: DC | PRN
  Administered 2018-01-07: 1 via ORAL
  Filled 2018-01-07: qty 1

## 2018-01-07 MED ORDER — HYDRALAZINE HCL 20 MG/ML IJ SOLN
10.0000 mg | Freq: Once | INTRAMUSCULAR | Status: AC
  Administered 2018-01-07: 10 mg via INTRAVENOUS

## 2018-01-07 MED ORDER — NAPROXEN 250 MG PO TABS
500.0000 mg | ORAL_TABLET | Freq: Once | ORAL | Status: AC
  Administered 2018-01-07: 500 mg via ORAL
  Filled 2018-01-07: qty 2

## 2018-01-07 MED ORDER — LISINOPRIL 20 MG PO TABS
20.0000 mg | ORAL_TABLET | Freq: Every day | ORAL | Status: DC
  Administered 2018-01-07: 20 mg via ORAL
  Filled 2018-01-07: qty 1

## 2018-01-07 MED ORDER — AMLODIPINE BESYLATE 10 MG PO TABS
10.0000 mg | ORAL_TABLET | Freq: Every day | ORAL | Status: DC
  Administered 2018-01-07: 10 mg via ORAL
  Filled 2018-01-07: qty 1

## 2018-01-07 NOTE — Progress Notes (Signed)
  Echocardiogram 2D Echocardiogram has been performed.  Toni Gutierrez 01/07/2018, 3:49 PM

## 2018-01-07 NOTE — Progress Notes (Signed)
Nadean Corwin to be D/C'd to home per MD order.  Discussed with the patient and all questions fully answered.  VSS, Skin clean, dry and intact without evidence of skin break down, no evidence of skin tears noted. IV catheters discontinued intact. Site without signs and symptoms of complications. Dressing and pressure applied.  An After Visit Summary was printed and given to the patient.   D/c education completed with patient/family including follow up instructions, medication list, d/c activities limitations if indicated, with other d/c instructions as indicated by MD - patient able to verbalize understanding, all questions fully answered.   Patient instructed to return to ED, call 911, or call MD for any changes in condition.   Patient escorted via [place], and D/C home via private auto.  Lynann Beaver 01/07/2018 6:26 PM

## 2018-01-07 NOTE — Discharge Summary (Signed)
[facility] Discharge Summary  Patient name: Toni Gutierrez Medical record number: [id number] Date of birth: 04-Jul-1960 Age: 57 y.o. Gender: female Date of Admission: 01/06/2018  Date of Discharge: 01/07/18 Admitting Physician: Alveda Reasons, MD  Primary Care Provider: Penni Bombard, PA Consultants: Neurology  Indication for Hospitalization: altered mental status  Discharge Diagnoses/Problem List:  Hypertension Hypokalemia Elevated LFTs, likely d/t chronic alcohol use Chronic pain syndrome  Disposition: home  Discharge Condition: stable, improved  Discharge Exam:  General: Elderly appearing female comfortably lying in bed, in NAD Cardiovascular: RRR, no MRG Respiratory: CTAB, normal WOB Gastrointestinal: Soft, nondistended, nontender Neuro: CN II-XII grossly intact, alert and oriented x 3 Psych: appropriate mood and affect  Brief Hospital Course:  Toni Gutierrez was admitted on 01/06/2018 after family found her to be somnolent at home with possible syncope.  UDS was positive only for her prescribed opioids.  [place] Controlled Substances Database was checked and patient has not received any other controlled medications other than Norco.  CT head was negative for acute findings.  VBG was without hypercapnia, and electrolytes were within normal limits.  Ethanol level was mildly elevated, but not enough to attribute altered mental status to alcohol intoxication.  Concerned that patient overdosed on opioids, but Narcan was not very successful in resuscitating her.  Neurology was consulted, and their differential included likely overdose on opioids with possible combination with alcohol or gabapentin, hypertensive encephalopathy, and seizure.  However, they did not feel that further work-up was warranted.  Patient's blood pressures were elevated during admission, with the highest measurement at 226/120, but higher pressures were intermittent, with normal  pressures in between.  An echo was performed to make sure that she did not have structural abnormalities causing her possible syncope, and no valvular or wall motion abnormalities were noted.  Since the patient's mental status greatly improved on 11/15 and her work-up has been reassuring, she was discharged on 01/07/2018.  Issues for Follow Up:  1. We stopped patient's tramadol on discharge, since this medication can lower the seizure threshold. 2. Consider decreasing patient's prescription of narcotics, since this may have played a role in her altered mental status. 3. Reevaluate patient's blood pressure and prescribed new agents as needed. 4. Recheck a BMP since patient was hypokalemic to 2.8 during her hospitalization.  Significant Procedures: none  Significant Labs and Imaging:  Recent Labs  Lab 01/06/18 1021 01/07/18 0319  WBC 4.1 4.1  HGB 13.7 13.2  HCT 39.7 38.3  PLT 221 205   Recent Labs  Lab 01/06/18 1021 01/06/18 1704 01/07/18 0319  NA 137 143 144  K 2.8* 3.3* 3.3*  CL 102 109 111  CO2 26 25 26   GLUCOSE 81 74 86  BUN <5* <5* <5*  CREATININE 0.91 0.69 0.68  CALCIUM 8.6* 9.1 8.6*  ALKPHOS 64  --   --   AST 54*  --   --   ALT 52*  --   --   ALBUMIN 3.3*  --   --     Ct Angio Head W Or Wo Contrast  Result Date: 01/06/2018 CLINICAL DATA:  Altered mental status. EXAM: CT ANGIOGRAPHY HEAD AND NECK TECHNIQUE: Multidetector CT imaging of the head and neck was performed using the standard protocol during bolus administration of intravenous contrast. Multiplanar CT image reconstructions and MIPs were obtained to evaluate the vascular anatomy. Carotid stenosis measurements (when applicable) are obtained utilizing NASCET criteria, using the distal internal carotid diameter as the denominator.  CONTRAST:  146mL ISOVUE-370 IOPAMIDOL (ISOVUE-370) INJECTION 76% COMPARISON:  None. FINDINGS: CTA NECK FINDINGS Aortic arch: Normal variant 4 vessel aortic arch with the left vertebral  artery arising directly from the arch. Minimal arch atherosclerosis. Widely patent brachiocephalic and subclavian arteries. Right carotid system: Patent without evidence dissection or stenosis. Mild atherosclerotic plaque in the proximal ICA. Left carotid system: Patent without evidence of dissection or stenosis. Mild atherosclerotic plaque in the proximal ICA. Vertebral arteries: Patent without evidence of dissection or stenosis. Strongly dominant right vertebral artery. Skeleton: No acute osseous abnormality or suspicious osseous lesion. Mild cervical spondylosis and facet arthrosis. Other neck: No evidence of acute abnormality or mass. Upper chest: Partially visualized large hiatal hernia. Review of the MIP images confirms the above findings CTA HEAD FINDINGS Anterior circulation: The internal carotid arteries are patent from skull base to carotid termini with mild-to-moderate calcified plaque bilaterally not resulting in significant stenosis. ACAs and MCAs are patent without evidence of significant stenosis or proximal branch occlusion. No aneurysm is identified. Posterior circulation: The intracranial vertebral arteries are widely patent to the basilar. Patent PICA and SCA origins are identified bilaterally. The basilar artery is widely patent. There is a medium sized right posterior communicating artery. Left posterior communicating artery is diminutive or absent. PCAs are patent without evidence of significant stenosis. No aneurysm is identified. Venous sinuses: Patent. Anatomic variants: None. Delayed phase: No abnormal enhancement. Review of the MIP images confirms the above findings IMPRESSION: Mild intracranial and extracranial atherosclerosis without large vessel occlusion, significant stenosis, or aneurysm. Aortic Atherosclerosis (ICD10-I70.0). Electronically Signed   By: Logan Bores M.D.   On: 01/06/2018 13:13   Ct Head Wo Contrast  Result Date: 01/06/2018 CLINICAL DATA:  57 year old female with  unexplained altered mental status. EXAM: CT HEAD WITHOUT CONTRAST TECHNIQUE: Contiguous axial images were obtained from the base of the skull through the vertex without intravenous contrast. COMPARISON:  Head CT 08/12/2008. Cervical spine MRI 07/09/2004. FINDINGS: Brain: No midline shift, ventriculomegaly, mass effect, evidence of mass lesion, intracranial hemorrhage or evidence of cortically based acute infarction. Gray-white matter differentiation is within normal limits throughout the brain. Vascular: Calcified atherosclerosis at the skull base. Bulky chronic dural calcification along the medial aspect of both middle cranial fossa. No suspicious intracranial vascular hyperdensity. Skull: No acute osseous abnormality identified. Sinuses/Orbits: Visualized paranasal sinuses and mastoids are stable and well pneumatized. Other: Visualized orbits and scalp soft tissues are within normal limits. IMPRESSION: Stable and negative noncontrast CT appearance of the brain. Electronically Signed   By: Genevie Ann M.D.   On: 01/06/2018 10:40   Ct Angio Neck W And/or Wo Contrast  Result Date: 01/06/2018 CLINICAL DATA:  Altered mental status. EXAM: CT ANGIOGRAPHY HEAD AND NECK TECHNIQUE: Multidetector CT imaging of the head and neck was performed using the standard protocol during bolus administration of intravenous contrast. Multiplanar CT image reconstructions and MIPs were obtained to evaluate the vascular anatomy. Carotid stenosis measurements (when applicable) are obtained utilizing NASCET criteria, using the distal internal carotid diameter as the denominator. CONTRAST:  140mL ISOVUE-370 IOPAMIDOL (ISOVUE-370) INJECTION 76% COMPARISON:  None. FINDINGS: CTA NECK FINDINGS Aortic arch: Normal variant 4 vessel aortic arch with the left vertebral artery arising directly from the arch. Minimal arch atherosclerosis. Widely patent brachiocephalic and subclavian arteries. Right carotid system: Patent without evidence dissection or  stenosis. Mild atherosclerotic plaque in the proximal ICA. Left carotid system: Patent without evidence of dissection or stenosis. Mild atherosclerotic plaque in the proximal ICA. Vertebral arteries: Patent without  evidence of dissection or stenosis. Strongly dominant right vertebral artery. Skeleton: No acute osseous abnormality or suspicious osseous lesion. Mild cervical spondylosis and facet arthrosis. Other neck: No evidence of acute abnormality or mass. Upper chest: Partially visualized large hiatal hernia. Review of the MIP images confirms the above findings CTA HEAD FINDINGS Anterior circulation: The internal carotid arteries are patent from skull base to carotid termini with mild-to-moderate calcified plaque bilaterally not resulting in significant stenosis. ACAs and MCAs are patent without evidence of significant stenosis or proximal branch occlusion. No aneurysm is identified. Posterior circulation: The intracranial vertebral arteries are widely patent to the basilar. Patent PICA and SCA origins are identified bilaterally. The basilar artery is widely patent. There is a medium sized right posterior communicating artery. Left posterior communicating artery is diminutive or absent. PCAs are patent without evidence of significant stenosis. No aneurysm is identified. Venous sinuses: Patent. Anatomic variants: None. Delayed phase: No abnormal enhancement. Review of the MIP images confirms the above findings IMPRESSION: Mild intracranial and extracranial atherosclerosis without large vessel occlusion, significant stenosis, or aneurysm. Aortic Atherosclerosis (ICD10-I70.0). Electronically Signed   By: Logan Bores M.D.   On: 01/06/2018 13:13   Dg Chest Portable 1 View  Result Date: 01/06/2018 CLINICAL DATA:  Mental status. EXAM: PORTABLE CHEST 1 VIEW COMPARISON:  Eighteen 2018. FINDINGS: Cardiomegaly with normal pulmonary vascularity. Heart size stable. Large hiatal hernia again noted. Low lung volumes. No  pleural effusion or pneumothorax. IMPRESSION: 1.  Stable cardiomegaly. 2.  Prominent hiatal hernia again noted. 3.  Low lung volumes.  No acute pulmonary infiltrates. Electronically Signed   By: Marcello Moores  Register   On: 01/06/2018 11:50     Results/Tests Pending at Time of Discharge: none  Discharge Medications:  Allergies as of 01/07/2018   No Known Allergies     Medication List    STOP taking these medications   traMADol 50 MG tablet Commonly known as:  ULTRAM     TAKE these medications   acetaminophen 325 MG tablet Commonly known as:  TYLENOL Take 2 tablets (650 mg total) by mouth every 6 (six) hours as needed for mild pain or moderate pain. Over the counter   amLODipine 5 MG tablet Commonly known as:  NORVASC Take 1 tablet (5 mg total) by mouth daily.   cetirizine 10 MG tablet Commonly known as:  ZYRTEC Take 1 tablet (10 mg total) by mouth daily.   gabapentin 300 MG capsule Commonly known as:  NEURONTIN Take 300 mg by mouth at bedtime.   HYDROcodone-acetaminophen 5-325 MG tablet Commonly known as:  NORCO/VICODIN Take 1 tablet by mouth 4 (four) times daily.   lisinopril 20 MG tablet Commonly known as:  PRINIVIL,ZESTRIL Take 1 tablet (20 mg total) by mouth daily.   naproxen 250 MG tablet Commonly known as:  NAPROSYN Take 1 tablet (250 mg total) by mouth 2 (two) times daily with a meal. X 5 days What changed:  Another medication with the same name was removed. Continue taking this medication, and follow the directions you see here.   omeprazole 20 MG capsule Commonly known as:  PRILOSEC Take 2 capsules (40 mg total) by mouth daily.       Discharge Instructions: Please refer to Patient Instructions section of EMR for full details.  Patient was counseled important signs and symptoms that should prompt return to medical care, changes in medications, dietary instructions, activity restrictions, and follow up appointments.   Follow-Up Appointments:   Kathrene Alu, MD 01/07/2018, 6:05  PM PGY-2, [place]

## 2018-01-07 NOTE — Discharge Instructions (Signed)
We are uncertain of the reason why you lost consciousness yesterday, but we think it may be because of some of the medications you are taking.  Please only take Norco if you really need it, and do not take any more tramadol.  We have looked at your heart function, and it appears like it is not the reason why this occurred.  Please see your doctor within the week to make sure that you are still doing better.

## 2018-01-07 NOTE — Progress Notes (Signed)
Patient's heart rate sustaining in the 120's - 130's. Asymptomatic had just hung up the phone with her husband, upset over the call.  Dr. Maudie Mercury paged and came to floor to assess patient. Will continue to monitor.

## 2018-01-07 NOTE — Progress Notes (Addendum)
NEURO HOSPITALIST PROGRESS NOTE   Subjective: Patient awake in bed, alert, eyes open on the phone,  NAD, family at bedside. Patient c/o lower back pain and right arm pain. Per patient and family she is back to baseline. States that 0850-9:15 is the time frame from which she believe she became altered until when she was found. So about 25 minutes. She remembers trying to wash bottles about 0850 and then nothing else. She was found about 0915.  Exam: Vitals:   01/06/18 2200 01/07/18 0742  BP: (!) 134/91 (!) 194/103  Pulse:  74  Resp:  14  Temp: 98.5 F (36.9 C) 98.7 F (37.1 C)  SpO2:  96%    Physical Exam   HEENT-  Normocephalic, no lesions, without obvious abnormality.  Normal external eye and conjunctiva.   Cardiovascular- S1-S2 audible, pulses palpable throughout BP: elevated  194/193  Lungs-no rhonchi or wheezing noted, no excessive working  breathing.  Saturations within normal limits on RA.  Abdomen- All 4 quadrants palpated and nontender Extremities- Warm, dry and intact Musculoskeletal-no joint tenderness, deformity or swelling Skin-warm and dry, no hyperpigmentation, vitiligo, or suspicious lesions   Neuro:  Mental Status: Alert, oriented, thought content appropriate. Oriented to name/age/ month/ year  Speech fluent without evidence of aphasia.  Able to follow commands without difficulty. Cranial Nerves: II:  Visual fields grossly normal,  III,IV, VI: ptosis not present, extra-ocular motions intact bilaterally 64mm pupils equal, round, reactive to light  V,VII: smile symmetric, facial light touch / cool temperature sensation normal bilaterally VIII: hearing normal bilaterally IX,X: uvula rises symmetrically XI: bilateral shoulder shrug XII: midline tongue extension Motor: Right : Upper extremity   4/5 Left:     Upper extremity   4/5  Lower extremity   5/5  Lower extremity   5/5 Tone and bulk:normal tone throughout; no atrophy noted Sensory: cool  temp/ light touch intact throughout, bilaterally Deep Tendon Reflexes: 2+ and symmetric biceps, patella Plantars: Right: downgoing   Left: downgoing Cerebellar:  normal heel-to-shin test Gait: deferred    Medications:  Scheduled: . enoxaparin (LOVENOX) injection  40 mg Subcutaneous Q24H   Continuous: . thiamine injection 500 mg (01/06/18 2316)   [id number] **OR** acetaminophen, hydrALAZINE  Pertinent Labs/Diagnostics:   Ct Angio Head W Or Wo Contrast  Result Date: 01/06/2018 CLINICAL DATA:  Altered mental status. EXAM: CT ANGIOGRAPHY HEAD AND NECK TECHNIQUE: Multidetector CT imaging of the head and neck was performed using the standard protocol during bolus administration of intravenous contrast. Multiplanar CT image reconstructions and MIPs were obtained to evaluate the vascular anatomy. Carotid stenosis measurements (when applicable) are obtained utilizing NASCET criteria, using the distal internal carotid diameter as the denominator. CONTRAST:  123mL ISOVUE-370 IOPAMIDOL (ISOVUE-370) INJECTION 76% COMPARISON:  None. FINDINGS: CTA NECK FINDINGS Aortic arch: Normal variant 4 vessel aortic arch with the left vertebral artery arising directly from the arch. Minimal arch atherosclerosis. Widely patent brachiocephalic and subclavian arteries. Right carotid system: Patent without evidence dissection or stenosis. Mild atherosclerotic plaque in the proximal ICA. Left carotid system: Patent without evidence of dissection or stenosis. Mild atherosclerotic plaque in the proximal ICA. Vertebral arteries: Patent without evidence of dissection or stenosis. Strongly dominant right vertebral artery. Skeleton: No acute osseous abnormality or suspicious osseous lesion. Mild cervical spondylosis and facet arthrosis. Other neck: No evidence of acute abnormality or mass. Upper chest: Partially visualized large hiatal  hernia. Review of the MIP images confirms the above findings CTA HEAD FINDINGS Anterior  circulation: The internal carotid arteries are patent from skull base to carotid termini with mild-to-moderate calcified plaque bilaterally not resulting in significant stenosis. ACAs and MCAs are patent without evidence of significant stenosis or proximal branch occlusion. No aneurysm is identified. Posterior circulation: The intracranial vertebral arteries are widely patent to the basilar. Patent PICA and SCA origins are identified bilaterally. The basilar artery is widely patent. There is a medium sized right posterior communicating artery. Left posterior communicating artery is diminutive or absent. PCAs are patent without evidence of significant stenosis. No aneurysm is identified. Venous sinuses: Patent. Anatomic variants: None. Delayed phase: No abnormal enhancement. Review of the MIP images confirms the above findings IMPRESSION: Mild intracranial and extracranial atherosclerosis without large vessel occlusion, significant stenosis, or aneurysm. Aortic Atherosclerosis (ICD10-I70.0). Electronically Signed   By: Logan Bores M.D.   On: 01/06/2018 13:13   Ct Head Wo Contrast  Result Date: 01/06/2018 CLINICAL DATA:  57 year old female with unexplained altered mental status. EXAM: CT HEAD WITHOUT CONTRAST TECHNIQUE: Contiguous axial images were obtained from the base of the skull through the vertex without intravenous contrast. COMPARISON:  Head CT 08/12/2008. Cervical spine MRI 07/09/2004. FINDINGS: Brain: No midline shift, ventriculomegaly, mass effect, evidence of mass lesion, intracranial hemorrhage or evidence of cortically based acute infarction. Gray-white matter differentiation is within normal limits throughout the brain. Vascular: Calcified atherosclerosis at the skull base. Bulky chronic dural calcification along the medial aspect of both middle cranial fossa. No suspicious intracranial vascular hyperdensity. Skull: No acute osseous abnormality identified. Sinuses/Orbits: Visualized paranasal  sinuses and mastoids are stable and well pneumatized. Other: Visualized orbits and scalp soft tissues are within normal limits. IMPRESSION: Stable and negative noncontrast CT appearance of the brain. Electronically Signed   By: Genevie Ann M.D.   On: 01/06/2018 10:40   Ct Angio Neck W And/or Wo Contrast  Result Date: 01/06/2018 CLINICAL DATA:  Altered mental status. EXAM: CT ANGIOGRAPHY HEAD AND NECK TECHNIQUE: Multidetector CT imaging of the head and neck was performed using the standard protocol during bolus administration of intravenous contrast. Multiplanar CT image reconstructions and MIPs were obtained to evaluate the vascular anatomy. Carotid stenosis measurements (when applicable) are obtained utilizing NASCET criteria, using the distal internal carotid diameter as the denominator. CONTRAST:  128mL ISOVUE-370 IOPAMIDOL (ISOVUE-370) INJECTION 76% COMPARISON:  None. FINDINGS: CTA NECK FINDINGS Aortic arch: Normal variant 4 vessel aortic arch with the left vertebral artery arising directly from the arch. Minimal arch atherosclerosis. Widely patent brachiocephalic and subclavian arteries. Right carotid system: Patent without evidence dissection or stenosis. Mild atherosclerotic plaque in the proximal ICA. Left carotid system: Patent without evidence of dissection or stenosis. Mild atherosclerotic plaque in the proximal ICA. Vertebral arteries: Patent without evidence of dissection or stenosis. Strongly dominant right vertebral artery. Skeleton: No acute osseous abnormality or suspicious osseous lesion. Mild cervical spondylosis and facet arthrosis. Other neck: No evidence of acute abnormality or mass. Upper chest: Partially visualized large hiatal hernia. Review of the MIP images confirms the above findings CTA HEAD FINDINGS Anterior circulation: The internal carotid arteries are patent from skull base to carotid termini with mild-to-moderate calcified plaque bilaterally not resulting in significant stenosis.  ACAs and MCAs are patent without evidence of significant stenosis or proximal branch occlusion. No aneurysm is identified. Posterior circulation: The intracranial vertebral arteries are widely patent to the basilar. Patent PICA and SCA origins are identified bilaterally. The basilar artery is  widely patent. There is a medium sized right posterior communicating artery. Left posterior communicating artery is diminutive or absent. PCAs are patent without evidence of significant stenosis. No aneurysm is identified. Venous sinuses: Patent. Anatomic variants: None. Delayed phase: No abnormal enhancement. Review of the MIP images confirms the above findings IMPRESSION: Mild intracranial and extracranial atherosclerosis without large vessel occlusion, significant stenosis, or aneurysm. Aortic Atherosclerosis (ICD10-I70.0). Electronically Signed   By: Logan Bores M.D.   On: 01/06/2018 13:13   Dg Chest Portable 1 View  Result Date: 01/06/2018 CLINICAL DATA:  Mental status. EXAM: PORTABLE CHEST 1 VIEW COMPARISON:  Eighteen 2018. FINDINGS: Cardiomegaly with normal pulmonary vascularity. Heart size stable. Large hiatal hernia again noted. Low lung volumes. No pleural effusion or pneumothorax. IMPRESSION: 1.  Stable cardiomegaly. 2.  Prominent hiatal hernia again noted. 3.  Low lung volumes.  No acute pulmonary infiltrates. Electronically Signed   By: Marcello Moores  Register   On: 01/06/2018 11:50   Assessment:  Is a 57 year old female with history of alcoholism and presented to the hospital with altered mental status and significant somnolence.  Initially she had pinpoint pupils.  Patient was given 3 doses of Narcan which transiently would wake her up.  Given history and the ability of opiates to decrease seizure threshold patient may very well have had a seizure at home after ingesting multiple narcotics and now having elevated blood pressure going through withdrawals/in addition to postictal state. Given that she is back to  baseline today. Exam is normal. BP elevated which patient states is d/t her back pain. Most likely, accidental overdose. No further work- up needed.  Impression: Toxic/Metabolic Encephalopathy - suspect overdose on opiates, possibly other medication such as neurontin - other concern include HTN encephalopathy ( less likely as blood pressure normal on arrival), seizure ( not witnessed), stroke  Recommendations:  - continue to monitor mental status - Neurology to sign-off at this time. Please page with further concerns.  Laurey Morale, MSN, NP-C [facility] [phone]   Attending neurologist's note to follow    01/07/2018, 8:49 AM    NEUROHOSPITALIST ADDENDUM Performed a face to face diagnostic evaluation.   I have reviewed the contents of history and physical exam as documented by PA/ARNP/Resident and agree with above documentation.  I have discussed and formulated the above plan as documented. Edits to the note have been made as needed.  She has returned to her baseline last evening.  Suspect toxic encephalopathy, however patient denies overdosing on any of her medications.  Do not think she needs an MRI brain or EEG or any further neurological testing.    Karena Addison Abhi Moccia MD [facility] [phone]   If 7pm to 7am, please call on call as listed on AMION.

## 2018-01-12 ENCOUNTER — Ambulatory Visit (INDEPENDENT_AMBULATORY_CARE_PROVIDER_SITE_OTHER): Payer: Medicare Other | Admitting: Gastroenterology

## 2018-01-12 ENCOUNTER — Encounter: Payer: Self-pay | Admitting: Gastroenterology

## 2018-01-12 ENCOUNTER — Other Ambulatory Visit (INDEPENDENT_AMBULATORY_CARE_PROVIDER_SITE_OTHER): Payer: Medicare Other

## 2018-01-12 VITALS — BP 196/100 | HR 78 | Ht 59.0 in | Wt 120.0 lb

## 2018-01-12 DIAGNOSIS — K219 Gastro-esophageal reflux disease without esophagitis: Secondary | ICD-10-CM

## 2018-01-12 DIAGNOSIS — R1013 Epigastric pain: Secondary | ICD-10-CM

## 2018-01-12 DIAGNOSIS — K449 Diaphragmatic hernia without obstruction or gangrene: Secondary | ICD-10-CM | POA: Diagnosis not present

## 2018-01-12 LAB — COMPREHENSIVE METABOLIC PANEL
ALBUMIN: 3.9 g/dL (ref 3.5–5.2)
ALT: 26 U/L (ref 0–35)
AST: 31 U/L (ref 0–37)
Alkaline Phosphatase: 73 U/L (ref 39–117)
BILIRUBIN TOTAL: 0.4 mg/dL (ref 0.2–1.2)
BUN: 13 mg/dL (ref 6–23)
CALCIUM: 9.1 mg/dL (ref 8.4–10.5)
CO2: 30 mEq/L (ref 19–32)
CREATININE: 0.67 mg/dL (ref 0.40–1.20)
Chloride: 104 mEq/L (ref 96–112)
GFR: 116.23 mL/min (ref >60.00)
Glucose, Bld: 81 mg/dL (ref 70–99)
POTASSIUM: 3.4 meq/L — AB (ref 3.5–5.1)
Sodium: 140 mEq/L (ref 135–145)
Total Protein: 6.8 g/dL (ref 6.0–8.3)

## 2018-01-12 LAB — CBC
HEMATOCRIT: 41.4 % (ref 36.0–46.0)
Hemoglobin: 14.4 g/dL (ref 12.0–15.0)
MCHC: 34.9 g/dL (ref 30.0–36.0)
MCV: 105.5 fl — AB (ref 78.0–100.0)
Platelets: 273 10*3/uL (ref 150.0–400.0)
RBC: 3.92 Mil/uL (ref 3.87–5.11)
RDW: 12.8 % (ref 11.5–15.5)
WBC: 5 10*3/uL (ref 4.0–10.5)

## 2018-01-12 LAB — PROTIME-INR
INR: 1 ratio (ref 0.8–1.0)
PROTHROMBIN TIME: 11.5 s (ref 9.6–13.1)

## 2018-01-12 NOTE — Patient Instructions (Addendum)
You will be set up for an upper endoscopy for dyspepsia, HH, GERD.  You should change the way you are taking your antiacid medicine (prilosec) so that you are taking it 20-30 minutes prior to a decent meal as that is the way the pill is designed to work most effectively.  You will have labs checked today in the basement lab.  Please head down after you check out with the front desk  (cmet, inr, cbc).  Thank you for entrusting me with your care and choosing [facility].  Dr Ardis Hughs

## 2018-01-12 NOTE — Progress Notes (Signed)
HPI: This is a pleasant 57 year old woman who was referred to me bby Dr. Greer Pickerel at [facility]  Chief complaint is hiatal hernia  Chest x-ray November 2019 shows "large hiatal hernia"  She was admitted last week for a brief stay with mental status changes that were felt possibly from alcohol, opioid use.  By the time of her discharge her mental status was back to normal.  Brain imaging was underwhelming.  While she was hospitalized she had an echocardiogram and it showed an ejection fraction of 65 to 70%.  Neurology consultation states that she drinks between 4 24 ounce beers daily as well as takes Vicodin.  CBC during her admission shows an MCV of 104, normal hemoglobin.  She was actually referred by Dr. Greer Pickerel from [facility] surgery.  He saw her for gallstones and abdominal pain.  He did not feel that her gallstones were the cause of her abdominal discomforts and felt they were more likely related to what has been called a large hiatal hernia.   Takes omeprazole once daily, has been for .  Takes usually after breakfast.  No abd [pains, no nausea.    She feels bloated a lot.  No RUQ pains.  No epigastric pains. No nauesa or vomiting.  No dysphasia  Someone recently told her that she "needed something prescribed for her liver."  She is not sure why they told her that  Hydrocodone for several years. 1 pill 4 times per day. 5mg  pill for back pains.  She used to be an alcoholic from what sounds however she stopped until the past several months and she has been drinking 3 or 4 canned margarita drinks daily.  She tells me there 8% alcohol.   Review of systems: Pertinent positive and negative review of systems were noted in the above HPI section. All other review negative.   Past Medical History:  Diagnosis Date  . Alcohol abuse    No alcohol intake since 2001  . Allergic rhinitis   . Arthritis    "back, hands" (01/06/2018)  . Carpal tunnel syndrome, right   .  Chronic headache    Thought to be 2/2 cervical DJD vs anxiety. CT head in 2010 without any significant findings  . Chronic lower back pain    MRI of cervical, lumbar spine (all obtained by Dr. Ronnie Derby in 2006)- showing mild DDD with bulges but without disc herniation, at multiple levels; mild facet joint dz, primarily at L3-4 and L4-5 bilaterally  . Chronic lower back pain   . Heartburn   . Hyperlipidemia   . Hypertension   . Left knee pain    s/p ACL reconstruction by Dr. Ronnie Derby  . Shoulder pain, bilateral    MRI right shoulder showing tear of infraspinatus tendon with bursal surface fraying of the infraspinatus    Past Surgical History:  Procedure Laterality Date  . ANTERIOR CRUCIATE LIGAMENT REPAIR Left    By Dr. Ronnie Derby  . CESAREAN SECTION  1986  . SHOULDER ARTHROSCOPY W/ ROTATOR CUFF REPAIR Right   . TUBAL LIGATION  1989    Current Outpatient Medications  Medication Sig Dispense Refill  . acetaminophen (TYLENOL) 325 MG tablet Take 2 tablets (650 mg total) by mouth every 6 (six) hours as needed for mild pain or moderate pain. Over the counter 30 tablet 3  . amLODipine (NORVASC) 5 MG tablet Take 1 tablet (5 mg total) by mouth daily. 30 tablet 3  . cetirizine (ZYRTEC) 10 MG tablet Take  1 tablet (10 mg total) by mouth daily. 30 tablet 11  . gabapentin (NEURONTIN) 300 MG capsule Take 300 mg by mouth at bedtime.  0  . HYDROcodone-acetaminophen (NORCO/VICODIN) 5-325 MG tablet Take 1 tablet by mouth 4 (four) times daily.  0  . lisinopril (PRINIVIL,ZESTRIL) 20 MG tablet Take 1 tablet (20 mg total) by mouth daily. 30 tablet 3  . naproxen (NAPROSYN) 250 MG tablet Take 1 tablet (250 mg total) by mouth 2 (two) times daily with a meal. X 5 days 10 tablet 0  . omeprazole (PRILOSEC) 20 MG capsule Take 2 capsules (40 mg total) by mouth daily. 60 capsule 0   No current facility-administered medications for this visit.     Allergies as of 01/12/2018  . (No Known Allergies)    Family History   Problem Relation Age of Onset  . Stroke Mother     Social History   Socioeconomic History  . Marital status: Married    Spouse name: Not on file  . Number of children: Not on file  . Years of education: Not on file  . Highest education level: Not on file  Occupational History  . Not on file  Social Needs  . Financial resource strain: Not on file  . Food insecurity:    Worry: Not on file    Inability: Not on file  . Transportation needs:    Medical: Not on file    Non-medical: Not on file  Tobacco Use  . Smoking status: Never Smoker  . Smokeless tobacco: Never Used  Substance and Sexual Activity  . Alcohol use: Yes    Alcohol/week: 24.0 standard drinks    Types: 24 Cans of beer per week  . Drug use: Not Currently  . Sexual activity: Not on file  Lifestyle  . Physical activity:    Days per week: Not on file    Minutes per session: Not on file  . Stress: Not on file  Relationships  . Social connections:    Talks on phone: Not on file    Gets together: Not on file    Attends religious service: Not on file    Active member of club or organization: Not on file    Attends meetings of clubs or organizations: Not on file    Relationship status: Not on file  . Intimate partner violence:    Fear of current or ex partner: Not on file    Emotionally abused: Not on file    Physically abused: Not on file    Forced sexual activity: Not on file  Other Topics Concern  . Not on file  Social History Narrative  . Not on file     Physical Exam: BP (!) 196/100   Pulse 78   Ht 4\' 11"  (1.499 m)   Wt 120 lb (54.4 kg)   LMP 12/25/2010   BMI 24.24 kg/m  Constitutional: generally well-appearing Psychiatric: alert and oriented x3 Eyes: extraocular movements intact Mouth: oral pharynx moist, no lesions Neck: supple no lymphadenopathy Cardiovascular: heart regular rate and rhythm Lungs: clear to auscultation bilaterally Abdomen: soft, nontender, nondistended, no obvious  ascites, no peritoneal signs, normal bowel sounds Extremities: no lower extremity edema bilaterally Skin: no lesions on visible extremities   Assessment and plan: 57 y.o. female with chronic GERD, dyspepsia, large hiatal hernia per outside imaging and recent chest x-ray, alcohol abuse, elevated liver tests  First I agree with Dr. Redmond Pulling.  I do not think he is having any obvious  symptoms from her known gallstone disease.  She does have some bloating and some GERD, some mild dyspepsia that may be related to her "large hiatal hernia" noted on recent chest x-ray.  I recommended endoscopic evaluation to better characterize that and to exclude other issues such as gastritis, H. pylori, peptic ulcer disease.  Her AST and ALT were slightly elevated last week.  I suspect this is from alcohol abuse.  Her alcohol level was extremely high when she was admitted last week.  She was at first a bit confused about what had happened but I tried to explain to her that it was felt that her combination of narcotics and alcohol led to her mental status changes.  I recommended she try to avoid that combination in the future.  Certainly getting off narcotics altogether would be safest for her.  She has not had any alcohol in about a week.  She will get a basic set of labs including a CBC, complete med about profile and coags and if her liver tests are still elevated then she would likely need the usual battery of blood test to exclude other causes and probably directed imaging of her liver as well.  She does not take proton pump inhibitor at the correct time in relation to eating meals and I corrected that going forward.    Please see the "Patient Instructions" section for addition details about the plan.   Owens Loffler, MD [facility] 01/12/2018, 8:59 AM  Cc: Greer Pickerel, MD

## 2018-02-04 ENCOUNTER — Other Ambulatory Visit: Payer: Self-pay

## 2018-02-04 ENCOUNTER — Ambulatory Visit (AMBULATORY_SURGERY_CENTER): Payer: Medicare Other | Admitting: Gastroenterology

## 2018-02-04 ENCOUNTER — Encounter: Payer: Self-pay | Admitting: Gastroenterology

## 2018-02-04 VITALS — BP 172/84 | HR 60 | Temp 97.7°F | Resp 11 | Ht <= 58 in | Wt 120.0 lb

## 2018-02-04 DIAGNOSIS — B9681 Helicobacter pylori [H. pylori] as the cause of diseases classified elsewhere: Secondary | ICD-10-CM

## 2018-02-04 DIAGNOSIS — K2970 Gastritis, unspecified, without bleeding: Secondary | ICD-10-CM

## 2018-02-04 DIAGNOSIS — K449 Diaphragmatic hernia without obstruction or gangrene: Secondary | ICD-10-CM | POA: Diagnosis not present

## 2018-02-04 DIAGNOSIS — R1310 Dysphagia, unspecified: Secondary | ICD-10-CM | POA: Diagnosis not present

## 2018-02-04 DIAGNOSIS — K2950 Unspecified chronic gastritis without bleeding: Secondary | ICD-10-CM | POA: Diagnosis not present

## 2018-02-04 DIAGNOSIS — K2900 Acute gastritis without bleeding: Secondary | ICD-10-CM

## 2018-02-04 MED ORDER — SODIUM CHLORIDE 0.9 % IV SOLN: 500.0000 mL | Freq: Once | INTRAVENOUS | Status: DC

## 2018-02-04 NOTE — Progress Notes (Signed)
PT taken to PACU. Monitors in place. VSS. Report given to RN. 

## 2018-02-04 NOTE — Op Note (Signed)
[facility] Patient Name: Toni Gutierrez Procedure Date: 02/04/2018 2:28 PM MRN: [id number] Endoscopist: Milus Banister , MD Age: 57 Referring MD:  Date of Birth: 07-31-1960 Gender: Female Account #: [id number] Procedure:                Upper GI endoscopy Indications:              Dyspepsia, Heartburn Medicines:                Monitored Anesthesia Care Procedure:                Pre-Anesthesia Assessment:                           - Prior to the procedure, a History and Physical                            was performed, and patient medications and                            allergies were reviewed. The patient's tolerance of                            previous anesthesia was also reviewed. The risks                            and benefits of the procedure and the sedation                            options and risks were discussed with the patient.                            All questions were answered, and informed consent                            was obtained. Prior Anticoagulants: The patient has                            taken no previous anticoagulant or antiplatelet                            agents. ASA Grade Assessment: II - A patient with                            mild systemic disease. After reviewing the risks                            and benefits, the patient was deemed in                            satisfactory condition to undergo the procedure.                           After obtaining informed consent, the endoscope was  passed under direct vision. Throughout the                            procedure, the patient's blood pressure, pulse, and                            oxygen saturations were monitored continuously. The                            Endoscope was introduced through the mouth, and                            advanced to the second part of duodenum. The upper                            GI endoscopy was accomplished  without difficulty.                            The patient tolerated the procedure well. Scope In: Scope Out: Findings:                 Large hiatal hernia; about 1/2 of the stomach                            resides in the chest. There appears to be a                            paraesophageal component as well.                           Mild inflammation characterized by erythema and                            friability was found in the gastric antrum.                            Biopsies were taken with a cold forceps for                            histology.                           The exam was otherwise without abnormality. Complications:            No immediate complications. Estimated blood loss:                            None. Estimated Blood Loss:     Estimated blood loss: none. Impression:               - Gastritis, biopsied to check for H. pylori.                           - Large hiatal hernia.                           -  The examination was otherwise normal. Recommendation:           - Patient has a contact number available for                            emergencies. The signs and symptoms of potential                            delayed complications were discussed with the                            patient. Return to normal activities tomorrow.                            Written discharge instructions were provided to the                            patient.                           - Resume previous diet.                           - Continue present medications.                           - Await pathology results. Following path review,                            you will likely be set up for an UGI evaluation to                            better characterize the large hiatal hernia,                            confirm if there is a paraesopahgeal component. The                            hernia is large enough that it likely at least                             contributing to your reflex and dyspepsia. Milus Banister, MD 02/04/2018 3:08:31 PM This report has been signed electronically.

## 2018-02-04 NOTE — Progress Notes (Signed)
History reviewed today 

## 2018-02-04 NOTE — Patient Instructions (Signed)
YOU HAD AN ENDOSCOPIC PROCEDURE TODAY AT [facility]:   Refer to the procedure report that was given to you for any specific questions about what was found during the examination.  If the procedure report does not answer your questions, please call your gastroenterologist to clarify.  If you requested that your care partner not be given the details of your procedure findings, then the procedure report has been included in a sealed envelope for you to review at your convenience later.  YOU SHOULD EXPECT: Some feelings of bloating in the abdomen. Passage of more gas than usual.  Walking can help get rid of the air that was put into your GI tract during the procedure and reduce the bloating. If you had a lower endoscopy (such as a colonoscopy or flexible sigmoidoscopy) you may notice spotting of blood in your stool or on the toilet paper. If you underwent a bowel prep for your procedure, you may not have a normal bowel movement for a few days.  Please Note:  You might notice some irritation and congestion in your nose or some drainage.  This is from the oxygen used during your procedure.  There is no need for concern and it should clear up in a day or so.  SYMPTOMS TO REPORT IMMEDIATELY:   Following lower endoscopy (colonoscopy or flexible sigmoidoscopy):  Excessive amounts of blood in the stool  Significant tenderness or worsening of abdominal pains  Swelling of the abdomen that is new, acute  Fever of 100F or higher   Following upper endoscopy (EGD)  Vomiting of blood or coffee ground material  New chest pain or pain under the shoulder blades  Painful or persistently difficult swallowing  New shortness of breath  Fever of 100F or higher  Black, tarry-looking stools  For urgent or emergent issues, a gastroenterologist can be reached at any hour by calling [phone].   DIET:  We do recommend a small meal at first, but then you may proceed to your regular diet.  Drink  plenty of fluids but you should avoid alcoholic beverages for 24 hours.  ACTIVITY:  You should plan to take it easy for the rest of today and you should NOT DRIVE or use heavy machinery until tomorrow (because of the sedation medicines used during the test).    FOLLOW UP: Our staff will call the number listed on your records the next business day following your procedure to check on you and address any questions or concerns that you may have regarding the information given to you following your procedure. If we do not reach you, we will leave a message.  However, if you are feeling well and you are not experiencing any problems, there is no need to return our call.  We will assume that you have returned to your regular daily activities without incident.  If any biopsies were taken you will be contacted by phone or by letter within the next 1-3 weeks.  Please call us at [phone] if you have not heard about the biopsies in 3 weeks.    SIGNATURES/CONFIDENTIALITY: You and/or your care partner have signed paperwork which will be entered into your electronic medical record.  These signatures attest to the fact that that the information above on your After Visit Summary has been reviewed and is understood.  Full responsibility of the confidentiality of this discharge information lies with you and/or your care-partner.  Gastritis and hiatal hernia information given.

## 2018-02-04 NOTE — Progress Notes (Signed)
Called to room to assist during endoscopic procedure.  Patient ID and intended procedure confirmed with present staff. Received instructions for my participation in the procedure from the performing physician.  

## 2018-02-07 ENCOUNTER — Telehealth: Payer: Self-pay

## 2018-02-07 NOTE — Telephone Encounter (Signed)
  Follow up Call-  Call back number 02/04/2018  Post procedure Call Back phone  # [phone]  Permission to leave phone message Yes  Some recent data might be hidden     Patient questions:  Do you have a fever, pain , or abdominal swelling? No. Pain Score  0 *  Have you tolerated food without any problems? Yes.    Have you been able to return to your normal activities? Yes.    Do you have any questions about your discharge instructions: Diet   No. Medications  No. Follow up visit  No.  Do you have questions or concerns about your Care? No.  Actions: * If pain score is 4 or above: No action needed, pain <4.

## 2018-02-07 NOTE — Telephone Encounter (Signed)
Attempted to reach pt. With follow-up call following endoscopic procedure 02/04/2018.  LM on pt voice mail.  Will try to reach pt. Again later today.

## 2018-02-14 ENCOUNTER — Other Ambulatory Visit: Payer: Self-pay

## 2018-02-14 ENCOUNTER — Telehealth: Payer: Self-pay | Admitting: Gastroenterology

## 2018-02-14 MED ORDER — BIS SUBCIT-METRONID-TETRACYC 140-125-125 MG PO CAPS: 3.0000 | ORAL_CAPSULE | Freq: Three times a day (TID) | ORAL | 0 refills | Status: DC

## 2018-02-14 NOTE — Telephone Encounter (Signed)
Pt called to ask for an alternative pscrp to Pylera -- med too expensive.

## 2018-02-15 MED ORDER — TETRACYCLINE HCL 500 MG PO CAPS: 500.0000 mg | ORAL_CAPSULE | Freq: Four times a day (QID) | ORAL | 0 refills | Status: AC

## 2018-02-15 MED ORDER — BISMUTH SUBSALICYLATE 262 MG PO TABS: 2.0000 | ORAL_TABLET | Freq: Four times a day (QID) | ORAL | 0 refills | Status: AC

## 2018-02-15 MED ORDER — METRONIDAZOLE 500 MG PO TABS: 500.0000 mg | ORAL_TABLET | Freq: Four times a day (QID) | ORAL | 0 refills | Status: AC

## 2018-02-15 NOTE — Telephone Encounter (Signed)
Rxs sent to pharmacy. Pt made aware that she needed to take Omeprazole 20 mg twice a day at least 12 hours apart. Instructed patient to call after treatment is complete if she is not feeling better.

## 2018-02-15 NOTE — Telephone Encounter (Signed)
   Yes, as follows: Bismuth subsalicylate 262mg  pills, two pills QID; Metronidazoe 500mg  pill,one pill QID; Tetracycline 500mg  pill, one pill QID.  At the same time they need to be on twice daily PPI.  If currently only on once daily PPI then they should supplement with OTC omeprazole 20mg  pill daily as well (12 hours after the first PPI dose).

## 2018-02-15 NOTE — Telephone Encounter (Signed)
Dr. Ardis Hughs,   [facility] to give this patient the antibiotic breakdown of Pylera? Please advise

## 2018-03-02 ENCOUNTER — Ambulatory Visit: Payer: Medicare Other | Admitting: Nurse Practitioner

## 2018-03-04 ENCOUNTER — Encounter: Payer: Self-pay | Admitting: Nurse Practitioner

## 2018-03-04 ENCOUNTER — Encounter (INDEPENDENT_AMBULATORY_CARE_PROVIDER_SITE_OTHER): Payer: Self-pay

## 2018-03-04 ENCOUNTER — Ambulatory Visit (INDEPENDENT_AMBULATORY_CARE_PROVIDER_SITE_OTHER): Payer: Medicare Other | Admitting: Nurse Practitioner

## 2018-03-04 VITALS — BP 140/78 | HR 67 | Ht 59.0 in | Wt 121.0 lb

## 2018-03-04 DIAGNOSIS — R109 Unspecified abdominal pain: Secondary | ICD-10-CM | POA: Diagnosis not present

## 2018-03-04 DIAGNOSIS — B9681 Helicobacter pylori [H. pylori] as the cause of diseases classified elsewhere: Secondary | ICD-10-CM | POA: Diagnosis not present

## 2018-03-04 DIAGNOSIS — K2970 Gastritis, unspecified, without bleeding: Secondary | ICD-10-CM | POA: Diagnosis not present

## 2018-03-04 DIAGNOSIS — K449 Diaphragmatic hernia without obstruction or gangrene: Secondary | ICD-10-CM

## 2018-03-04 NOTE — Progress Notes (Signed)
Chief Complaint:   Right sided abdominal pain.    IMPRESSION and PLAN:     2.  58 year old female with a large hiatal hernia with portion of stomach in chest and ? Paraesophageal component on EGD last month.  -Obtain UGI to further characterize the hiatal hernia.   2. Right mid abdominal pain, new since we saw her in November .  Known cholelithiasis previously evaluated by [facility]. Whatever pain she was having at the time was not felt to be gallbladder related.  -The reportedly new right mid abdominal pain is movement / activity related , making it unlikely to be secondary to gallbladder disease or the large hiatal hernia but will see what UGI shows.   3. H.pylori related gastritis on EGD 12.13.19. She completed quadruple therapy yesterday. Can check breath test for eradication at some point. Right now I am reluctant to hold PPI for the test  4. Hx of ETOH abuse / abnormal liver tests. No ETOH since hospitalized with altered mental status felt to be from combination of ETOH and narcotics. No etoh since hospital discharge. Follow up liver tests 01/12/18 were normal.    HPI:     Patient is a 58 year old female history of alcoholism, GERD, dyspepsia, cholelithiasis who was seen by Dr. Ardis Hughs late November 2019 after a chest x-ray showed a large hiatal hernia.  Patient had been seen at [facility] for evaluation of abdominal pain and gallstones.  Cholelithiasis was not felt to be the source of abdominal discomfort  For further evaluation we proceeded with upper endoscopy which showed a large hiatal hernia with about one half of the stomach residing in the chest.  There appeared to be a paraesophageal component as well.  There was mild inflammation, erythema and friability in the gastric antrum. Gastric biopsies compatible with mildly chronic active H. pylori gastritis.  Quadruple therapy described, she completed antibiotics yesterday.  Patient comes in with complaints of right  mid abdominal pain now.  She says this pain started several weeks ago, she was not having any abdominal pain when she saw Dr. Ardis Hughs in November for evaluation of the large hiatal hernia the pain is intermittent, she notices it when walking, twisting, bending.  Pain does not radiate through to her back, it has no relationship to food.  She did have some nausea and vomiting last weekend, that has resolved.  Also moving well on stool softeners.  She is not had alcohol in several weeks.  LFTs November 20th were normal    Review of systems:     No chest pain, no SOB, no fevers, no urinary sx   Past Medical History:  Diagnosis Date  . Alcohol abuse    No alcohol intake since 2001  . Allergic rhinitis   . Anemia   . Arthritis    "back, hands" (01/06/2018)  . Carpal tunnel syndrome, right   . Chronic headache    Thought to be 2/2 cervical DJD vs anxiety. CT head in 2010 without any significant findings  . Chronic lower back pain    MRI of cervical, lumbar spine (all obtained by Dr. Ronnie Derby in 2006)- showing mild DDD with bulges but without disc herniation, at multiple levels; mild facet joint dz, primarily at L3-4 and L4-5 bilaterally  . Chronic lower back pain   . Heartburn   . Hyperlipidemia   . Hypertension   . Left knee pain    s/p ACL reconstruction by Dr. Ronnie Derby  .  Shoulder pain, bilateral    MRI right shoulder showing tear of infraspinatus tendon with bursal surface fraying of the infraspinatus    Patient's surgical history, family medical history, social history, medications and allergies were all reviewed in Epic   Creatinine clearance cannot be calculated (Patient's most recent lab result is older than the maximum 21 days allowed.)  Current Outpatient Medications  Medication Sig Dispense Refill  . docusate sodium (COLACE) 100 MG capsule Take 100 mg by mouth 2 (two) times daily.    . ferrous sulfate 325 (65 FE) MG tablet Take 325 mg by mouth 2 (two) times daily with a meal.    .  gabapentin (NEURONTIN) 300 MG capsule Take 300 mg by mouth at bedtime.  0  . HYDROcodone-acetaminophen (NORCO/VICODIN) 5-325 MG tablet Take 1 tablet by mouth 4 (four) times daily.  0  . lisinopril (PRINIVIL,ZESTRIL) 20 MG tablet Take 1 tablet (20 mg total) by mouth daily. 30 tablet 3  . omeprazole (PRILOSEC) 20 MG capsule Take 2 capsules (40 mg total) by mouth daily. 60 capsule 0   No current facility-administered medications for this visit.     Physical Exam:     BP 140/78   Pulse 67   Ht 4\' 11"  (1.499 m)   Wt 121 lb (54.9 kg)   LMP 12/25/2010   BMI 24.44 kg/m   GENERAL:  Pleasant female in NAD PSYCH: : Cooperative, normal affect EENT:  conjunctiva no murmur heard, no peripheral edema PULM: Normal respiratory effort, lungs CTA bilaterally, no wheezing ABDOMEN:  Nondistended, soft, nontender. No obvious masses, no hepatomegaly,  normal bowel sounds SKIN:  turgor, no lesions seen Musculoskeletal:  Normal muscle tone, normal strength NEURO: Alert and oriented x 3, no focal neurologic deficits   Tye Savoy , NP 03/04/2018, 3:05 PM

## 2018-03-04 NOTE — Patient Instructions (Signed)
If you are age 59 or older, your body mass index should be between 23-30. Your Body mass index is 24.44 kg/m. If this is out of the aforementioned range listed, please consider follow up with your Primary Care Provider.  If you are age 36 or younger, your body mass index should be between 19-25. Your Body mass index is 24.44 kg/m. If this is out of the aformentioned range listed, please consider follow up with your Primary Care Provider.   You have been scheduled for an Upper GI Series at [facility]. Your appointment is on 1./20/20 at 11 am. Please arrive 15 minutes prior to your test for registration. Make sure not to eat or drink anything after midnight on the night before your test. If you need to reschedule, please call radiology at [phone]. ________________________________________________________________ An upper GI series uses x rays to help diagnose problems of the upper GI tract, which includes the esophagus, stomach, and duodenum. The duodenum is the first part of the small intestine. An upper GI series is conducted by a radiology technologist or a radiologist-a doctor who specializes in x-ray imaging-at a hospital or outpatient center. While sitting or standing in front of an x-ray machine, the patient drinks barium liquid, which is often white and has a chalky consistency and taste. The barium liquid coats the lining of the upper GI tract and makes signs of disease show up more clearly on x rays. X-ray video, called fluoroscopy, is used to view the barium liquid moving through the esophagus, stomach, and duodenum. Additional x rays and fluoroscopy are performed while the patient lies on an x-ray table. To fully coat the upper GI tract with barium liquid, the technologist or radiologist may press on the abdomen or ask the patient to change position. Patients hold still in various positions, allowing the technologist or radiologist to take x rays of the upper GI tract at different  angles. If a technologist conducts the upper GI series, a radiologist will later examine the images to look for problems.  This test typically takes about 1 hour to complete. ________________________________________________________________  We will call you with results.  Thank you for choosing me and [facility].   Tye Savoy, NP

## 2018-03-07 NOTE — Progress Notes (Signed)
I agree with the above note, plan 

## 2018-03-14 ENCOUNTER — Ambulatory Visit (HOSPITAL_COMMUNITY)
Admission: RE | Admit: 2018-03-14 | Discharge: 2018-03-14 | Disposition: A | Discharge status: Home / Self Care | Payer: Medicare Other | Source: Ambulatory Visit | Attending: Nurse Practitioner | Admitting: Nurse Practitioner

## 2018-03-14 DIAGNOSIS — K2970 Gastritis, unspecified, without bleeding: Secondary | ICD-10-CM

## 2018-03-14 DIAGNOSIS — B9681 Helicobacter pylori [H. pylori] as the cause of diseases classified elsewhere: Secondary | ICD-10-CM | POA: Insufficient documentation

## 2018-03-14 DIAGNOSIS — R109 Unspecified abdominal pain: Secondary | ICD-10-CM

## 2018-03-14 DIAGNOSIS — K449 Diaphragmatic hernia without obstruction or gangrene: Secondary | ICD-10-CM

## 2018-03-18 ENCOUNTER — Ambulatory Visit
Admission: RE | Admit: 2018-03-18 | Discharge: 2018-03-18 | Disposition: A | Discharge status: Home / Self Care | Payer: Medicare Other | Source: Ambulatory Visit | Attending: Physician Assistant | Admitting: Physician Assistant

## 2018-03-18 DIAGNOSIS — Z1231 Encounter for screening mammogram for malignant neoplasm of breast: Secondary | ICD-10-CM

## 2018-03-22 ENCOUNTER — Ambulatory Visit (HOSPITAL_COMMUNITY)
Admission: RE | Admit: 2018-03-22 | Discharge: 2018-03-22 | Disposition: A | Discharge status: Home / Self Care | Payer: Medicare Other | Source: Ambulatory Visit | Attending: Nurse Practitioner | Admitting: Nurse Practitioner

## 2018-03-22 ENCOUNTER — Other Ambulatory Visit: Payer: Self-pay | Admitting: Nurse Practitioner

## 2018-03-22 DIAGNOSIS — K2970 Gastritis, unspecified, without bleeding: Secondary | ICD-10-CM | POA: Diagnosis present

## 2018-03-22 DIAGNOSIS — B9681 Helicobacter pylori [H. pylori] as the cause of diseases classified elsewhere: Secondary | ICD-10-CM

## 2018-03-22 DIAGNOSIS — K449 Diaphragmatic hernia without obstruction or gangrene: Secondary | ICD-10-CM | POA: Diagnosis present

## 2018-03-22 DIAGNOSIS — R109 Unspecified abdominal pain: Secondary | ICD-10-CM

## 2018-03-24 ENCOUNTER — Telehealth: Payer: Self-pay

## 2018-03-24 NOTE — Telephone Encounter (Signed)
Records and request for an appointment faxed to [facility]. Dx abdominal pain        cholithiasis and hiatal hernia

## 2018-03-29 NOTE — Telephone Encounter (Signed)
-----   Message from [name], NP sent at 03/23/2018  3:59 PM EST ----- [name],  Please make an appt for patient with [facility] for consideration of [name] repair and if done then consider cholecystectomy as well. Toni Gutierrez is already a patient at [facility], they have seen her for abdominal pain. A little background info:   Toni Gutierrez has large [place] on EGD and UGI series. Also, when I saw her in clinic Toni Gutierrez had right abdominal pain. It may be MSK pain but Toni Gutierrez does have cholelithiasis.   Thanks

## 2018-03-31 ENCOUNTER — Other Ambulatory Visit: Payer: Self-pay

## 2018-03-31 DIAGNOSIS — R109 Unspecified abdominal pain: Secondary | ICD-10-CM

## 2018-05-11 ENCOUNTER — Telehealth: Payer: Self-pay | Admitting: Gastroenterology

## 2018-05-11 NOTE — Telephone Encounter (Signed)
Spoke with the patient. She is scheduled with [facility] surgeon 05/19/18 for evaluation. She reports she had loose stools last week. She takes a stool softeners. Yesterday and today she has not had any bowel movement issues, stating it is normal. She is not in pain at this moment.  She is on Prilosec. She has seen some dark stools. She is taking iron supplement.  She states she is batting high cholesterol, low potassium and her wrist really hurts at times. Redirected back to any GI issues. She says she is fine right now and will keep the appointment with the surgeon. I will call her again tomorrow to be certain she is still pain free. Please advise on any thoughts.

## 2018-05-11 NOTE — Telephone Encounter (Signed)
Patient called stating she has alternating right and left abdominal pain that has been ongoing over the past week. She had imaging done last month and a consult was ordered for [facility] for [facility] repair and to consider Cholecystectomy.Patient has not heard from [facility] for appointment. [name] is trying to contact [facility] on the referral as she received your initial orders. Paula,please forward your recommendations to [facility] as I will be out of the office.

## 2018-05-11 NOTE — Telephone Encounter (Signed)
Pt is sched for OV 4.22.20 and reported severe abd p.  Pt would like to know if she can take any drug to alleviate pain.

## 2018-05-12 NOTE — Telephone Encounter (Signed)
I agree with all you wrote.

## 2018-05-13 NOTE — Telephone Encounter (Signed)
Patient says she is doing "just fine, I am okay."

## 2018-06-15 ENCOUNTER — Ambulatory Visit: Payer: Medicare Other | Admitting: Gastroenterology

## 2018-07-19 ENCOUNTER — Other Ambulatory Visit: Payer: Self-pay

## 2018-07-19 ENCOUNTER — Ambulatory Visit (INDEPENDENT_AMBULATORY_CARE_PROVIDER_SITE_OTHER): Payer: Medicare Other | Admitting: Gastroenterology

## 2018-07-19 VITALS — Ht 59.0 in | Wt 124.0 lb

## 2018-07-19 DIAGNOSIS — K8020 Calculus of gallbladder without cholecystitis without obstruction: Secondary | ICD-10-CM | POA: Diagnosis not present

## 2018-07-19 DIAGNOSIS — K449 Diaphragmatic hernia without obstruction or gangrene: Secondary | ICD-10-CM

## 2018-07-19 NOTE — Patient Instructions (Addendum)
We will try to help her coordinate an appointment with [facility] surgery, Dr. Greer Pickerel.  He has seen her before and I would like him to consider cholecystectomy and same time repair of large hiatal  hernia   Thank you for entrusting me with your care and choosing [facility].  Dr Ardis Hughs

## 2018-07-19 NOTE — Progress Notes (Signed)
Review of pertinent gastrointestinal problems: 1.  Large hiatal hernia; EGD December 2019 for dyspepsia, GERD.  About one half of the stomach resides in the chest and there appeared to be a paraesophageal component as well.  There was mild gastritis, biopsies were positive for H. pylori.  She was given a 14-day course of Pylera.  Upper GI January 2020 showed a moderately large hiatal hernia.  Prominent reflux up to the thoracic esophagus was noted. 2.  Cholelithiasis; possibly symptomatic   This service was provided via virtual visit.  Only audio was used.  The patient was located at home.  I was located in my office.  The patient did consent to this virtual visit and is aware of possible charges through their insurance for this visit.  The patient is an established patient.  My certified medical assistant, Grace Bushy, contributed to this visit by contacting the patient by phone 1 or 2 business days prior to the appointment and also followed up on the recommendations I made after the visit.  Time spent on virtual visit: 22 minutes   HPI: This is a pleasant 58 year old woman whom I last saw at the time of an upper endoscopy about 6 months ago.  She was seen here in our office more recently than that by [facility].  After the upper endoscopy she completed a two-week course of Pylera for H. pylori.  Her pains never improved.  She had an upper GI that confirmed the large hiatal hernia and also free reflux up to her thoracic esophagus.  She has pains on both the right and left sides of her abdomen.    We had arranged for her to meet back with [facility] surgery, I do not think she ever went for that appointment, possibly due to the coronavirus restrictions.  Her pains are right-sided and also left-sided in her abdomen.  They are intermittent.  They can be associated with a lot of nausea and vomiting and sometimes not.     Chief complaint is hiatal hernia, gallstones  ROS: complete GI ROS as  described in HPI, all other review negative.  Constitutional:  No unintentional weight loss   Past Medical History:  Diagnosis Date  . Alcohol abuse    No alcohol intake since 2001  . Allergic rhinitis   . Anemia   . Arthritis    "back, hands" (01/06/2018)  . Carpal tunnel syndrome, right   . Chronic headache    Thought to be 2/2 cervical DJD vs anxiety. CT head in 2010 without any significant findings  . Chronic lower back pain    MRI of cervical, lumbar spine (all obtained by Dr. Ronnie Derby in 2006)- showing mild DDD with bulges but without disc herniation, at multiple levels; mild facet joint dz, primarily at L3-4 and L4-5 bilaterally  . Chronic lower back pain   . Heartburn   . Hyperlipidemia   . Hypertension   . Left knee pain    s/p ACL reconstruction by Dr. Ronnie Derby  . Shoulder pain, bilateral    MRI right shoulder showing tear of infraspinatus tendon with bursal surface fraying of the infraspinatus    Past Surgical History:  Procedure Laterality Date  . ANTERIOR CRUCIATE LIGAMENT REPAIR Left    By Dr. Ronnie Derby  . CESAREAN SECTION  1986  . SHOULDER ARTHROSCOPY W/ ROTATOR CUFF REPAIR Right   . TUBAL LIGATION  1989    Current Outpatient Medications  Medication Sig Dispense Refill  . docusate sodium (COLACE) 100 MG  capsule Take 100 mg by mouth 2 (two) times daily.    . ferrous sulfate 325 (65 FE) MG tablet Take 325 mg by mouth 2 (two) times daily with a meal.    . gabapentin (NEURONTIN) 300 MG capsule Take 300 mg by mouth at bedtime.  0  . HYDROcodone-acetaminophen (NORCO/VICODIN) 5-325 MG tablet Take 1 tablet by mouth 4 (four) times daily.  0  . lisinopril (PRINIVIL,ZESTRIL) 20 MG tablet Take 1 tablet (20 mg total) by mouth daily. 30 tablet 3  . omeprazole (PRILOSEC) 20 MG capsule Take 2 capsules (40 mg total) by mouth daily. 60 capsule 0   No current facility-administered medications for this visit.     Allergies as of 07/19/2018  . (No Known Allergies)    Family  History  Problem Relation Age of Onset  . Stroke Mother   . Colon cancer Neg Hx   . Esophageal cancer Neg Hx   . Rectal cancer Neg Hx   . Stomach cancer Neg Hx     Social History   Socioeconomic History  . Marital status: Married    Spouse name: Not on file  . Number of children: Not on file  . Years of education: Not on file  . Highest education level: Not on file  Occupational History  . Not on file  Social Needs  . Financial resource strain: Not on file  . Food insecurity:    Worry: Not on file    Inability: Not on file  . Transportation needs:    Medical: Not on file    Non-medical: Not on file  Tobacco Use  . Smoking status: Never Smoker  . Smokeless tobacco: Never Used  Substance and Sexual Activity  . Alcohol use: Yes    Alcohol/week: 24.0 standard drinks    Types: 24 Cans of beer per week    Comment: stopped one month ago  . Drug use: Not Currently  . Sexual activity: Not on file  Lifestyle  . Physical activity:    Days per week: Not on file    Minutes per session: Not on file  . Stress: Not on file  Relationships  . Social connections:    Talks on phone: Not on file    Gets together: Not on file    Attends religious service: Not on file    Active member of club or organization: Not on file    Attends meetings of clubs or organizations: Not on file    Relationship status: Not on file  . Intimate partner violence:    Fear of current or ex partner: Not on file    Emotionally abused: Not on file    Physically abused: Not on file    Forced sexual activity: Not on file  Other Topics Concern  . Not on file  Social History Narrative  . Not on file     Physical Exam: Unable to perform because this was a "telemed visit" due to current Covid-19 pandemic  Assessment and plan: 58 y.o. female with large hiatal hernia, likely symptomatic gallstones  I think she very likely has symptoms from both the large hiatal hernia and also gallstones.  Probably the  hiatal hernia symptoms predominate.  She is a bit scattered in her organization and thought processes.  We will try to help her coordinate an appointment again with [facility] surgery so that they can consider repair of both a hiatal hernia and same time cholecystectomy.  Please see the "Patient Instructions" section  for addition details about the plan.  Owens Loffler, MD [facility] 07/19/2018, 3:45 PM

## 2018-08-03 ENCOUNTER — Other Ambulatory Visit: Payer: Self-pay | Admitting: General Surgery

## 2018-08-03 DIAGNOSIS — K449 Diaphragmatic hernia without obstruction or gangrene: Secondary | ICD-10-CM

## 2018-08-05 ENCOUNTER — Ambulatory Visit
Admission: RE | Admit: 2018-08-05 | Discharge: 2018-08-05 | Disposition: A | Discharge status: Home / Self Care | Payer: Medicare Other | Source: Ambulatory Visit | Attending: General Surgery | Admitting: General Surgery

## 2018-08-05 ENCOUNTER — Encounter: Payer: Self-pay | Admitting: Radiology

## 2018-08-05 DIAGNOSIS — K449 Diaphragmatic hernia without obstruction or gangrene: Secondary | ICD-10-CM

## 2018-08-05 MED ORDER — IOPAMIDOL (ISOVUE-300) INJECTION 61%
100.0000 mL | Freq: Once | INTRAVENOUS | Status: AC | PRN
  Administered 2018-08-05: 100 mL via INTRAVENOUS

## 2019-02-03 ENCOUNTER — Encounter (HOSPITAL_BASED_OUTPATIENT_CLINIC_OR_DEPARTMENT_OTHER): Payer: Self-pay | Admitting: Orthopedic Surgery

## 2019-02-03 ENCOUNTER — Other Ambulatory Visit: Payer: Self-pay

## 2019-02-03 NOTE — Progress Notes (Signed)
Spoke w/ via phone for pre-op interview---[facility] Lab needs dos----   Pt, cbc, cmet, ekg            Lab results------ COVID test ------02-04-2019 Arrive at -------900 NPO after ------midnight Medications to take morning of surgery -----omeprazole, hydrocodone prn Diabetic medication -----n/a Patient Special Instructions ----- Pre-Op special Istructions ----- Patient verbalized understanding of instructions that were given at this phone interview. Patient denies shortness of breath, chest pain, fever, cough a this phone interview.

## 2019-02-04 ENCOUNTER — Other Ambulatory Visit (HOSPITAL_COMMUNITY)
Admission: RE | Admit: 2019-02-04 | Discharge: 2019-02-04 | Disposition: A | Discharge status: Home / Self Care | Payer: Medicare Other | Source: Ambulatory Visit | Attending: Orthopedic Surgery | Admitting: Orthopedic Surgery

## 2019-02-04 DIAGNOSIS — Z01812 Encounter for preprocedural laboratory examination: Secondary | ICD-10-CM | POA: Diagnosis present

## 2019-02-04 DIAGNOSIS — Z20828 Contact with and (suspected) exposure to other viral communicable diseases: Secondary | ICD-10-CM | POA: Diagnosis not present

## 2019-02-05 LAB — NOVEL CORONAVIRUS, NAA (HOSP ORDER, SEND-OUT TO REF LAB; TAT 18-24 HRS): SARS-CoV-2, NAA: NOT DETECTED

## 2019-02-08 ENCOUNTER — Encounter (HOSPITAL_BASED_OUTPATIENT_CLINIC_OR_DEPARTMENT_OTHER)
Admission: RE | Disposition: A | Discharge status: Home / Self Care | Payer: Self-pay | Source: Home / Self Care | Attending: Orthopedic Surgery

## 2019-02-08 ENCOUNTER — Ambulatory Visit (HOSPITAL_BASED_OUTPATIENT_CLINIC_OR_DEPARTMENT_OTHER): Payer: Medicare Other | Admitting: Anesthesiology

## 2019-02-08 ENCOUNTER — Other Ambulatory Visit: Payer: Self-pay

## 2019-02-08 ENCOUNTER — Encounter (HOSPITAL_BASED_OUTPATIENT_CLINIC_OR_DEPARTMENT_OTHER): Payer: Self-pay | Admitting: Orthopedic Surgery

## 2019-02-08 ENCOUNTER — Ambulatory Visit (HOSPITAL_BASED_OUTPATIENT_CLINIC_OR_DEPARTMENT_OTHER)
Admission: RE | Admit: 2019-02-08 | Discharge: 2019-02-08 | Disposition: A | Discharge status: Home / Self Care | Payer: Medicare Other | Attending: Orthopedic Surgery | Admitting: Orthopedic Surgery

## 2019-02-08 DIAGNOSIS — I10 Essential (primary) hypertension: Secondary | ICD-10-CM | POA: Insufficient documentation

## 2019-02-08 DIAGNOSIS — Z79899 Other long term (current) drug therapy: Secondary | ICD-10-CM | POA: Insufficient documentation

## 2019-02-08 DIAGNOSIS — G5601 Carpal tunnel syndrome, right upper limb: Secondary | ICD-10-CM | POA: Diagnosis present

## 2019-02-08 DIAGNOSIS — D649 Anemia, unspecified: Secondary | ICD-10-CM | POA: Insufficient documentation

## 2019-02-08 HISTORY — PX: CARPAL TUNNEL RELEASE: SHX101

## 2019-02-08 LAB — COMPREHENSIVE METABOLIC PANEL
ALT: 18 U/L (ref 0–44)
AST: 18 U/L (ref 15–41)
Albumin: 3.8 g/dL (ref 3.5–5.0)
Alkaline Phosphatase: 81 U/L (ref 38–126)
Anion gap: 9 (ref 5–15)
BUN: 11 mg/dL (ref 6–20)
CO2: 24 mmol/L (ref 22–32)
Calcium: 9 mg/dL (ref 8.9–10.3)
Chloride: 110 mmol/L (ref 98–111)
Creatinine, Ser: 0.65 mg/dL (ref 0.44–1.00)
GFR calc Af Amer: >60 mL/min (ref >60)
GFR calc non Af Amer: >60 mL/min (ref >60)
Glucose, Bld: 111 mg/dL — ABNORMAL HIGH (ref 70–99)
Potassium: 3.4 mmol/L — ABNORMAL LOW (ref 3.5–5.1)
Sodium: 143 mmol/L (ref 135–145)
Total Bilirubin: 0.3 mg/dL (ref 0.3–1.2)
Total Protein: 7.2 g/dL (ref 6.5–8.1)

## 2019-02-08 LAB — CBC WITH DIFFERENTIAL/PLATELET
Abs Immature Granulocytes: 0.01 10*3/uL (ref 0.00–0.07)
Basophils Absolute: 0.1 10*3/uL (ref 0.0–0.1)
Basophils Relative: 1 %
Eosinophils Absolute: 0.4 10*3/uL (ref 0.0–0.5)
Eosinophils Relative: 7 %
HCT: 39.7 % (ref 36.0–46.0)
Hemoglobin: 13.1 g/dL (ref 12.0–15.0)
Immature Granulocytes: 0 %
Lymphocytes Relative: 38 %
Lymphs Abs: 2 10*3/uL (ref 0.7–4.0)
MCH: 30 pg (ref 26.0–34.0)
MCHC: 33 g/dL (ref 30.0–36.0)
MCV: 90.8 fL (ref 80.0–100.0)
Monocytes Absolute: 0.3 10*3/uL (ref 0.1–1.0)
Monocytes Relative: 5 %
Neutro Abs: 2.7 10*3/uL (ref 1.7–7.7)
Neutrophils Relative %: 49 %
Platelets: 288 10*3/uL (ref 150–400)
RBC: 4.37 MIL/uL (ref 3.87–5.11)
RDW: 12.7 % (ref 11.5–15.5)
WBC: 5.4 10*3/uL (ref 4.0–10.5)
nRBC: 0 % (ref 0.0–0.2)

## 2019-02-08 LAB — PROTIME-INR
INR: 1 (ref 0.8–1.2)
Prothrombin Time: 12.6 seconds (ref 11.4–15.2)

## 2019-02-08 SURGERY — CARPAL TUNNEL RELEASE
Anesthesia: General | Site: Arm Lower | Laterality: Right

## 2019-02-08 MED ORDER — METOCLOPRAMIDE HCL 5 MG/ML IJ SOLN
5.0000 mg | Freq: Three times a day (TID) | INTRAMUSCULAR | Status: DC | PRN
  Filled 2019-02-08: qty 2

## 2019-02-08 MED ORDER — OXYCODONE HCL 5 MG PO TABS
ORAL_TABLET | ORAL | Status: AC
  Filled 2019-02-08: qty 1

## 2019-02-08 MED ORDER — DOCUSATE SODIUM 100 MG PO CAPS
100.0000 mg | ORAL_CAPSULE | Freq: Two times a day (BID) | ORAL | Status: DC
  Filled 2019-02-08: qty 1

## 2019-02-08 MED ORDER — PROPOFOL 10 MG/ML IV BOLUS
INTRAVENOUS | Status: AC
  Filled 2019-02-08: qty 20

## 2019-02-08 MED ORDER — ONDANSETRON HCL 4 MG PO TABS
4.0000 mg | ORAL_TABLET | Freq: Four times a day (QID) | ORAL | Status: DC | PRN
  Filled 2019-02-08: qty 1

## 2019-02-08 MED ORDER — ACETAMINOPHEN 500 MG PO TABS
500.0000 mg | ORAL_TABLET | Freq: Four times a day (QID) | ORAL | Status: DC
  Filled 2019-02-08: qty 1

## 2019-02-08 MED ORDER — OXYCODONE HCL 5 MG/5ML PO SOLN
5.0000 mg | Freq: Once | ORAL | Status: AC | PRN
  Filled 2019-02-08: qty 5

## 2019-02-08 MED ORDER — KETOROLAC TROMETHAMINE 30 MG/ML IJ SOLN
INTRAMUSCULAR | Status: AC
  Filled 2019-02-08: qty 1

## 2019-02-08 MED ORDER — ACETAMINOPHEN 10 MG/ML IV SOLN
1000.0000 mg | Freq: Four times a day (QID) | INTRAVENOUS | Status: DC
  Administered 2019-02-08: 13:00:00 1000 mg via INTRAVENOUS
  Filled 2019-02-08: qty 100

## 2019-02-08 MED ORDER — CEFAZOLIN SODIUM-DEXTROSE 2-4 GM/100ML-% IV SOLN
INTRAVENOUS | Status: AC
  Filled 2019-02-08: qty 100

## 2019-02-08 MED ORDER — LIDOCAINE 2% (20 MG/ML) 5 ML SYRINGE
INTRAMUSCULAR | Status: DC | PRN
  Administered 2019-02-08: 60 mg via INTRAVENOUS

## 2019-02-08 MED ORDER — HYDROCODONE-ACETAMINOPHEN 5-325 MG PO TABS
1.0000 | ORAL_TABLET | ORAL | Status: DC | PRN
  Filled 2019-02-08: qty 2

## 2019-02-08 MED ORDER — OXYCODONE HCL 5 MG PO TABS
5.0000 mg | ORAL_TABLET | Freq: Once | ORAL | Status: AC | PRN
  Administered 2019-02-08: 5 mg via ORAL
  Filled 2019-02-08: qty 1

## 2019-02-08 MED ORDER — DEXAMETHASONE SODIUM PHOSPHATE 10 MG/ML IJ SOLN
INTRAMUSCULAR | Status: AC
  Filled 2019-02-08: qty 1

## 2019-02-08 MED ORDER — METOCLOPRAMIDE HCL 5 MG PO TABS
5.0000 mg | ORAL_TABLET | Freq: Three times a day (TID) | ORAL | Status: DC | PRN
  Filled 2019-02-08: qty 2

## 2019-02-08 MED ORDER — DEXAMETHASONE SODIUM PHOSPHATE 4 MG/ML IJ SOLN
INTRAMUSCULAR | Status: DC | PRN
  Administered 2019-02-08: 5 mg via INTRAVENOUS

## 2019-02-08 MED ORDER — LACTATED RINGERS IV SOLN
INTRAVENOUS | Status: DC
  Filled 2019-02-08: qty 1000

## 2019-02-08 MED ORDER — MIDAZOLAM HCL 5 MG/5ML IJ SOLN
INTRAMUSCULAR | Status: DC | PRN
  Administered 2019-02-08: 2 mg via INTRAVENOUS

## 2019-02-08 MED ORDER — LIDOCAINE 2% (20 MG/ML) 5 ML SYRINGE
INTRAMUSCULAR | Status: AC
  Filled 2019-02-08: qty 5

## 2019-02-08 MED ORDER — ACETAMINOPHEN 325 MG PO TABS
325.0000 mg | ORAL_TABLET | Freq: Four times a day (QID) | ORAL | Status: DC | PRN
  Filled 2019-02-08: qty 2

## 2019-02-08 MED ORDER — FENTANYL CITRATE (PF) 100 MCG/2ML IJ SOLN
INTRAMUSCULAR | Status: AC
  Filled 2019-02-08: qty 2

## 2019-02-08 MED ORDER — KETOROLAC TROMETHAMINE 30 MG/ML IJ SOLN
INTRAMUSCULAR | Status: DC | PRN
  Administered 2019-02-08: 15 mg via INTRAVENOUS

## 2019-02-08 MED ORDER — KETOROLAC TROMETHAMINE 15 MG/ML IJ SOLN
15.0000 mg | Freq: Once | INTRAMUSCULAR | Status: DC
  Filled 2019-02-08: qty 1

## 2019-02-08 MED ORDER — ACETAMINOPHEN 10 MG/ML IV SOLN
INTRAVENOUS | Status: AC
  Filled 2019-02-08: qty 100

## 2019-02-08 MED ORDER — MIDAZOLAM HCL 2 MG/2ML IJ SOLN
INTRAMUSCULAR | Status: AC
  Filled 2019-02-08: qty 2

## 2019-02-08 MED ORDER — PROPOFOL 10 MG/ML IV BOLUS
INTRAVENOUS | Status: DC | PRN
  Administered 2019-02-08: 150 mg via INTRAVENOUS
  Administered 2019-02-08: 30 mg via INTRAVENOUS

## 2019-02-08 MED ORDER — BACITRACIN-NEOMYCIN-POLYMYXIN 400-5-5000 EX OINT
TOPICAL_OINTMENT | CUTANEOUS | Status: DC | PRN
  Administered 2019-02-08: 1 via TOPICAL

## 2019-02-08 MED ORDER — HYDROCODONE-ACETAMINOPHEN 5-325 MG PO TABS
1.0000 | ORAL_TABLET | Freq: Four times a day (QID) | ORAL | Status: DC
  Filled 2019-02-08: qty 1

## 2019-02-08 MED ORDER — CEFAZOLIN SODIUM-DEXTROSE 2-4 GM/100ML-% IV SOLN
2.0000 g | INTRAVENOUS | Status: AC
  Administered 2019-02-08: 2 g via INTRAVENOUS
  Filled 2019-02-08: qty 100

## 2019-02-08 MED ORDER — FENTANYL CITRATE (PF) 100 MCG/2ML IJ SOLN
INTRAMUSCULAR | Status: DC | PRN
  Administered 2019-02-08 (×4): 25 ug via INTRAVENOUS

## 2019-02-08 MED ORDER — ONDANSETRON HCL 4 MG/2ML IJ SOLN
INTRAMUSCULAR | Status: AC
  Filled 2019-02-08: qty 2

## 2019-02-08 MED ORDER — ONDANSETRON HCL 4 MG/2ML IJ SOLN
4.0000 mg | Freq: Four times a day (QID) | INTRAMUSCULAR | Status: DC | PRN
  Filled 2019-02-08: qty 2

## 2019-02-08 MED ORDER — FENTANYL CITRATE (PF) 100 MCG/2ML IJ SOLN
25.0000 ug | INTRAMUSCULAR | Status: DC | PRN
  Administered 2019-02-08: 12:00:00 25 ug via INTRAVENOUS
  Administered 2019-02-08: 50 ug via INTRAVENOUS
  Administered 2019-02-08: 25 ug via INTRAVENOUS
  Filled 2019-02-08: qty 1

## 2019-02-08 MED ORDER — ONDANSETRON HCL 4 MG/2ML IJ SOLN
INTRAMUSCULAR | Status: DC | PRN
  Administered 2019-02-08: 4 mg via INTRAVENOUS

## 2019-02-08 SURGICAL SUPPLY — 52 items
BANDAGE ELASTIC 3 VELCRO ST LF (GAUZE/BANDAGES/DRESSINGS) ×3 IMPLANT
BENZOIN TINCTURE PRP APPL 2/3 (GAUZE/BANDAGES/DRESSINGS) IMPLANT
BLADE SURG 15 STRL LF DISP TIS (BLADE) ×2 IMPLANT
BLADE SURG 15 STRL SS (BLADE) ×2
BNDG ELASTIC 3X5.8 VLCR STR LF (GAUZE/BANDAGES/DRESSINGS) ×2 IMPLANT
BNDG ESMARK 4X9 LF (GAUZE/BANDAGES/DRESSINGS) ×3 IMPLANT
BNDG GAUZE ELAST 4 BULKY (GAUZE/BANDAGES/DRESSINGS) ×3 IMPLANT
CLOSURE WOUND 1/2 X4 (GAUZE/BANDAGES/DRESSINGS)
CLOTH BEACON ORANGE TIMEOUT ST (SAFETY) ×3 IMPLANT
CORDS BIPOLAR (ELECTRODE) ×3 IMPLANT
COVER BACK TABLE 60X90IN (DRAPES) ×3 IMPLANT
COVER MAYO STAND STRL (DRAPES) ×3 IMPLANT
COVER WAND RF STERILE (DRAPES) ×4 IMPLANT
DRAPE EXTREMITY T 121X128X90 (DISPOSABLE) ×3 IMPLANT
DRAPE SHEET LG 3/4 BI-LAMINATE (DRAPES) ×3 IMPLANT
DRSG ADAPTIC 3X8 NADH LF (GAUZE/BANDAGES/DRESSINGS) ×1 IMPLANT
DRSG PAD ABDOMINAL 8X10 ST (GAUZE/BANDAGES/DRESSINGS) ×6 IMPLANT
DURAPREP 26ML APPLICATOR (WOUND CARE) ×3 IMPLANT
ELECT REM PT RETURN 9FT ADLT (ELECTROSURGICAL) ×3
ELECTRODE REM PT RTRN 9FT ADLT (ELECTROSURGICAL) IMPLANT
GAUZE SPONGE 4X4 12PLY STRL (GAUZE/BANDAGES/DRESSINGS) ×3 IMPLANT
GAUZE SPONGE 4X4 12PLY STRL LF (GAUZE/BANDAGES/DRESSINGS) ×2 IMPLANT
GAUZE XEROFORM 1X8 LF (GAUZE/BANDAGES/DRESSINGS) ×4 IMPLANT
GLOVE BIO SURGEON STRL SZ8.5 (GLOVE) ×3 IMPLANT
GLOVE INDICATOR 8.5 STRL (GLOVE) ×3 IMPLANT
GLOVE SURG ORTHO 6.0 STRL STRW (GLOVE) ×1 IMPLANT
GOWN W/2 COTTON TOWELS 2 STD (GOWNS) ×3 IMPLANT
KIT TURNOVER CYSTO (KITS) ×3 IMPLANT
MANIFOLD NEPTUNE II (INSTRUMENTS) IMPLANT
NEEDLE HYPO 22GX1.5 SAFETY (NEEDLE) IMPLANT
NS IRRIG 500ML POUR BTL (IV SOLUTION) ×3 IMPLANT
PACK BASIN DAY SURGERY FS (CUSTOM PROCEDURE TRAY) ×3 IMPLANT
PAD ABD 8X10 STRL (GAUZE/BANDAGES/DRESSINGS) ×2 IMPLANT
PADDING CAST ABS 4INX4YD NS (CAST SUPPLIES)
PADDING CAST ABS COTTON 4X4 ST (CAST SUPPLIES) ×1 IMPLANT
PENCIL BUTTON HOLSTER BLD 10FT (ELECTRODE) IMPLANT
SLING ARM FOAM STRAP MED (SOFTGOODS) ×2 IMPLANT
STOCKINETTE 4X48 STRL (DRAPES) ×3 IMPLANT
STRIP CLOSURE SKIN 1/2X4 (GAUZE/BANDAGES/DRESSINGS) IMPLANT
SUT ETHILON 3 0 PS 1 (SUTURE) ×4 IMPLANT
SUT VIC AB 1 CT1 36 (SUTURE) IMPLANT
SUT VIC AB 3-0 FS2 27 (SUTURE) IMPLANT
SUT VIC AB 3-0 PS1 18 (SUTURE)
SUT VIC AB 3-0 PS1 18XBRD (SUTURE) IMPLANT
SUT VICRYL 4-0 PS2 18IN ABS (SUTURE) IMPLANT
SYR BULB IRRIGATION 50ML (SYRINGE) ×3 IMPLANT
SYR CONTROL 10ML LL (SYRINGE) ×2 IMPLANT
TOWEL OR 17X26 10 PK STRL BLUE (TOWEL DISPOSABLE) ×4 IMPLANT
TUBE CONNECTING 12'X1/4 (SUCTIONS)
TUBE CONNECTING 12X1/4 (SUCTIONS) IMPLANT
UNDERPAD 30X30 (UNDERPADS AND DIAPERS) ×3 IMPLANT
WATER STERILE IRR 500ML POUR (IV SOLUTION) ×3 IMPLANT

## 2019-02-08 NOTE — Op Note (Signed)
NAME: Zingg, Dunbar [id number] ACCOUNT [id number] DATE OF BIRTH:03/16/1960 FACILITY: [facility] LOCATION: [facility] PHYSICIAN:Carthel Castille Fransico Setters, MD  OPERATIVE REPORT  DATE OF PROCEDURE:  02/08/2019  SURGEON:  Latanya Maudlin, MD  ASSISTANT:  Nurse  PREOPERATIVE DIAGNOSIS:  Severe carpal tunnel syndrome on the right proven with the nerve conduction studies.    OPERATION:  Right carpal tunnel release.  DESCRIPTION OF PROCEDURE:  Under general anesthesia, routine orthopedic prep and draping right upper extremity was carried out.  The appropriate time-out was carried out first.  Also marked the appropriate right arm in a holding area.  At this time, the  right upper extremity was exsanguinated with Esmarch, tourniquet was elevated at 225 mmHg.  Total tourniquet time was about 14 minutes.  At this time, a small curvilinear incision was made over the palmar aspect of the right hand.  Following that, I  identified the palmaris longus tendon and gently retracted that.  I then identified the superficial and deep transverse carpal ligament.  I made a small incision in the ligament.  At that time, exposed the median nerve.  I then protected the nerve with a  Valora Corporal.  I then split the deep and superficial transverse carpal ligament down into the palm and proximally.  The nerve now was totally free.  There were no other abnormalities in the carpal tunnel.  I irrigated the area out and closed the skin only with  3-0 nylon suture.  Sterile Neosporin bundle dressing was applied.  The patient left the operating room in satisfactory condition.  Once she is stable in recovery, she will be released.  She will be on the appropriate pain medicine, 1 every 4 hours  p.r.n. for pain.  She will see me in the office Friday for dressing change.  CN/NUANCE  D:02/08/2019 T:02/08/2019 JOB:[id number]

## 2019-02-08 NOTE — Discharge Instructions (Addendum)
Do not remove dressing. We will change dressing at your first post operative appointment on 02/10/19. Keep bandages dry. No driving. Call if any temperatures greater than 101 or any wound complications: [phone] during the day and ask for Dr. Charlestine Night medical assistant, Brunilda Payor.     Post Anesthesia Home Care Instructions  Activity: Get plenty of rest for the remainder of the day. A responsible adult should stay with you for 24 hours following the procedure.  For the next 24 hours, DO NOT: -Drive a car -Paediatric nurse -Drink alcoholic beverages -Take any medication unless instructed by your physician -Make any legal decisions or sign important papers.  Meals: Start with liquid foods such as gelatin or soup. Progress to regular foods as tolerated. Avoid greasy, spicy, heavy foods. If nausea and/or vomiting occur, drink only clear liquids until the nausea and/or vomiting subsides. Call your physician if vomiting continues.  Special Instructions/Symptoms: Your throat may feel dry or sore from the anesthesia or the breathing tube placed in your throat during surgery. If this causes discomfort, gargle with warm salt water. The discomfort should disappear within 24 hours.  If you had a scopolamine patch placed behind your ear for the management of post- operative nausea and/or vomiting:  1. The medication in the patch is effective for 72 hours, after which it should be removed.  Wrap patch in a tissue and discard in the trash. Wash hands thoroughly with soap and water. 2. You may remove the patch earlier than 72 hours if you experience unpleasant side effects which may include dry mouth, dizziness or visual disturbances. 3. Avoid touching the patch. Wash your hands with soap and water after contact with the patch.    NO IBUPROFEN PRODUCTS (MOTRIN, ADVIL) OR ALEVE UNTIL 5:30 PM TODAY.   NO TYLENOL UNTIL 7:00PM TODAY.

## 2019-02-08 NOTE — H&P (Signed)
Toni Gutierrez is an 58 y.o. female.   Chief Complaint: Pain and numbness in her Rhand. HPI: She has showed progressive pain and numbness in her Right Hand. Nerve studies show severe Right Carpal Tunnel Syndrome.  Past Medical History:  Diagnosis Date  . Alcohol abuse    No alcohol intake since 2001  . Allergic rhinitis   . Anemia   . Arthritis    "back, hands" (01/06/2018)  . Carpal tunnel syndrome, right   . Chronic lower back pain    MRI of cervical, lumbar spine (all obtained by Dr. Ronnie Derby in 2006)- showing mild DDD with bulges but without disc herniation, at multiple levels; mild facet joint dz, primarily at L3-4 and L4-5 bilaterally  . Chronic lower back pain   . Heartburn   . Hyperlipidemia   . Hypertension   . Left knee pain    s/p ACL reconstruction by Dr. Ronnie Derby  . Shoulder pain, bilateral    MRI right shoulder showing tear of infraspinatus tendon with bursal surface fraying of the infraspinatus    Past Surgical History:  Procedure Laterality Date  . ANTERIOR CRUCIATE LIGAMENT REPAIR Left    By Dr. Ronnie Derby  . CESAREAN SECTION  1986  . SHOULDER ARTHROSCOPY W/ ROTATOR CUFF REPAIR Right   . TUBAL LIGATION  1989    Family History  Problem Relation Age of Onset  . Stroke Mother   . Colon cancer Neg Hx   . Esophageal cancer Neg Hx   . Rectal cancer Neg Hx   . Stomach cancer Neg Hx    Social History:  reports that she has never smoked. She has never used smokeless tobacco. She reports previous alcohol use of about 24.0 standard drinks of alcohol per week. She reports previous drug use.  Allergies: No Known Allergies  Medications Prior to Admission  Medication Sig Dispense Refill  . docusate sodium (COLACE) 100 MG capsule Take 100 mg by mouth 2 (two) times daily.    . ferrous sulfate 325 (65 FE) MG tablet Take 325 mg by mouth 2 (two) times daily with a meal.    . gabapentin (NEURONTIN) 300 MG capsule Take 300 mg by mouth at bedtime.  0  .  HYDROcodone-acetaminophen (NORCO/VICODIN) 5-325 MG tablet Take 1 tablet by mouth 4 (four) times daily.  0  . lisinopril (PRINIVIL,ZESTRIL) 20 MG tablet Take 1 tablet (20 mg total) by mouth daily. 30 tablet 3  . omeprazole (PRILOSEC) 20 MG capsule Take 2 capsules (40 mg total) by mouth daily. (Patient taking differently: Take 20 mg by mouth daily. ) 60 capsule 0    Results for orders placed or performed during the hospital encounter of 02/08/19 (from the past 48 hour(s))  CBC WITH DIFFERENTIAL     Status: None   Collection Time: 02/08/19  9:20 AM  Result Value Ref Range   WBC 5.4 4.0 - 10.5 K/uL   RBC 4.37 3.87 - 5.11 MIL/uL   Hemoglobin 13.1 12.0 - 15.0 g/dL   HCT 39.7 36.0 - 46.0 %   MCV 90.8 80.0 - 100.0 fL   MCH 30.0 26.0 - 34.0 pg   MCHC 33.0 30.0 - 36.0 g/dL   RDW 12.7 11.5 - 15.5 %   Platelets 288 150 - 400 K/uL   nRBC 0.0 0.0 - 0.2 %   Neutrophils Relative % 49 %   Neutro Abs 2.7 1.7 - 7.7 K/uL   Lymphocytes Relative 38 %   Lymphs Abs 2.0 0.7 - 4.0 K/uL  Monocytes Relative 5 %   Monocytes Absolute 0.3 0.1 - 1.0 K/uL   Eosinophils Relative 7 %   Eosinophils Absolute 0.4 0.0 - 0.5 K/uL   Basophils Relative 1 %   Basophils Absolute 0.1 0.0 - 0.1 K/uL   Immature Granulocytes 0 %   Abs Immature Granulocytes 0.01 0.00 - 0.07 K/uL    Comment: Performed at [facility], [place] [address], [place], [place] [address]  Comprehensive metabolic panel     Status: Abnormal   Collection Time: 02/08/19  9:20 AM  Result Value Ref Range   Sodium 143 135 - 145 mmol/L   Potassium 3.4 (L) 3.5 - 5.1 mmol/L   Chloride 110 98 - 111 mmol/L   CO2 24 22 - 32 mmol/L   Glucose, Bld 111 (H) 70 - 99 mg/dL   BUN 11 6 - 20 mg/dL   Creatinine, Ser 0.65 0.44 - 1.00 mg/dL   Calcium 9.0 8.9 - 10.3 mg/dL   Total Protein 7.2 6.5 - 8.1 g/dL   Albumin 3.8 3.5 - 5.0 g/dL   AST 18 15 - 41 U/L   ALT 18 0 - 44 U/L   Alkaline Phosphatase 81 38 - 126 U/L   Total Bilirubin 0.3 0.3 - 1.2 mg/dL    GFR calc non Af Amer >60 >60 mL/min   GFR calc Af Amer >60 >60 mL/min   Anion gap 9 5 - 15    Comment: Performed at [facility], [place] [address], [place], [place] [address]  Protime-INR     Status: None   Collection Time: 02/08/19  9:20 AM  Result Value Ref Range   Prothrombin Time 12.6 11.4 - 15.2 seconds   INR 1.0 0.8 - 1.2    Comment: (NOTE) INR goal varies based on device and disease states. Performed at [facility], [place] [address], [place], [place] [address]    No results found.  Review of Systems  Constitutional: Negative.   HENT: Negative.   Eyes: Negative.   Respiratory: Negative.   Cardiovascular: Negative.   Gastrointestinal: Negative.   Endocrine: Negative.   Genitourinary: Negative.   Musculoskeletal: Positive for joint swelling.  Allergic/Immunologic: Negative.   Neurological: Positive for weakness.  Hematological: Negative.   Psychiatric/Behavioral: Negative.     Blood pressure (!) 199/108, pulse 74, temperature 97.9 F (36.6 C), temperature source Oral, resp. rate 16, height 4\' 11"  (1.499 m), weight 58.2 kg, last menstrual period 12/25/2010, SpO2 100 %. Physical Exam  Constitutional: She appears well-developed.  HENT:  Head: Normocephalic.  Eyes: Pupils are equal, round, and reactive to light.  Cardiovascular: Normal rate.  Respiratory: Effort normal.  GI: Soft.  Musculoskeletal:        General: Tenderness present.     Cervical back: Normal range of motion.  Neurological:  Decrease in sensation in Thumb,Index and middle fingers,palmar aspect Right Hand.     Assessment/Plan Right Carpal Release.  Latanya Maudlin, MD 02/08/2019, 10:45 AM

## 2019-02-08 NOTE — Interval H&P Note (Signed)
History and Physical Interval Note:  02/08/2019 10:51 AM  Toni Gutierrez  has presented today for surgery, with the diagnosis of Right hand carpal tunnel syndrome.  The various methods of treatment have been discussed with the patient and family. After consideration of risks, benefits and other options for treatment, the patient has consented to  Procedure(s): CARPAL TUNNEL RELEASE (Right) as a surgical intervention.  The patient's history has been reviewed, patient examined, no change in status, stable for surgery.  I have reviewed the patient's chart and labs.  Questions were answered to the patient's satisfaction.     Latanya Maudlin

## 2019-02-08 NOTE — Anesthesia Procedure Notes (Signed)
Procedure Name: LMA Insertion Date/Time: 02/08/2019 11:09 AM Performed by: Justice Rocher, CRNA Pre-anesthesia Checklist: Patient identified, Emergency Drugs available, Suction available and Patient being monitored Patient Re-evaluated:Patient Re-evaluated prior to induction Oxygen Delivery Method: Circle system utilized Preoxygenation: Pre-oxygenation with 100% oxygen Induction Type: IV induction Ventilation: Mask ventilation without difficulty LMA: LMA inserted LMA Size: 3.0 Number of attempts: 1 Airway Equipment and Method: Bite block Placement Confirmation: positive ETCO2 and breath sounds checked- equal and bilateral Tube secured with: Tape Dental Injury: Teeth and Oropharynx as per pre-operative assessment

## 2019-02-08 NOTE — Anesthesia Postprocedure Evaluation (Signed)
Anesthesia Post Note  Patient: Toni Gutierrez  Procedure(s) Performed: CARPAL TUNNEL RELEASE (Right Arm Lower)     Patient location during evaluation: PACU Anesthesia Type: General Level of consciousness: awake and alert Pain management: pain level controlled Vital Signs Assessment: post-procedure vital signs reviewed and stable Respiratory status: spontaneous breathing, nonlabored ventilation, respiratory function stable and patient connected to nasal cannula oxygen Cardiovascular status: blood pressure returned to baseline and stable Postop Assessment: no apparent nausea or vomiting Anesthetic complications: no    Last Vitals:  Vitals:   02/08/19 1315 02/08/19 1415  BP: (!) 169/85 (!) 154/83  Pulse:  (!) 56  Resp: 16 16  Temp:  36.5 C  SpO2:  98%    Last Pain:  Vitals:   02/08/19 1230  TempSrc:   PainSc: 10-Worst pain ever                 Ayomide Zuleta S

## 2019-02-08 NOTE — Transfer of Care (Signed)
Immediate Anesthesia Transfer of Care Note  Patient: Toni Gutierrez  Procedure(s) Performed: Procedure(s) (LRB): CARPAL TUNNEL RELEASE (Right)  Patient Location: PACU  Anesthesia Type: General  Level of Consciousness: awake, sedated, patient cooperative and responds to stimulation  Airway & Oxygen Therapy: Patient Spontanous Breathing and Patient connected to [place] 02 and soft FM  Post-op Assessment: Report given to PACU RN, Post -op Vital signs reviewed and stable and Patient moving all extremities  Post vital signs: Reviewed and stable  Complications: No apparent anesthesia complications

## 2019-02-08 NOTE — Anesthesia Preprocedure Evaluation (Signed)
Anesthesia Evaluation  Patient identified by MRN, date of birth, ID band Patient awake    Reviewed: Allergy & Precautions, H&P , NPO status , Patient's Chart, lab work & pertinent test results  Airway Mallampati: II   Neck ROM: full    Dental   Pulmonary neg pulmonary ROS,    breath sounds clear to auscultation       Cardiovascular hypertension,  Rhythm:regular Rate:Normal     Neuro/Psych  Neuromuscular disease    GI/Hepatic GERD  ,  Endo/Other    Renal/GU      Musculoskeletal  (+) Arthritis ,   Abdominal   Peds  Hematology   Anesthesia Other Findings   Reproductive/Obstetrics                             Anesthesia Physical Anesthesia Plan  ASA: II  Anesthesia Plan: General   Post-op Pain Management:    Induction: Intravenous  PONV Risk Score and Plan: 3 and Ondansetron, Dexamethasone, Midazolam, Treatment may vary due to age or medical condition and Scopolamine patch - Pre-op  Airway Management Planned: LMA  Additional Equipment:   Intra-op Plan:   Post-operative Plan: Extubation in OR  Informed Consent: I have reviewed the patients History and Physical, chart, labs and discussed the procedure including the risks, benefits and alternatives for the proposed anesthesia with the patient or authorized representative who has indicated his/her understanding and acceptance.       Plan Discussed with: CRNA, Anesthesiologist and Surgeon  Anesthesia Plan Comments:         Anesthesia Quick Evaluation

## 2019-02-08 NOTE — Brief Op Note (Signed)
02/08/2019  11:50 AM  PATIENT:  Toni Gutierrez  58 y.o. female  PRE-OPERATIVE DIAGNOSIS:  Right hand carpal tunnel syndrome  POST-OPERATIVE DIAGNOSIS:  Right hand carpal tunnel syndrome  PROCEDURE:  Procedure(s): CARPAL TUNNEL RELEASE (Right)  SURGEON:  Surgeon(s) and Role:    Latanya Maudlin, MD - Primary  PHYSICIAN ASSISTANT: Nurse  ASSISTANTS: {Nurse  ANESTHESIA:   general  EBL:  2 mL   BLOOD ADMINISTERED:none  DRAINS: none   LOCAL MEDICATIONS USED:  NONE  SPECIMEN:  No Specimen  DISPOSITION OF SPECIMEN:  N/A  COUNTS:  YES  TOURNIQUET:   Total Tourniquet Time Documented: Upper Arm (Right) - 14 minutes Total: Upper Arm (Right) - 14 minutes   DICTATION: .Other Dictation: Dictation Number [id number]  PLAN OF CARE: Discharge to home after PACU  PATIENT DISPOSITION:  PACU - hemodynamically stable.   Delay start of Pharmacological VTE agent (>24hrs) due to surgical blood loss or risk of bleeding: yes

## 2019-03-28 ENCOUNTER — Other Ambulatory Visit: Payer: Self-pay | Admitting: Family

## 2019-03-28 ENCOUNTER — Other Ambulatory Visit (HOSPITAL_COMMUNITY): Payer: Self-pay | Admitting: Family

## 2019-03-28 DIAGNOSIS — M545 Low back pain, unspecified: Secondary | ICD-10-CM

## 2019-03-28 DIAGNOSIS — G8929 Other chronic pain: Secondary | ICD-10-CM

## 2019-04-21 ENCOUNTER — Other Ambulatory Visit: Payer: Self-pay

## 2019-04-21 ENCOUNTER — Ambulatory Visit (HOSPITAL_COMMUNITY)
Admission: RE | Admit: 2019-04-21 | Discharge: 2019-04-21 | Disposition: A | Discharge status: Home / Self Care | Payer: Medicare Other | Source: Ambulatory Visit | Attending: Family | Admitting: Family

## 2019-04-21 ENCOUNTER — Ambulatory Visit (HOSPITAL_COMMUNITY): Admission: RE | Admit: 2019-04-21 | Payer: Medicare Other | Source: Ambulatory Visit

## 2019-04-21 DIAGNOSIS — G8929 Other chronic pain: Secondary | ICD-10-CM | POA: Diagnosis present

## 2019-04-21 DIAGNOSIS — M545 Low back pain: Secondary | ICD-10-CM | POA: Insufficient documentation

## 2019-04-21 DIAGNOSIS — M549 Dorsalgia, unspecified: Secondary | ICD-10-CM | POA: Insufficient documentation

## 2019-05-18 ENCOUNTER — Other Ambulatory Visit: Payer: Self-pay | Admitting: Family

## 2019-05-18 DIAGNOSIS — Z1231 Encounter for screening mammogram for malignant neoplasm of breast: Secondary | ICD-10-CM

## 2019-05-19 ENCOUNTER — Ambulatory Visit: Payer: Self-pay | Admitting: General Surgery

## 2019-05-19 NOTE — H&P (Signed)
Toni Gutierrez Documented: 05/18/2019 1:56 PM Location: [facility] Patient #: [id number] DOB: May 23, 1960 Married / Language: English / Race: Black or African American Female  History of Present Illness Toni Hiss M. [name] MD; 05/19/2019 3:48 PM) The patient is a 59 year old female who presents with non-malignant abdominal pain. She comes in today for additional conversation regarding her known large hiatal hernia and abdominal complaints. Initially met her back in 2019. I think this is about the fourth visit to discuss her abdominal concerns. This will be the second visit to again rediscuss hiatal hernia repair and possible cholecystectomy. She did not proceed with surgery last summer. Its unclear why. She is accompanied by family member today. She is ready to proceed with surgery she states. She states that she is about the same. She has more left-sided and left upper quadrant pain and right-sided abdominal pain. She is still not drinking. She is still on omeprazole for heartburn. She has not vomited. She is able to tolerate liquids and solids. Still only eats small portions. Still have occasional sensations of a twisting or knot sensation in her upper abdomen after eating. No tobacco use. No chest tightness, angina, jaw pain, TIAs or amaurosis fugax. No blurry vision. No severe headaches. No dysuria or hematuria. No melena or hematochezia. Denies heartburn at night. She'll occasionally have some right-sided discomfort at times.  I reviewed my last office note from June of last year. I also rereviewed the CT scan from last June which showed a very large hiatal hernia with 80-90% of the stomach in the chest, cholelithiasis, no evidence of varices or portal hypertension. I also reviewed her Epic chart to see if there are any significant medical encounters  June 2020 She comes in today for long-term follow-up regarding her large hiatal hernia and abdominal complaints. I  initially met her last July and most recently we connected via telephone in March during the coronavirus pandemic. She states that she's been doing about same. She still has bilateral lateral abdominal pain. She also states that she still has occasional sensations of a twisting or knot sensation in her upper abdomen that can occur after eating. She has it about 4-5 times per month. She has some nausea but no emesis. She doesn't really complain of heartburn or reflux but is on medication for it. She had previously complained of getting full on small portions. She states that she is still not drinking any alcohol. No tobacco use. No diarrhea or constipation. Since her last visit we obtained a CT scan which demonstrated about 80-90% of the stomach in the thorax. Cholelithiasis. There is no evidence of cirrhosis or portal hypertension or varices.  May 19 2018 I had referred her last October to GI because at that time the majority of her pain was left-sided and was reporting early satiety. She underwent upper endoscopy on December 13 by Dr. Ardis Hughs. She was found to have a large hiatal hernia. It was biopsy positive for H. pylori. She completed 2 weeks of treatment. She underwent an upper GI at the end of January which showed a large hiatal hernia. There is some paraesophageal component. She was sent back to discuss hiatal hernia repair and possible cholecystectomy. She states that she is about the same. She is still having periods of right-sided discomfort mainly in the evening. It lasts 5-10 minutes. There is not really any associated nausea and vomiting. Some coughing and movement does exacerbate the discomfort at times. She also reports some sensation of  early satiety. She denies any sensation of food or liquid getting stuck. She also complains of bloating as well as flatus. She has daily bowel movements. She denies any melena or hematochezia or hematemesis. She was briefly  hospitalized in November 2019 for altered mental status thought to be due to chronic alcohol use. She states that she has not had any alcohol in 4 months. Otherwise she denies any medical changes.  12/22/2017 She comes back in today to discuss ongoing abdominal issues. I initially saw her in July. She is referred for gallstones. On outside imaging which I did not have the images of only a copy it mentioned that she had gallstones, some hepatic steatosis and a large hiatal hernia. She states that she is about same. She points to her left side. She reports postprandial discomfort. She states that she will get full on a small amount of food. Liquids are generally more tolerated than solid foods. She denies any regurgitation or vomiting. She may have some intermittent right-sided pain but the majority of her discomfort is on the left side. She reports a diminished appetite. The discomfort after eating happens daily. It will generally last 10-15 minutes but then off and on for 30 minutes. She takes reflux medication for heartburn. She is still on chronic narcotics for back pain. She is monitored by her primary care for compliance. She has not lost any weight since I first met her.   09/15/2017 She is referred by Hubbard Robinson PA for evaluation of abdominal pain and gallstones. She states that she has episodes of abdominal discomfort. She points to bilateral lower abdominal quadrants. She also points to her right side. She has some occasional nausea. She also gets full on a small amount of food. She is unsure if there is a particular pattern to it. She has noticed occasionally she may have some worsening discomfort on the right side after eating food but can't say for sure. She denies any vomiting. She denies any fevers or chills. She denies any melena or hematochezia. She does take chronic narcotics daily for chronic back pain. She had a CT scan at an outside facility which I don't have  access to. On the reported said that the patient had a large hiatal hernia, gallstones, and the hepatic steatosis. She denies tobacco. She does drink a margarita occasionally. She says that sometimes the discomfort is worse in the morning on the right side. She denies any acholic stools.   Problem List/Past Medical Toni Hiss M. Redmond Pulling, MD; 05/19/2019 3:52 PM) CONSTIPATION IN FEMALE (K59.00) HISTORY OF ALCOHOL USE (Z87.898) HIATAL HERNIA (K44.9) see discussion below  This patient encounter took 32 minutes today to perform the following: take history, perform exam, review outside records, interpret imaging, counsel the patient on their diagnosis & discuss anatomy, surgical plan, risk/benefits and outcomes/expectations GALLSTONES (K80.20)  Past Surgical History Toni Hiss M. Redmond Pulling, MD; 05/19/2019 3:52 PM) Knee Surgery Left. Shoulder Surgery Right.  Diagnostic Studies History Toni Hiss M. Redmond Pulling, MD; 05/19/2019 3:52 PM) Colonoscopy within last year Mammogram 1-3 years ago Pap Smear 1-5 years ago  Allergies (Chanel Teressa Senter, CMA; 05/18/2019 1:56 PM) No Known Allergies [05/18/2019]: No Known Drug Allergies [09/15/2017]: Allergies Reconciled  Medication History Toni Hiss M. Redmond Pulling, MD; 05/19/2019 3:52 PM) Caroleen Hamman (325 (65 Fe)MG Tablet, Oral) Active. Hydrocodone-Acetaminophen (5-325MG Tablet, Oral) Active. Pregabalin (50MG Capsule, Oral) Active. Atorvastatin Calcium (40MG Tablet, Oral) Active. Meloxicam (15MG Tablet, Oral) Active. tiZANidine HCl (2MG Tablet, Oral) Active. Omeprazole (20MG Capsule DR, Oral) Active. Medications Reconciled Dulcolax (5MG Tablet  DR, Oral) Active. Gabapentin (300MG Capsule, Oral) Active. Lisinopril (20MG Tablet, Oral) Active. Naproxen (375MG Tablet, Oral) Active. Cetirizine HCl (10MG Tablet, Oral) Active.  Social History Toni Hiss M. Redmond Pulling, MD; 05/19/2019 3:52 PM) Alcohol use Remotely quit alcohol use. Caffeine use Coffee. No drug use Tobacco use  Never smoker.  Family History Toni Hiss M. Redmond Pulling, MD; 05/19/2019 3:52 PM) Hypertension Sister. Migraine Headache Daughter.  Pregnancy / Birth History Toni Hiss M. Redmond Pulling, MD; 05/19/2019 3:52 PM) Age at menarche 21 years. Age of menopause 35-55 Gravida 4 Irregular periods Maternal age 47-25 Para 22  Other Problems Leighton Ruff. Redmond Pulling, MD; 05/19/2019 3:52 PM) Arthritis Back Pain Cholelithiasis Gastroesophageal Reflux Disease ESSENTIAL HYPERTENSION (I10)     Review of Systems Toni Hiss M. Hester Forget MD; 05/19/2019 3:43 PM) All other systems negative  Vitals (Chanel Nolan CMA; 05/18/2019 1:57 PM) 05/18/2019 1:57 PM Weight: 128.38 lb Height: 58in Body Surface Area: 1.51 m Body Mass Index: 26.83 kg/m  Temp.: 97.12F  Pulse: 77 (Regular)  BP: 136/74 (Sitting, Left Arm, Standard)        Physical Exam Toni Hiss M. Avrey Hyser MD; 05/19/2019 3:42 PM)  General Mental Status-Alert. General Appearance-Consistent with stated age. Hydration-Well hydrated. Voice-Normal.  Head and Neck Head-normocephalic, atraumatic with no lesions or palpable masses. Trachea-midline. Thyroid Gland Characteristics - normal size and consistency.  Eye Eyeball - Bilateral-Extraocular movements intact. Sclera/Conjunctiva - Bilateral-No scleral icterus.  ENMT Ears -Note:normal external ears.  Mouth and Throat -Note:lips intact.   Chest and Lung Exam Chest and lung exam reveals -quiet, even and easy respiratory effort with no use of accessory muscles and on auscultation, normal breath sounds, no adventitious sounds and normal vocal resonance. Inspection Chest Wall - Normal. Back - normal.  Breast - Did not examine.  Cardiovascular Cardiovascular examination reveals -normal heart sounds, regular rate and rhythm with no murmurs and normal pedal pulses bilaterally.  Abdomen Inspection Inspection of the abdomen reveals - No Hernias. Skin - Scar - no surgical  scars. Palpation/Percussion Palpation and Percussion of the abdomen reveal - Soft, Non Tender, No Rebound tenderness, No Rigidity (guarding) and No hepatosplenomegaly. Auscultation Auscultation of the abdomen reveals - Bowel sounds normal.  Peripheral Vascular Upper Extremity Palpation - Pulses bilaterally normal.  Neurologic Neurologic evaluation reveals -alert and oriented x 3 with no impairment of recent or remote memory. Mental Status-Normal.  Neuropsychiatric The patient's mood and affect are described as -normal. Judgment and Insight-insight is appropriate concerning matters relevant to self.  Musculoskeletal Normal Exam - Left-Upper Extremity Strength Normal and Lower Extremity Strength Normal. Normal Exam - Right-Upper Extremity Strength Normal and Lower Extremity Strength Normal.  Lymphatic Head & Neck  General Head & Neck Lymphatics: Bilateral - Description - Normal. Axillary - Did not examine. Femoral & Inguinal - Did not examine.    Assessment & Plan Toni Hiss M. Sharlynn Seckinger MD; 05/19/2019 3:52 PM)  HIATAL HERNIA (K44.9) Story: see discussion below  This patient encounter took 32 minutes today to perform the following: take history, perform exam, review outside records, interpret imaging, counsel the patient on their diagnosis & discuss anatomy, surgical plan, risk/benefits and outcomes/expectations Impression: She has a fairly large hiatal hernia on upper endoscopy and upper GI & CT. She appears to have some symptoms related to her hiatal hernia. I have recommended proceeding with laparoscopic repair of hiatal hernia with partial fundoplication and possible cholecystectomy. This is 3rd time we have discussed surgery for this hiatal hernia. I think some of her right-sided pain may be due to gallbladder issues but I think some  of her pain issues are musculoskeletal in nature.  Using an educational booklet and drawing diagrams, I reexplained to her hiatal hernia. We  rediscussed each portion of the procedure. We rediscussed the typical hospitalization. We rediscussed the typical recovery. We rediscussed the diet modification for the first 4-6 weeks after surgery. We rediscussed starting on liquids and then progressing to a pured foods and then progressing to a soft diet. We discussed the risk of surgery including but not limited to bleeding, infection, injury to surrounding structures such as the esophagus, intestine, spleen, stomach, lung; cardiac and pulmonary events, blood clot formation, wound infection, inability to vomit, failure to improve her reflux, trouble eating, gas bloating, slipped wrap, hernia recurrence, death. We discussed operating during the coronavirus pandemic. Explained the steps at the health system is taking. Discussed the importance of social distancing, wearing a face mask, washing hands etc. from the time she is screened until several weeks after surgery. Discussed how a leak would be potentially managed. she wants to proceed now.  plan lap repair of large hiatal hernia, gastropexy, poss fundoplication, poss egd, poss cholecystectomy  Current Plans Pt Education - Pamphlet Given - Gastroesophagel Reflux Disease: discussed with patient and provided information.  GALLSTONES (K80.20) Impression: I think some components of her history and symptoms are consistent with gallstones and may benefit from cholecystectomy however we did discuss that cholecystectomy may not ameliorate all of her abdominal complaints. To me it appears that the large hiatal hernia is the more pressing issue and what she is more symptomatic from. I told her that we could proceed with laparoscopic repair of her hiatal Hernia with partial fundoplication. If that portion of the procedure goes well and is not technically difficult then we may entertain same-day cholecystectomy. However if the hiatal hernia repair is challenging then I would not proceed with same-day  cholecystectomy.    We discussed the risks and benefits of a laparoscopic cholecystectomy including, but not limited to bleeding, infection, injury to surrounding structures such as the intestine or liver, bile leak, retained gallstones, need to convert to an open procedure, prolonged diarrhea, blood clots such as DVT, common bile duct injury, anesthesia risks, and possible need for additional procedures. We discussed the typical post-operative recovery course. I explained that the likelihood of improvement of their symptoms is fair- good.  Current Plans You are being scheduled for surgery- Our schedulers will call you.  You should hear from our office's scheduling department within 5 working days about the location, date, and time of surgery. We try to make accommodations for patient's preferences in scheduling surgery, but sometimes the OR schedule or the surgeon's schedule prevents [place] from making those accommodations.  If you have not heard from our office [phone]) in 5 working days, call the office and ask for your surgeon's nurse.  If you have other questions about your diagnosis, plan, or surgery, call the office and ask for your surgeon's nurse.  Leighton Ruff. Redmond Pulling, MD, FACS General, Bariatric, & Minimally Invasive Surgery [facility], [place]

## 2019-05-25 DIAGNOSIS — Z8616 Personal history of COVID-19: Secondary | ICD-10-CM

## 2019-05-25 HISTORY — DX: Personal history of COVID-19: Z86.16

## 2019-06-07 ENCOUNTER — Other Ambulatory Visit: Payer: Self-pay

## 2019-06-07 ENCOUNTER — Ambulatory Visit
Admission: RE | Admit: 2019-06-07 | Discharge: 2019-06-07 | Disposition: A | Discharge status: Home / Self Care | Payer: Medicare Other | Source: Ambulatory Visit | Attending: Family | Admitting: Family

## 2019-06-07 ENCOUNTER — Ambulatory Visit: Payer: Medicare Other

## 2019-06-07 DIAGNOSIS — Z1231 Encounter for screening mammogram for malignant neoplasm of breast: Secondary | ICD-10-CM

## 2019-06-09 ENCOUNTER — Other Ambulatory Visit: Payer: Self-pay | Admitting: Family

## 2019-06-09 DIAGNOSIS — R928 Other abnormal and inconclusive findings on diagnostic imaging of breast: Secondary | ICD-10-CM

## 2019-06-12 NOTE — Patient Instructions (Addendum)
DUE TO COVID-19 ONLY ONE VISITOR IS ALLOWED TO COME WITH YOU AND STAY IN THE WAITING ROOM ONLY DURING PRE OP AND PROCEDURE DAY OF SURGERY. THE 1 VISITOR MAY VISIT WITH YOU AFTER SURGERY IN YOUR PRIVATE ROOM DURING VISITING HOURS ONLY!  YOU NEED TO HAVE A COVID 19 TEST ON 06-23-19 @ 2:55 PM, THIS TEST MUST BE DONE BEFORE SURGERY, COME  [place], [place] , [address].  ([place]) ONCE YOUR COVID TEST IS COMPLETED, PLEASE BEGIN THE QUARANTINE INSTRUCTIONS AS OUTLINED IN YOUR HANDOUT.                Arlenne Serenitee Hillegas  06/12/2019   Your procedure is scheduled on: 06-27-19   Report to [facility] Main  Entrance    Report to Admitting at 9:00 AM     Call this number if you have problems the morning of surgery [phone]    Remember: NO SOLID FOOD AFTER MIDNIGHT THE NIGHT PRIOR TO SURGERY. NOTHING BY MOUTH EXCEPT CLEAR LIQUIDS UNTIL 8:00 AM . PLEASE FINISH ENSURE DRINK PER SURGEON ORDER  WHICH NEEDS TO BE COMPLETED AT 8:00 AM .     CLEAR LIQUID DIET   Foods Allowed                                                                     Foods Excluded  Coffee and tea, regular and decaf                             liquids that you cannot  Plain Jell-O any favor except red or purple                                           see through such as: Fruit ices (not with fruit pulp)                                     milk, soups, orange juice  Iced Popsicles                                    All solid food Carbonated beverages, regular and diet                                    Cranberry, grape and apple juices Sports drinks like Gatorade Lightly seasoned clear broth or consume(fat free) Sugar, honey syrup   _____________________________________________________________________    Take these medicines the morning of surgery with A SIP OF WATER: Atorvastatin (Lipitor), Vicodin prn, and Omeprazole (Prilosec)  BRUSH YOUR TEETH MORNING OF SURGERY AND RINSE  YOUR MOUTH OUT, NO CHEWING GUM CANDY OR MINTS.                              You may not have any metal on  your body including hair pins and              piercings    Do not wear jewelry, make-up, lotions, powders or perfumes, deodorant              Do not wear nail polish on your fingernails.  Do not shave  48 hours prior to surgery.                 Do not bring valuables to the hospital. [place].  Contacts, dentures or bridgework may not be worn into surgery.  You may bring an overnight bag     Special Instructions: N/A              Please read over the following fact sheets you were given: _____________________________________________________________________             [facility] - Preparing for Surgery Before surgery, you can play an important role.  Because skin is not sterile, your skin needs to be as free of germs as possible.  You can reduce the number of germs on your skin by washing with CHG (chlorahexidine gluconate) soap before surgery.  CHG is an antiseptic cleaner which kills germs and bonds with the skin to continue killing germs even after washing. Please DO NOT use if you have an allergy to CHG or antibacterial soaps.  If your skin becomes reddened/irritated stop using the CHG and inform your nurse when you arrive at Short Stay. Do not shave (including legs and underarms) for at least 48 hours prior to the first CHG shower.  You may shave your face/neck. Please follow these instructions carefully:  1.  Shower with CHG Soap the night before surgery and the  morning of Surgery.  2.  If you choose to wash your hair, wash your hair first as usual with your  normal  shampoo.  3.  After you shampoo, rinse your hair and body thoroughly to remove the  shampoo.                           4.  Use CHG as you would any other liquid soap.  You can apply chg directly  to the skin and wash                       Gently with a  scrungie or clean washcloth.  5.  Apply the CHG Soap to your body ONLY FROM THE NECK DOWN.   Do not use on face/ open                           Wound or open sores. Avoid contact with eyes, ears mouth and genitals (private parts).                       Wash face,  Genitals (private parts) with your normal soap.             6.  Wash thoroughly, paying special attention to the area where your surgery  will be performed.  7.  Thoroughly rinse your body with warm water from the neck down.  8.  DO NOT shower/wash with your normal soap after using and rinsing off  the CHG Soap.  9.  Pat yourself dry with a clean towel.            10.  Wear clean pajamas.            11.  Place clean sheets on your bed the night of your first shower and do not  sleep with pets. Day of Surgery : Do not apply any lotions/deodorants the morning of surgery.  Please wear clean clothes to the hospital/surgery center.  FAILURE TO FOLLOW THESE INSTRUCTIONS MAY RESULT IN THE CANCELLATION OF YOUR SURGERY PATIENT SIGNATURE_________________________________  NURSE SIGNATURE__________________________________  ________________________________________________________________________

## 2019-06-12 NOTE — Progress Notes (Signed)
PCP - Penni Bombard, PA Cardiologist -   Chest x-ray -  EKG - 02-08-19 Stress Test -  ECHO - 01-07-18 Cardiac Cath -   Sleep Study -  CPAP -   Fasting Blood Sugar -  Checks Blood Sugar _____ times a day  Blood Thinner Instructions: Aspirin Instructions: Last Dose:  Anesthesia review:   Patient denies shortness of breath, fever, cough and chest pain at PAT appointment   Patient verbalized understanding of instructions that were given to them at the PAT appointment. Patient was also instructed that they will need to review over the PAT instructions again at home before surgery.

## 2019-06-15 ENCOUNTER — Other Ambulatory Visit: Payer: Medicare Other

## 2019-06-16 ENCOUNTER — Other Ambulatory Visit: Payer: Self-pay

## 2019-06-16 ENCOUNTER — Encounter (HOSPITAL_COMMUNITY): Payer: Self-pay

## 2019-06-16 ENCOUNTER — Encounter (HOSPITAL_COMMUNITY): Admission: RE | Admit: 2019-06-16 | Payer: Medicare Other | Source: Ambulatory Visit

## 2019-06-16 ENCOUNTER — Encounter (HOSPITAL_COMMUNITY)
Admission: RE | Admit: 2019-06-16 | Discharge: 2019-06-16 | Disposition: A | Discharge status: Home / Self Care | Payer: Medicare Other | Source: Ambulatory Visit | Attending: General Surgery | Admitting: General Surgery

## 2019-06-21 ENCOUNTER — Encounter (HOSPITAL_COMMUNITY)
Admission: RE | Admit: 2019-06-21 | Discharge: 2019-06-21 | Disposition: A | Discharge status: Home / Self Care | Payer: Medicare Other | Source: Ambulatory Visit | Attending: General Surgery | Admitting: General Surgery

## 2019-06-21 ENCOUNTER — Other Ambulatory Visit: Payer: Self-pay

## 2019-06-21 DIAGNOSIS — Z01812 Encounter for preprocedural laboratory examination: Secondary | ICD-10-CM | POA: Diagnosis present

## 2019-06-21 LAB — COMPREHENSIVE METABOLIC PANEL
ALT: 31 U/L (ref 0–44)
AST: 24 U/L (ref 15–41)
Albumin: 4 g/dL (ref 3.5–5.0)
Alkaline Phosphatase: 75 U/L (ref 38–126)
Anion gap: 6 (ref 5–15)
BUN: 12 mg/dL (ref 6–20)
CO2: 26 mmol/L (ref 22–32)
Calcium: 8.8 mg/dL — ABNORMAL LOW (ref 8.9–10.3)
Chloride: 107 mmol/L (ref 98–111)
Creatinine, Ser: 0.63 mg/dL (ref 0.44–1.00)
GFR calc Af Amer: >60 mL/min (ref >60)
GFR calc non Af Amer: >60 mL/min (ref >60)
Glucose, Bld: 107 mg/dL — ABNORMAL HIGH (ref 70–99)
Potassium: 3.7 mmol/L (ref 3.5–5.1)
Sodium: 139 mmol/L (ref 135–145)
Total Bilirubin: 0.6 mg/dL (ref 0.3–1.2)
Total Protein: 7 g/dL (ref 6.5–8.1)

## 2019-06-21 LAB — CBC WITH DIFFERENTIAL/PLATELET
Abs Immature Granulocytes: 0.03 10*3/uL (ref 0.00–0.07)
Basophils Absolute: 0 10*3/uL (ref 0.0–0.1)
Basophils Relative: 1 %
Eosinophils Absolute: 0.1 10*3/uL (ref 0.0–0.5)
Eosinophils Relative: 1 %
HCT: 40.3 % (ref 36.0–46.0)
Hemoglobin: 13 g/dL (ref 12.0–15.0)
Immature Granulocytes: 1 %
Lymphocytes Relative: 23 %
Lymphs Abs: 1.2 10*3/uL (ref 0.7–4.0)
MCH: 30.8 pg (ref 26.0–34.0)
MCHC: 32.3 g/dL (ref 30.0–36.0)
MCV: 95.5 fL (ref 80.0–100.0)
Monocytes Absolute: 0.2 10*3/uL (ref 0.1–1.0)
Monocytes Relative: 3 %
Neutro Abs: 3.9 10*3/uL (ref 1.7–7.7)
Neutrophils Relative %: 71 %
Platelets: 260 10*3/uL (ref 150–400)
RBC: 4.22 MIL/uL (ref 3.87–5.11)
RDW: 12.8 % (ref 11.5–15.5)
WBC: 5.4 10*3/uL (ref 4.0–10.5)
nRBC: 0 % (ref 0.0–0.2)

## 2019-06-21 LAB — ABO/RH: ABO/RH(D): O POS

## 2019-06-23 ENCOUNTER — Other Ambulatory Visit (HOSPITAL_COMMUNITY)
Admission: RE | Admit: 2019-06-23 | Discharge: 2019-06-23 | Disposition: A | Discharge status: Home / Self Care | Payer: Medicare Other | Source: Ambulatory Visit | Attending: General Surgery | Admitting: General Surgery

## 2019-06-23 DIAGNOSIS — U071 COVID-19: Secondary | ICD-10-CM | POA: Diagnosis not present

## 2019-06-23 DIAGNOSIS — Z01812 Encounter for preprocedural laboratory examination: Secondary | ICD-10-CM | POA: Diagnosis present

## 2019-06-23 LAB — SARS CORONAVIRUS 2 (TAT 6-24 HRS): SARS Coronavirus 2: POSITIVE — AB

## 2019-06-24 ENCOUNTER — Telehealth: Payer: Self-pay | Admitting: Physician Assistant

## 2019-06-24 ENCOUNTER — Encounter: Payer: Self-pay | Admitting: Physician Assistant

## 2019-06-24 NOTE — Telephone Encounter (Signed)
Called to discuss with Toni Gutierrez about Covid symptoms and the use of bamlanivimab/etesevimab or casirivimab/imdevimab, a monoclonal antibody infusion for those with mild to moderate Covid symptoms and at a high risk of hospitalization.   Pt was screened with a covid 19 test in anticipation of upcoming hernia repair. Pt does not qualify for infusion therapy as pt has asymptomatic infection. Isolation precautions discussed. Advised to contact back for consideration should they develop symptoms. Patient verbalized understanding. She has our hotline number in the case she develops symptoms and we will get her signed up.      Patient Active Problem List   Diagnosis Date Noted  . AMS (altered mental status) 01/06/2018  . Polypharmacy   . Chronic prescription opiate use 03/25/2016  . Essential hypertension, malignant 07/29/2015  . Rhinitis, allergic 07/29/2015  . GERD (gastroesophageal reflux disease) 11/17/2012  . Chronic back pain 11/17/2012  . Dental caries 11/17/2012  . Sciatica 11/17/2012  . Hypokalemia 08/16/2012  . IT band syndrome 11/16/2011  . Right leg pain 08/31/2011  . Mononeuropathy of left upper extremity 07/15/2010  . Preventative health care 03/27/2010  . Acute pain of left shoulder 03/02/2007  . PAIN, CHRONIC NEC 04/27/2006  . HYPERLIPIDEMIA 03/02/2006  . Alcohol abuse 11/20/2005  . CARPAL TUNNEL SYNDROME, RIGHT 11/20/2005  . ALLERGIC RHINITIS 11/20/2005    Angelena Form PA-C

## 2019-06-24 NOTE — Progress Notes (Signed)
Toni Gutierrez from the on-call service for Dr. Greer Pickerel with + covid results for this pt from Fri 4/30. The pt has a scheduled procedure on Tues 5/4 at [facility]. These are the current guidelines:  Positive Results for:  Asymptomatic: Procedure postponed and quarantine for 10 days. Symptomatic: Procedure postponed and quarantine for 14 days. Immunocompromised: Procedure postponed and quarantine for 20 days.  The pt will not be retested for 90 days from the + result.

## 2019-06-26 MED ORDER — BUPIVACAINE LIPOSOME 1.3 % IJ SUSP
20.0000 mL | INTRAMUSCULAR | Status: AC
  Filled 2019-06-26: qty 20

## 2019-06-27 LAB — TYPE AND SCREEN
ABO/RH(D): O POS
Antibody Screen: NEGATIVE

## 2019-07-05 ENCOUNTER — Ambulatory Visit
Admission: RE | Admit: 2019-07-05 | Discharge: 2019-07-05 | Disposition: A | Discharge status: Home / Self Care | Payer: Medicare Other | Source: Ambulatory Visit | Attending: Family | Admitting: Family

## 2019-07-05 ENCOUNTER — Other Ambulatory Visit: Payer: Self-pay

## 2019-07-05 ENCOUNTER — Ambulatory Visit: Payer: Medicare Other

## 2019-07-05 DIAGNOSIS — R928 Other abnormal and inconclusive findings on diagnostic imaging of breast: Secondary | ICD-10-CM

## 2019-07-07 ENCOUNTER — Other Ambulatory Visit (HOSPITAL_COMMUNITY): Payer: Medicare Other

## 2019-07-10 ENCOUNTER — Other Ambulatory Visit: Payer: Self-pay

## 2019-07-10 ENCOUNTER — Encounter (HOSPITAL_COMMUNITY)
Admission: RE | Admit: 2019-07-10 | Discharge: 2019-07-10 | Disposition: A | Discharge status: Home / Self Care | Payer: Medicare Other | Source: Ambulatory Visit | Attending: General Surgery | Admitting: General Surgery

## 2019-07-10 DIAGNOSIS — K219 Gastro-esophageal reflux disease without esophagitis: Secondary | ICD-10-CM | POA: Diagnosis not present

## 2019-07-10 DIAGNOSIS — Z23 Encounter for immunization: Secondary | ICD-10-CM | POA: Diagnosis not present

## 2019-07-10 DIAGNOSIS — K449 Diaphragmatic hernia without obstruction or gangrene: Secondary | ICD-10-CM | POA: Diagnosis not present

## 2019-07-10 DIAGNOSIS — Z791 Long term (current) use of non-steroidal anti-inflammatories (NSAID): Secondary | ICD-10-CM | POA: Diagnosis not present

## 2019-07-10 DIAGNOSIS — M545 Low back pain: Secondary | ICD-10-CM | POA: Diagnosis not present

## 2019-07-10 DIAGNOSIS — Z79899 Other long term (current) drug therapy: Secondary | ICD-10-CM | POA: Diagnosis not present

## 2019-07-10 DIAGNOSIS — I10 Essential (primary) hypertension: Secondary | ICD-10-CM | POA: Diagnosis not present

## 2019-07-10 DIAGNOSIS — D649 Anemia, unspecified: Secondary | ICD-10-CM | POA: Diagnosis not present

## 2019-07-10 DIAGNOSIS — G8929 Other chronic pain: Secondary | ICD-10-CM | POA: Diagnosis not present

## 2019-07-10 DIAGNOSIS — E785 Hyperlipidemia, unspecified: Secondary | ICD-10-CM | POA: Diagnosis not present

## 2019-07-10 LAB — COMPREHENSIVE METABOLIC PANEL
ALT: 20 U/L (ref 0–44)
AST: 22 U/L (ref 15–41)
Albumin: 4 g/dL (ref 3.5–5.0)
Alkaline Phosphatase: 72 U/L (ref 38–126)
Anion gap: 8 (ref 5–15)
BUN: 15 mg/dL (ref 6–20)
CO2: 25 mmol/L (ref 22–32)
Calcium: 8.8 mg/dL — ABNORMAL LOW (ref 8.9–10.3)
Chloride: 109 mmol/L (ref 98–111)
Creatinine, Ser: 0.59 mg/dL (ref 0.44–1.00)
GFR calc Af Amer: >60 mL/min (ref >60)
GFR calc non Af Amer: >60 mL/min (ref >60)
Glucose, Bld: 87 mg/dL (ref 70–99)
Potassium: 3.5 mmol/L (ref 3.5–5.1)
Sodium: 142 mmol/L (ref 135–145)
Total Bilirubin: 0.6 mg/dL (ref 0.3–1.2)
Total Protein: 6.9 g/dL (ref 6.5–8.1)

## 2019-07-10 LAB — CBC WITH DIFFERENTIAL/PLATELET
Abs Immature Granulocytes: 0.01 10*3/uL (ref 0.00–0.07)
Basophils Absolute: 0 10*3/uL (ref 0.0–0.1)
Basophils Relative: 1 %
Eosinophils Absolute: 0.2 10*3/uL (ref 0.0–0.5)
Eosinophils Relative: 3 %
HCT: 37.3 % (ref 36.0–46.0)
Hemoglobin: 12.6 g/dL (ref 12.0–15.0)
Immature Granulocytes: 0 %
Lymphocytes Relative: 45 %
Lymphs Abs: 2.2 10*3/uL (ref 0.7–4.0)
MCH: 31.4 pg (ref 26.0–34.0)
MCHC: 33.8 g/dL (ref 30.0–36.0)
MCV: 93 fL (ref 80.0–100.0)
Monocytes Absolute: 0.4 10*3/uL (ref 0.1–1.0)
Monocytes Relative: 9 %
Neutro Abs: 2.1 10*3/uL (ref 1.7–7.7)
Neutrophils Relative %: 42 %
Platelets: 269 10*3/uL (ref 150–400)
RBC: 4.01 MIL/uL (ref 3.87–5.11)
RDW: 12.3 % (ref 11.5–15.5)
WBC: 4.9 10*3/uL (ref 4.0–10.5)
nRBC: 0 % (ref 0.0–0.2)

## 2019-07-10 NOTE — Progress Notes (Signed)
DUE TO COVID-19 ONLY ONE VISITOR IS ALLOWED TO COME WITH YOU AND STAY IN THE WAITING ROOM ONLY DURING PRE OP AND PROCEDURE DAY OF SURGERY. THE 1 VISITOR MAY VISIT WITH YOU AFTER SURGERY IN YOUR PRIVATE ROOM DURING VISITING HOURS ONLY!  YOU NEED TO HAVE A COVID 19 TEST ON_______ @_______ , THIS TEST MUST BE DONE BEFORE SURGERY, COME  [address], [address] [place] , [id number].  ([place]) ONCE YOUR COVID TEST IS COMPLETED, PLEASE BEGIN THE QUARANTINE INSTRUCTIONS AS OUTLINED IN YOUR HANDOUT.                [place]  07/10/2019   Your procedure is scheduled on:                   07/11/19   Report to [facility] Main  Entrance   Report to admitting at    [place] AM     Call this number if you have problems the morning of surgery [phone]    Remember: Do not eat food   :After Midnight. BRUSH YOUR TEETH MORNING OF SURGERY AND RINSE YOUR MOUTH OUT, NO CHEWING GUM CANDY OR MINTS.     Take these medicines the morning of surgery with A SIP OF WATER:  Prilosec, Lyrica                                   You may not have any metal on your body including hair pins and              piercings  Do not wear jewelry, make-up, lotions, powders or perfumes, deodorant             Do not wear nail polish on your fingernails.  Do not shave  48 hours prior to surgery.              Do not bring valuables to the hospital. [place].  Contacts, dentures or bridgework may not be worn into surgery.  Leave suitcase in the car. After surgery it may be brought to your room.     Patients discharged the day of surgery will not be allowed to drive home. IF YOU ARE HAVING SURGERY AND GOING HOME THE SAME DAY, YOU MUST HAVE AN ADULT TO DRIVE YOU HOME AND BE WITH YOU FOR 24 HOURS. YOU MAY GO HOME BY TAXI OR UBER OR ORTHERWISE, BUT AN ADULT MUST ACCOMPANY YOU HOME AND STAY WITH YOU FOR 24 HOURS.  Name and phone number of your  driver:                Please read over the following fact sheets you were given: _____________________________________________________________________             NO SOLID FOOD AFTER MIDNIGHT THE NIGHT PRIOR TO SURGERY. NOTHING BY MOUTH EXCEPT CLEAR LIQUIDS UNTIL    0615am . PLEASE FINISH ENSURE DRINK PER SURGEON ORDER  WHICH NEEDS TO BE COMPLETED AT 0615am .   CLEAR LIQUID DIET   Foods Allowed  Foods Excluded  Coffee and tea, regular and decaf                             liquids that you cannot  Plain Jell-O any favor except red or purple                                           see through such as: Fruit ices (not with fruit pulp)                                     milk, soups, orange juice  Iced Popsicles                                    All solid food Carbonated beverages, regular and diet                                    Cranberry, grape and apple juices Sports drinks like Gatorade Lightly seasoned clear broth or consume(fat free) Sugar, honey syrup  Sample Menu Breakfast                                Lunch                                     Supper Cranberry juice                    Beef broth                            Chicken broth Jell-O                                     Grape juice                           Apple juice Coffee or tea                        Jell-O                                      Popsicle                                                Coffee or tea                        Coffee or tea  _____________________________________________________________________  [facility] - Preparing for Surgery Before surgery, you can play an important role.  Because skin is not sterile, your skin  needs to be as free of germs as possible.  You can reduce the number of germs on your skin by washing with CHG (chlorahexidine gluconate) soap before surgery.  CHG is an antiseptic cleaner which kills  germs and bonds with the skin to continue killing germs even after washing. Please DO NOT use if you have an allergy to CHG or antibacterial soaps.  If your skin becomes reddened/irritated stop using the CHG and inform your nurse when you arrive at Short Stay. Do not shave (including legs and underarms) for at least 48 hours prior to the first CHG shower.  You may shave your face/neck. Please follow these instructions carefully:  1.  Shower with CHG Soap the night before surgery and the  morning of Surgery.  2.  If you choose to wash your hair, wash your hair first as usual with your  normal  shampoo.  3.  After you shampoo, rinse your hair and body thoroughly to remove the  shampoo.                           4.  Use CHG as you would any other liquid soap.  You can apply chg directly  to the skin and wash                       Gently with a scrungie or clean washcloth.  5.  Apply the CHG Soap to your body ONLY FROM THE NECK DOWN.   Do not use on face/ open                           Wound or open sores. Avoid contact with eyes, ears mouth and genitals (private parts).                       Wash face,  Genitals (private parts) with your normal soap.             6.  Wash thoroughly, paying special attention to the area where your surgery  will be performed.  7.  Thoroughly rinse your body with warm water from the neck down.  8.  DO NOT shower/wash with your normal soap after using and rinsing off  the CHG Soap.                9.  Pat yourself dry with a clean towel.            10.  Wear clean pajamas.            11.  Place clean sheets on your bed the night of your first shower and do not  sleep with pets. Day of Surgery : Do not apply any lotions/deodorants the morning of surgery.  Please wear clean clothes to the hospital/surgery center.  FAILURE TO FOLLOW THESE INSTRUCTIONS MAY RESULT IN THE CANCELLATION OF YOUR SURGERY PATIENT SIGNATURE_________________________________  NURSE  SIGNATURE__________________________________  ________________________________________________________________________

## 2019-07-10 NOTE — Progress Notes (Signed)
Reviewed medications and history with patient.  No new updates for medical history.  Reviewed instructions with patient.  Patient to come in today on 07/10/19 to have labs completed prior to surgery on 07/11/19.

## 2019-07-10 NOTE — Progress Notes (Signed)
Patient tested positive for Covid on 06/23/19.  Surgery postponed until 07/11/19.

## 2019-07-11 ENCOUNTER — Encounter (HOSPITAL_COMMUNITY)
Admission: RE | Disposition: A | Discharge status: Home / Self Care | Payer: Self-pay | Source: Ambulatory Visit | Attending: General Surgery

## 2019-07-11 ENCOUNTER — Observation Stay (HOSPITAL_COMMUNITY): Payer: Medicare Other

## 2019-07-11 ENCOUNTER — Observation Stay (HOSPITAL_COMMUNITY)
Admission: RE | Admit: 2019-07-11 | Discharge: 2019-07-12 | Disposition: A | Discharge status: Home / Self Care | Payer: Medicare Other | Source: Ambulatory Visit | Attending: General Surgery | Admitting: General Surgery

## 2019-07-11 ENCOUNTER — Inpatient Hospital Stay (HOSPITAL_COMMUNITY): Payer: Medicare Other | Admitting: Anesthesiology

## 2019-07-11 ENCOUNTER — Encounter (HOSPITAL_COMMUNITY): Payer: Self-pay | Admitting: General Surgery

## 2019-07-11 DIAGNOSIS — K219 Gastro-esophageal reflux disease without esophagitis: Secondary | ICD-10-CM | POA: Diagnosis not present

## 2019-07-11 DIAGNOSIS — I10 Essential (primary) hypertension: Secondary | ICD-10-CM | POA: Insufficient documentation

## 2019-07-11 DIAGNOSIS — G8929 Other chronic pain: Secondary | ICD-10-CM | POA: Insufficient documentation

## 2019-07-11 DIAGNOSIS — D649 Anemia, unspecified: Secondary | ICD-10-CM | POA: Insufficient documentation

## 2019-07-11 DIAGNOSIS — E785 Hyperlipidemia, unspecified: Secondary | ICD-10-CM | POA: Diagnosis not present

## 2019-07-11 DIAGNOSIS — Z23 Encounter for immunization: Secondary | ICD-10-CM | POA: Insufficient documentation

## 2019-07-11 DIAGNOSIS — Z9889 Other specified postprocedural states: Secondary | ICD-10-CM

## 2019-07-11 DIAGNOSIS — Z791 Long term (current) use of non-steroidal anti-inflammatories (NSAID): Secondary | ICD-10-CM | POA: Insufficient documentation

## 2019-07-11 DIAGNOSIS — Z79899 Other long term (current) drug therapy: Secondary | ICD-10-CM | POA: Insufficient documentation

## 2019-07-11 DIAGNOSIS — Z8719 Personal history of other diseases of the digestive system: Secondary | ICD-10-CM

## 2019-07-11 DIAGNOSIS — K449 Diaphragmatic hernia without obstruction or gangrene: Principal | ICD-10-CM | POA: Insufficient documentation

## 2019-07-11 DIAGNOSIS — M545 Low back pain: Secondary | ICD-10-CM | POA: Insufficient documentation

## 2019-07-11 HISTORY — PX: HIATAL HERNIA REPAIR: SHX195

## 2019-07-11 HISTORY — PX: ESOPHAGOGASTRODUODENOSCOPY: SHX5428

## 2019-07-11 LAB — TYPE AND SCREEN
ABO/RH(D): O POS
Antibody Screen: NEGATIVE

## 2019-07-11 LAB — GLUCOSE, CAPILLARY: Glucose-Capillary: 191 mg/dL — ABNORMAL HIGH (ref 70–99)

## 2019-07-11 SURGERY — REPAIR, HERNIA, HIATAL, LAPAROSCOPIC
Anesthesia: General | Site: Esophagus

## 2019-07-11 MED ORDER — LABETALOL HCL 5 MG/ML IV SOLN
INTRAVENOUS | Status: AC
  Filled 2019-07-11: qty 4

## 2019-07-11 MED ORDER — ACETAMINOPHEN 500 MG PO TABS
1000.0000 mg | ORAL_TABLET | Freq: Four times a day (QID) | ORAL | Status: DC
  Administered 2019-07-11 – 2019-07-12 (×4): 1000 mg via ORAL
  Filled 2019-07-11 (×4): qty 2

## 2019-07-11 MED ORDER — KETOROLAC TROMETHAMINE 30 MG/ML IJ SOLN
INTRAMUSCULAR | Status: DC | PRN
  Administered 2019-07-11: 30 mg via INTRAVENOUS

## 2019-07-11 MED ORDER — MIDAZOLAM HCL 2 MG/2ML IJ SOLN
INTRAMUSCULAR | Status: AC
  Filled 2019-07-11: qty 2

## 2019-07-11 MED ORDER — KETOROLAC TROMETHAMINE 15 MG/ML IJ SOLN
15.0000 mg | Freq: Four times a day (QID) | INTRAMUSCULAR | Status: DC | PRN
  Administered 2019-07-11: 15 mg via INTRAVENOUS
  Filled 2019-07-11: qty 1

## 2019-07-11 MED ORDER — KETOROLAC TROMETHAMINE 30 MG/ML IJ SOLN
INTRAMUSCULAR | Status: AC
  Filled 2019-07-11: qty 1

## 2019-07-11 MED ORDER — BUPIVACAINE HCL 0.25 % IJ SOLN
INTRAMUSCULAR | Status: AC
  Filled 2019-07-11: qty 1

## 2019-07-11 MED ORDER — FENTANYL CITRATE (PF) 250 MCG/5ML IJ SOLN
INTRAMUSCULAR | Status: AC
  Filled 2019-07-11: qty 5

## 2019-07-11 MED ORDER — PHENYLEPHRINE HCL-NACL 20-0.9 MG/250ML-% IV SOLN
INTRAVENOUS | Status: DC | PRN
  Administered 2019-07-11: 25 ug/min via INTRAVENOUS

## 2019-07-11 MED ORDER — ROCURONIUM BROMIDE 10 MG/ML (PF) SYRINGE
PREFILLED_SYRINGE | INTRAVENOUS | Status: AC
  Filled 2019-07-11: qty 10

## 2019-07-11 MED ORDER — KETAMINE HCL 10 MG/ML IJ SOLN
INTRAMUSCULAR | Status: DC | PRN
  Administered 2019-07-11: 30 mg via INTRAVENOUS

## 2019-07-11 MED ORDER — LIDOCAINE HCL 2 % IJ SOLN
INTRAMUSCULAR | Status: AC
  Filled 2019-07-11: qty 20

## 2019-07-11 MED ORDER — 0.9 % SODIUM CHLORIDE (POUR BTL) OPTIME
TOPICAL | Status: DC | PRN
  Administered 2019-07-11: 1000 mL

## 2019-07-11 MED ORDER — PROMETHAZINE HCL 25 MG/ML IJ SOLN: 12.5000 mg | Freq: Four times a day (QID) | INTRAMUSCULAR | Status: DC | PRN

## 2019-07-11 MED ORDER — EPHEDRINE SULFATE-NACL 50-0.9 MG/10ML-% IV SOSY
PREFILLED_SYRINGE | INTRAVENOUS | Status: DC | PRN
  Administered 2019-07-11 (×3): 10 mg via INTRAVENOUS

## 2019-07-11 MED ORDER — DEXAMETHASONE SODIUM PHOSPHATE 10 MG/ML IJ SOLN
INTRAMUSCULAR | Status: AC
  Filled 2019-07-11: qty 1

## 2019-07-11 MED ORDER — BUPIVACAINE LIPOSOME 1.3 % IJ SUSP
20.0000 mL | Freq: Once | INTRAMUSCULAR | Status: DC
  Filled 2019-07-11: qty 20

## 2019-07-11 MED ORDER — PNEUMOCOCCAL VAC POLYVALENT 25 MCG/0.5ML IJ INJ
0.5000 mL | INJECTION | INTRAMUSCULAR | Status: AC
  Administered 2019-07-12: 0.5 mL via INTRAMUSCULAR
  Filled 2019-07-11: qty 0.5

## 2019-07-11 MED ORDER — BUPIVACAINE LIPOSOME 1.3 % IJ SUSP
INTRAMUSCULAR | Status: DC | PRN
  Administered 2019-07-11: 20 mL

## 2019-07-11 MED ORDER — HYDROMORPHONE HCL 1 MG/ML IJ SOLN
0.2500 mg | INTRAMUSCULAR | Status: DC | PRN
  Administered 2019-07-11 (×4): 0.5 mg via INTRAVENOUS

## 2019-07-11 MED ORDER — LOSARTAN POTASSIUM 50 MG PO TABS
50.0000 mg | ORAL_TABLET | Freq: Every day | ORAL | Status: DC
  Administered 2019-07-12: 50 mg via ORAL
  Filled 2019-07-11: qty 1

## 2019-07-11 MED ORDER — DEXAMETHASONE SODIUM PHOSPHATE 4 MG/ML IJ SOLN: 4.0000 mg | INTRAMUSCULAR | Status: DC

## 2019-07-11 MED ORDER — OXYCODONE HCL 5 MG PO TABS
5.0000 mg | ORAL_TABLET | Freq: Once | ORAL | Status: AC | PRN
  Administered 2019-07-11: 5 mg via ORAL

## 2019-07-11 MED ORDER — MIDAZOLAM HCL 2 MG/2ML IJ SOLN
INTRAMUSCULAR | Status: DC | PRN
  Administered 2019-07-11: 2 mg via INTRAVENOUS

## 2019-07-11 MED ORDER — LIDOCAINE 2% (20 MG/ML) 5 ML SYRINGE
INTRAMUSCULAR | Status: DC | PRN
  Administered 2019-07-11: 1.5 mg/kg/h via INTRAVENOUS

## 2019-07-11 MED ORDER — LIDOCAINE 2% (20 MG/ML) 5 ML SYRINGE
INTRAMUSCULAR | Status: AC
  Filled 2019-07-11: qty 5

## 2019-07-11 MED ORDER — BUPIVACAINE HCL (PF) 0.25 % IJ SOLN
INTRAMUSCULAR | Status: DC | PRN
  Administered 2019-07-11: 30 mL

## 2019-07-11 MED ORDER — ONDANSETRON 4 MG PO TBDP
4.0000 mg | ORAL_TABLET | Freq: Four times a day (QID) | ORAL | Status: DC
  Administered 2019-07-11 – 2019-07-12 (×5): 4 mg via ORAL
  Filled 2019-07-11 (×5): qty 1

## 2019-07-11 MED ORDER — SCOPOLAMINE 1 MG/3DAYS TD PT72
1.0000 | MEDICATED_PATCH | TRANSDERMAL | Status: DC
  Administered 2019-07-11: 1.5 mg via TRANSDERMAL
  Filled 2019-07-11: qty 1

## 2019-07-11 MED ORDER — ONDANSETRON HCL 4 MG/2ML IJ SOLN
INTRAMUSCULAR | Status: DC | PRN
  Administered 2019-07-11: 4 mg via INTRAVENOUS

## 2019-07-11 MED ORDER — OXYCODONE HCL 5 MG PO TABS
ORAL_TABLET | ORAL | Status: AC
  Filled 2019-07-11: qty 1

## 2019-07-11 MED ORDER — LACTATED RINGERS IR SOLN
Status: DC | PRN
  Administered 2019-07-11: 1000 mL

## 2019-07-11 MED ORDER — ROCURONIUM BROMIDE 10 MG/ML (PF) SYRINGE
PREFILLED_SYRINGE | INTRAVENOUS | Status: DC | PRN
  Administered 2019-07-11: 20 mg via INTRAVENOUS
  Administered 2019-07-11: 50 mg via INTRAVENOUS

## 2019-07-11 MED ORDER — ENSURE PRE-SURGERY PO LIQD: 296.0000 mL | Freq: Once | ORAL | Status: DC

## 2019-07-11 MED ORDER — GABAPENTIN 100 MG PO CAPS
100.0000 mg | ORAL_CAPSULE | ORAL | Status: AC
  Administered 2019-07-11: 100 mg via ORAL
  Filled 2019-07-11: qty 1

## 2019-07-11 MED ORDER — DIPHENHYDRAMINE HCL 12.5 MG/5ML PO ELIX: 12.5000 mg | ORAL_SOLUTION | Freq: Four times a day (QID) | ORAL | Status: DC | PRN

## 2019-07-11 MED ORDER — CHLORHEXIDINE GLUCONATE CLOTH 2 % EX PADS: 6.0000 | MEDICATED_PAD | Freq: Once | CUTANEOUS | Status: DC

## 2019-07-11 MED ORDER — ONDANSETRON HCL 4 MG/2ML IJ SOLN
INTRAMUSCULAR | Status: AC
  Filled 2019-07-11: qty 2

## 2019-07-11 MED ORDER — ACETAMINOPHEN 500 MG PO TABS
1000.0000 mg | ORAL_TABLET | ORAL | Status: AC
  Administered 2019-07-11: 1000 mg via ORAL
  Filled 2019-07-11: qty 2

## 2019-07-11 MED ORDER — FENTANYL CITRATE (PF) 250 MCG/5ML IJ SOLN
INTRAMUSCULAR | Status: DC | PRN
  Administered 2019-07-11 (×4): 50 ug via INTRAVENOUS
  Administered 2019-07-11: 100 ug via INTRAVENOUS
  Administered 2019-07-11: 50 ug via INTRAVENOUS

## 2019-07-11 MED ORDER — ENALAPRILAT 1.25 MG/ML IV SOLN
1.2500 mg | Freq: Four times a day (QID) | INTRAVENOUS | Status: DC | PRN
  Administered 2019-07-12 (×2): 1.25 mg via INTRAVENOUS
  Filled 2019-07-11 (×4): qty 1

## 2019-07-11 MED ORDER — DIPHENHYDRAMINE HCL 50 MG/ML IJ SOLN: 12.5000 mg | Freq: Four times a day (QID) | INTRAMUSCULAR | Status: DC | PRN

## 2019-07-11 MED ORDER — KETAMINE HCL 10 MG/ML IJ SOLN
INTRAMUSCULAR | Status: AC
  Filled 2019-07-11: qty 1

## 2019-07-11 MED ORDER — KCL IN DEXTROSE-NACL 20-5-0.45 MEQ/L-%-% IV SOLN
INTRAVENOUS | Status: DC
  Filled 2019-07-11 (×3): qty 1000

## 2019-07-11 MED ORDER — MORPHINE SULFATE (PF) 2 MG/ML IV SOLN
1.0000 mg | INTRAVENOUS | Status: DC | PRN
  Administered 2019-07-11 – 2019-07-12 (×3): 2 mg via INTRAVENOUS
  Filled 2019-07-11 (×3): qty 1

## 2019-07-11 MED ORDER — OXYCODONE HCL 5 MG/5ML PO SOLN
5.0000 mg | ORAL | Status: DC | PRN
  Administered 2019-07-11 – 2019-07-12 (×2): 5 mg via ORAL
  Filled 2019-07-11 (×2): qty 5

## 2019-07-11 MED ORDER — FENTANYL CITRATE (PF) 100 MCG/2ML IJ SOLN
INTRAMUSCULAR | Status: AC
  Filled 2019-07-11: qty 2

## 2019-07-11 MED ORDER — ENOXAPARIN SODIUM 40 MG/0.4ML ~~LOC~~ SOLN
40.0000 mg | SUBCUTANEOUS | Status: DC
  Administered 2019-07-12: 40 mg via SUBCUTANEOUS
  Filled 2019-07-11: qty 0.4

## 2019-07-11 MED ORDER — CEFAZOLIN SODIUM-DEXTROSE 2-4 GM/100ML-% IV SOLN
2.0000 g | INTRAVENOUS | Status: AC
  Administered 2019-07-11: 2 g via INTRAVENOUS
  Filled 2019-07-11: qty 100

## 2019-07-11 MED ORDER — DEXAMETHASONE SODIUM PHOSPHATE 10 MG/ML IJ SOLN
INTRAMUSCULAR | Status: DC | PRN
  Administered 2019-07-11: 6 mg via INTRAVENOUS

## 2019-07-11 MED ORDER — FENTANYL CITRATE (PF) 100 MCG/2ML IJ SOLN
25.0000 ug | INTRAMUSCULAR | Status: DC | PRN
  Administered 2019-07-11 (×2): 50 ug via INTRAVENOUS

## 2019-07-11 MED ORDER — HEPARIN SODIUM (PORCINE) 5000 UNIT/ML IJ SOLN
5000.0000 [IU] | Freq: Once | INTRAMUSCULAR | Status: AC
  Administered 2019-07-11: 5000 [IU] via SUBCUTANEOUS
  Filled 2019-07-11: qty 1

## 2019-07-11 MED ORDER — PROPOFOL 10 MG/ML IV BOLUS
INTRAVENOUS | Status: AC
  Filled 2019-07-11: qty 20

## 2019-07-11 MED ORDER — KETOROLAC TROMETHAMINE 30 MG/ML IJ SOLN: 30.0000 mg | Freq: Once | INTRAMUSCULAR | Status: DC | PRN

## 2019-07-11 MED ORDER — ALBUMIN HUMAN 5 % IV SOLN
INTRAVENOUS | Status: AC
  Filled 2019-07-11: qty 250

## 2019-07-11 MED ORDER — PHENYLEPHRINE HCL (PRESSORS) 10 MG/ML IV SOLN
INTRAVENOUS | Status: AC
  Filled 2019-07-11: qty 2

## 2019-07-11 MED ORDER — LACTATED RINGERS IV SOLN: INTRAVENOUS | Status: DC

## 2019-07-11 MED ORDER — PROPOFOL 10 MG/ML IV BOLUS
INTRAVENOUS | Status: DC | PRN
  Administered 2019-07-11: 150 mg via INTRAVENOUS

## 2019-07-11 MED ORDER — ALBUMIN HUMAN 5 % IV SOLN: INTRAVENOUS | Status: DC | PRN

## 2019-07-11 MED ORDER — EPHEDRINE 5 MG/ML INJ
INTRAVENOUS | Status: AC
  Filled 2019-07-11: qty 10

## 2019-07-11 MED ORDER — SUGAMMADEX SODIUM 200 MG/2ML IV SOLN
INTRAVENOUS | Status: DC | PRN
  Administered 2019-07-11: 120 mg via INTRAVENOUS

## 2019-07-11 MED ORDER — PANTOPRAZOLE SODIUM 40 MG IV SOLR
40.0000 mg | Freq: Every day | INTRAVENOUS | Status: DC
  Administered 2019-07-11: 40 mg via INTRAVENOUS
  Filled 2019-07-11: qty 40

## 2019-07-11 MED ORDER — SUCCINYLCHOLINE CHLORIDE 200 MG/10ML IV SOSY
PREFILLED_SYRINGE | INTRAVENOUS | Status: DC | PRN
  Administered 2019-07-11: 140 mg via INTRAVENOUS

## 2019-07-11 MED ORDER — PREGABALIN 75 MG PO CAPS
75.0000 mg | ORAL_CAPSULE | Freq: Two times a day (BID) | ORAL | Status: DC
  Administered 2019-07-11 – 2019-07-12 (×2): 75 mg via ORAL
  Filled 2019-07-11 (×2): qty 1

## 2019-07-11 MED ORDER — LABETALOL HCL 5 MG/ML IV SOLN
INTRAVENOUS | Status: DC | PRN
  Administered 2019-07-11: 5 mg via INTRAVENOUS

## 2019-07-11 MED ORDER — LIDOCAINE 2% (20 MG/ML) 5 ML SYRINGE
INTRAMUSCULAR | Status: DC | PRN
  Administered 2019-07-11: 100 mg via INTRAVENOUS

## 2019-07-11 MED ORDER — SIMETHICONE 80 MG PO CHEW: 40.0000 mg | CHEWABLE_TABLET | Freq: Four times a day (QID) | ORAL | Status: DC | PRN

## 2019-07-11 MED ORDER — HYDROMORPHONE HCL 1 MG/ML IJ SOLN
INTRAMUSCULAR | Status: AC
  Filled 2019-07-11: qty 2

## 2019-07-11 SURGICAL SUPPLY — 54 items
APPLIER CLIP ROT 10 11.4 M/L (STAPLE)
BENZOIN TINCTURE PRP APPL 2/3 (GAUZE/BANDAGES/DRESSINGS) IMPLANT
CABLE HIGH FREQUENCY MONO STRZ (ELECTRODE) IMPLANT
CHLORAPREP W/TINT 26 (MISCELLANEOUS) ×5 IMPLANT
CLIP APPLIE ROT 10 11.4 M/L (STAPLE) IMPLANT
CLOSURE WOUND 1/2 X4 (GAUZE/BANDAGES/DRESSINGS)
COVER WAND RF STERILE (DRAPES) IMPLANT
DECANTER SPIKE VIAL GLASS SM (MISCELLANEOUS) ×5 IMPLANT
DEVICE SUT QUICK LOAD TK 5 (STAPLE) ×36 IMPLANT
DEVICE SUT TI-KNOT TK 5X26 (MISCELLANEOUS) ×4 IMPLANT
DEVICE SUTURE ENDOST 10MM (ENDOMECHANICALS) ×5 IMPLANT
DEVICE TI KNOT TK5 (MISCELLANEOUS) ×1
DISSECTOR BLUNT TIP ENDO 5MM (MISCELLANEOUS) ×5 IMPLANT
DRAIN PENROSE 0.5X18 (DRAIN) ×5 IMPLANT
ELECT L-HOOK LAP 45CM DISP (ELECTROSURGICAL) ×5
ELECT REM PT RETURN 15FT ADLT (MISCELLANEOUS) ×5 IMPLANT
ELECTRODE L-HOOK LAP 45CM DISP (ELECTROSURGICAL) ×3 IMPLANT
GLOVE BIO SURGEON STRL SZ7.5 (GLOVE) ×5 IMPLANT
GLOVE BIOGEL PI IND STRL 7.0 (GLOVE) IMPLANT
GLOVE BIOGEL PI INDICATOR 7.0 (GLOVE)
GLOVE INDICATOR 8.0 STRL GRN (GLOVE) ×5 IMPLANT
GOWN STRL REUS W/TWL LRG LVL3 (GOWN DISPOSABLE) ×5 IMPLANT
GOWN STRL REUS W/TWL XL LVL3 (GOWN DISPOSABLE) ×15 IMPLANT
GRASPER SUT TROCAR 14GX15 (MISCELLANEOUS) ×5 IMPLANT
KIT BASIN (CUSTOM PROCEDURE TRAY) ×5 IMPLANT
KIT TURNOVER KIT A (KITS) IMPLANT
NS IRRIG 1000ML POUR BTL (IV SOLUTION) ×5 IMPLANT
PACK UNIVERSAL I (CUSTOM PROCEDURE TRAY) ×5 IMPLANT
PENCIL SMOKE EVACUATOR (MISCELLANEOUS) IMPLANT
POUCH RETRIEVAL ECOSAC 10 (ENDOMECHANICALS) ×3 IMPLANT
POUCH RETRIEVAL ECOSAC 10MM (ENDOMECHANICALS) ×5
QUICK LOAD TK 5 (STAPLE) ×9
SCISSORS LAP 5X45 EPIX DISP (ENDOMECHANICALS) ×5 IMPLANT
SET IRRIG TUBING LAPAROSCOPIC (IRRIGATION / IRRIGATOR) ×5 IMPLANT
SET TUBE SMOKE EVAC HIGH FLOW (TUBING) ×5 IMPLANT
SHEARS HARMONIC ACE PLUS 45CM (MISCELLANEOUS) ×5 IMPLANT
SLEEVE XCEL OPT CAN 5 100 (ENDOMECHANICALS) ×15 IMPLANT
STRIP CLOSURE SKIN 1/2X4 (GAUZE/BANDAGES/DRESSINGS) IMPLANT
SUT ETHIBOND 0 36 GRN (SUTURE) ×35 IMPLANT
SUT ETHIBOND 2 0 SH (SUTURE) ×15
SUT ETHIBOND 2 0 SH 36X2 (SUTURE) ×9 IMPLANT
SUT MNCRL AB 4-0 PS2 18 (SUTURE) ×5 IMPLANT
SUT SURGIDAC NAB ES-9 0 48 120 (SUTURE) ×30 IMPLANT
TIP INNERVISION DETACH 40FR (MISCELLANEOUS) IMPLANT
TIP INNERVISION DETACH 50FR (MISCELLANEOUS) IMPLANT
TIP INNERVISION DETACH 56FR (MISCELLANEOUS) ×5 IMPLANT
TIPS INNERVISION DETACH 40FR (MISCELLANEOUS)
TOWEL OR 17X26 10 PK STRL BLUE (TOWEL DISPOSABLE) ×5 IMPLANT
TOWEL OR NON WOVEN STRL DISP B (DISPOSABLE) IMPLANT
TRAY FOLEY MTR SLVR 16FR STAT (SET/KITS/TRAYS/PACK) ×5 IMPLANT
TRAY LAPAROSCOPIC (CUSTOM PROCEDURE TRAY) ×5 IMPLANT
TROCAR BLADELESS OPT 5 100 (ENDOMECHANICALS) ×5 IMPLANT
TROCAR XCEL BLUNT TIP 100MML (ENDOMECHANICALS) IMPLANT
TROCAR XCEL NON-BLD 11X100MML (ENDOMECHANICALS) ×5 IMPLANT

## 2019-07-11 NOTE — Anesthesia Procedure Notes (Signed)
Procedure Name: Intubation Date/Time: 07/11/2019 9:58 AM Performed by: Sharlette Dense, CRNA Patient Re-evaluated:Patient Re-evaluated prior to induction Oxygen Delivery Method: Circle system utilized Preoxygenation: Pre-oxygenation with 100% oxygen Induction Type: IV induction, Rapid sequence and Cricoid Pressure applied Laryngoscope Size: Miller and 2 Grade View: Grade I Tube type: Oral Tube size: 7.0 mm Number of attempts: 1 Airway Equipment and Method: Stylet Placement Confirmation: ETT inserted through vocal cords under direct vision,  positive ETCO2 and breath sounds checked- equal and bilateral Secured at: 20 cm Tube secured with: Tape Dental Injury: Teeth and Oropharynx as per pre-operative assessment

## 2019-07-11 NOTE — Op Note (Signed)
NAME: Toni Gutierrez, Toni Gutierrez ACCOUNT [id number] DATE OF BIRTH:[birth year] FACILITY: [facility] LOCATION: [facility] PHYSICIAN:Staley Budzinski Ronnie Derby, MD  OPERATIVE REPORT  DATE OF PROCEDURE:  07/11/2019  PREOPERATIVE DIAGNOSES:   1.  Large hiatal hernia.  2.  Gastroesophageal reflux disease.  POSTOPERATIVE DIAGNOSES:   1.  Large paraesophageal hiatal hernia.  2.  Gastroesophageal reflux disease.   PROCEDURES:   1.  Laparoscopic repair of paraesophageal hernia with Toupet fundoplication and gastropexy. 2.  Upper esophagogastroduodenoscopy.  SURGEON:  Greer Pickerel, MD  ASSISTANT:  Ralene Ok, MD  ANESTHESIA:  General.  ESTIMATED BLOOD LOSS:  Minimal.  ANESTHESIA:  General.  SPECIMENS:  Hernia sac, which was discarded.  INDICATIONS:  The patient is a pleasant 59 year old female who I have seen on several occasions regarding her epigastric and left upper quadrant pain.  This has been going on for several years.  She would have left-sided and left upper quadrant pain and  was on omeprazole for heartburn.  She described her pain as sensations of twisting or knot sensation in her upper abdomen after eating.  She had an upper endoscopy which confirmed a large hiatal hernia without any evidence of esophagitis.  An upper GI  revealed prominent reflux up to the level of the thoracic esophagus and a moderately large hiatal hernia.  There was no evidence of stricture other than a little bit of narrowing of the distal esophagus just above the hiatal hernia.  The CT scan was also  performed, which demonstrated a large hiatal hernia with about 80% of the stomach in the thorax.  She also had gallstones and she had some right-sided pain, but it was unclear if it was more musculoskeletal or actually symptomatic cholelithiasis.  It  seemed that she was more symptomatic from her large hiatal hernia, so therefore, the operative plan was repair of hiatal hernia with fundoplication and  gastropexy and depending on how complex or complicated that part of the procedure was, maybe consider  same day cholecystectomy.  DESCRIPTION OF PROCEDURE:  The patient received oral Tylenol and gabapentin preoperatively as part of the ERAS protocol.  She also received 5000 units of subcutaneous heparin.  Informed consent was reverified and she was taken to OR 1 at [place] endotracheal anesthesia was established.  Sequential compression devices were placed.  A Foley catheter was placed.  Her arms were tucked at her side with the appropriate padding.  A surgical timeout was performed.  She received IV  antibiotic prior to skin incision.  Access to her abdomen was obtained using the Optiview technique in the left upper quadrant.  A small incision was made just in the midclavicular line just below the left subcostal margin.  Then, using a 0-degree 5 mm laparoscope, I carefully advanced it  through all layers of the abdominal wall and entered the abdominal cavity.  Pneumoperitoneum was smoothly established without a change in the patient's vital signs.  There is no evidence of injury to the viscera.  She had some omental adhesions in the  lower midline.  She also had a little bit of omental adhesions in the left upper quadrant.  The patient was then placed in extreme reverse Trendelenburg.  A 5 mm trocar was placed just about 2 inches above and to the left of the umbilicus.  I then made a  small incision in the subxiphoid position in order to lift up the left lobe of the liver with a Nathanson liver retractor.  I  then placed a 5 mm trocar in the lateral right abdomen and an 11 mm trocar in the right midabdomen and then finally a 5 mm  trocar in the left lateral abdominal wall, all of these under direct visualization.  We then performed a bilateral laparoscopic TAP block consisting of a mixture of Exparel and Marcaine.  I took down the adhesions of the omentum to the left upper   quadrant anterior abdominal wall.  We then, with the aid of my assistant, we went about reducing the herniated stomach out of the mediastinum.  Approximately about 80% of her stomach was in her chest.    I decided to start mobilizing the hernia sac on the right side of the abdomen along the right crus.  The gastrohepatic ligament was incised with Harmonic scalpel.  I then got in the avascular plane between the hernia sac along the right edge of the crus.   Then, using a combination of blunt dissection with a Prestige grasper, along with laparoscopic Kitner, I was able to develop a plane and start reducing the hernia sac out of the mediastinum.  I continued this dissection and mobilization anteriorly.  At  times, I would use the Harmonic scalpel, trying to prevent thermal spread.  We came across anteriorly.  My assistant was aiding with retracting the hernia sac.  We came across to the patient's left side to the left crus.  I then decided to start taking  down the short gastrics along the greater curvature of the stomach along the midbody of the stomach.  My assistant retracted laterally and then I took down some of the short gastrics with the Harmonic scalpel.  I continued up toward the fundus.  We were  actually then able to visualize that the patient had another area of omentum that had herniated posteriorly and we reduced this out of the mediastinum.  I then was able to finish taking down the entire short gastrics all the way up to the left crus of  the diaphragm.  We continued mobilizing the sac on the left side and incised the peritoneum just along the left crura and connected where we had mobilized it from the patient's right side.  At this point, I felt that I could pass a grasper in the  retrogastric space.  I identified the junction of the left and right crura and I was able to pass a Prestige grasper retrogastric.  We then placed a Penrose drain and brought it back retrogastric and secured it to  itself, allowing my assistant to lift up  on the Penrose which was encircling the distal esophagus and upper stomach.  I then continued mediastinal dissection.  We were able to identify the anterior vagus and the posterior vagus.  I continued to take down the anterior attachments from the  esophagus to the mediastinum using a combination of blunt dissection with the laparoscopic Kitner and at times, using the Harmonic scalpel to transect avascular tissue, ensuring no thermal injury to the esophagus.  As we got further up in the  mediastinum, it became obvious that the anterior vagus was fused to the right parietal pleura.  A defect was made in the parietal pleura, which was not an unexpected occurrence.  Anesthesia had no issues ventilating the patient.  I was able to separate  it eventually.  We then took down the posterior attachments between the esophagus and the aorta.  At this point, my assistant scrubbed out and performed an upper endoscopy to  visualize the location of the Z-line.  There was a large hernia sac, which we  reduced.  We also identified an esophageal fat pad, which was teased away from the esophagus with a combination of blunt dissection with a Kitner.  I then scrubbed out to assist my partner in doing the upper endoscopy and he scrubbed back in.  I was able  to do the upper endoscopy and advanced over an endoscope down the oropharynx, into the esophagus.  We visualized the Z-line.  It was marked laparoscopically with a marking pen.  It was probably still about 0.5 cm above the diaphragm.  I scrubbed back in  and then we continued to try to do circumferential mobilization of the esophagus in the mediastinum.  I was able to achieve more length.  I did not feel that taking the anterior and posterior vagus would give me significant additional length.  After  additional dissection behind the heart, I had achieved an intraabdominal length of the esophagus.  The previously placed mark was below  the diaphragm.  Again, we talked about taking the vagus nerves, but did not feel that it would result in any  additional significant length.  I had the Z-line below the diaphragm.  At this time, I decided to go ahead and reapproximate the left and right crura.  I first debulked and took down some of the hernia sac and perigastric fat pad that we had reduced from  the esophagus.  With the Harmonic scalpel, we carefully ensured that.  I did not detach the entire hernia sac.  A small cuff was left around the stomach.  We reduced the pneumoperitoneum to 10 mmHg.  I then reapproximated the posterior crura easily with  0 Ethibond EndoStitch sutures, each secured with a titanium Ti-Knot.  I placed an anterior cruroplasty suture as well in a similar fashion.  At this point, I decided to do a partial posterior fundoplication (Toupet fundoplication).  Anesthesia placed a  56-French lighted tapered bougie into the oropharynx and gently guided it down and we visualized it coming down the esophagus into the stomach.  Using an atraumatic grasper, I passed it retrogastric and grabbed the fundus and brought it back around and  then grabbed the other side of the fundus on the left side of the abdomen. I was able to perform a shoeshine maneuver.  The posterior fundoplication was then performed.  I first placed sutures on the patient's right side.  A 0 Ethibond free needle was  then placed at the 10 o'clock position,  grabbing a seromuscular bite of the esophagus to the fundus that had been passed retrogastric to the patient's right side and then anchored to the right crus anteriorly at the 10 o'clock position and secured with  a titanium Ti-Knot.  I then performed 2 additional sutures using Ethibonds, taking a seromuscular bite of esophagus to the fundus on the patient's right side, each secured with a titanium Ti-Knot.  I then performed a similar suture fixation on the  patient's left side.  Again, a 0 Ethibond free needle  was placed through the fundus at the patient's left side to a seromuscular bite of the esophagus to the left crus of the diaphragm anteriorly.  Two additional sutures were placed on the left side  between the fundus and the esophagus, each secured with a titanium Ti-Knot.  The posterior wrap appeared intact.  It was not under undue tension.  I then performed a gastropexy of the posterior fundus  to the crura repair with a 0 Ethibond EndoStitch  secured with a titanium Ti-Knot.  An Eco Sac was then placed in the abdomen and the hernia sac x2 was placed in the Eco Sac and removed from the abdominal cavity.  A trocar was replaced.  Abdomen was inspected.  There was no evidence of injury to  surrounding structures.  Additional local was infiltrated.  I then removed the 11 mm trocar and closed the fascial defect with an interrupted 0 Vicryl using a PMI suture passer.  Pneumoperitoneum was released, trocars were removed after the [facility]  liver retractor had been removed.  Skin incisions were closed with a 4-0 Monocryl in a subcuticular fashion, followed by the application of Steri-Strips and bandages.  There were no immediate complications.  The patient tolerated the procedure well.  She  was taken to the recovery room in stable condition.  VN/NUANCE  D:07/11/2019 T:07/11/2019 JOB:[id number]

## 2019-07-11 NOTE — H&P (Signed)
Toni Gutierrez is an 59 y.o. female.   Chief Complaint: here for surgery HPI: 59 year old female presents for scheduled surgery for laparoscopic repair of a large hiatal hernia with fundoplication possible gastropexy possible upper endoscopy possible cholecystectomy.  She was originally scheduled for surgery a few weeks ago but on her preoperative testing her Covid test came back positive.  She was asymptomatic.  She denied any fever, shortness of breath, difficulty breathing, sick contacts, abdominal pain or diarrhea, change in taste or smell.  Her surgery was delayed to make sure she did not become symptomatic.  She denies any changes since I saw in the clinic.  She reports that she has been feeling well other than her continued abdominal issues.  She continues to have epigastric and more so left upper quadrant discomfort as previously described.  She denies any alcohol use  Last clinic hpi The patient is a 59 year old female who presents with non-malignant abdominal pain. She comes in today for additional conversation regarding her known large hiatal hernia and abdominal complaints. Initially met her back in 2019. I think this is about the fourth visit to discuss her abdominal concerns. This will be the second visit to again rediscuss hiatal hernia repair and possible cholecystectomy. She did not proceed with surgery last summer. Its unclear why. She is accompanied by family member today. She is ready to proceed with surgery she states. She states that she is about the same. She has more left-sided and left upper quadrant pain and right-sided abdominal pain. She is still not drinking. She is still on omeprazole for heartburn. She has not vomited. She is able to tolerate liquids and solids. Still only eats small portions. Still have occasional sensations of a twisting or knot sensation in her upper abdomen after eating. No tobacco use. No chest tightness, angina, jaw pain, TIAs or  amaurosis fugax. No blurry vision. No severe headaches. No dysuria or hematuria. No melena or hematochezia. Denies heartburn at night. She'll occasionally have some right-sided discomfort at times.  I reviewed my last office note from June of last year. I also rereviewed the CT scan from last June which showed a very large hiatal hernia with 80-90% of the stomach in the chest, cholelithiasis, no evidence of varices or portal hypertension. I also reviewed her Epic chart to see if there are any significant medical encounters  Past Medical History:  Diagnosis Date  . Alcohol abuse    No alcohol intake since 2001  . Allergic rhinitis   . Anemia   . Arthritis    "back, hands" (01/06/2018)  . Carpal tunnel syndrome, right   . Chronic lower back pain    MRI of cervical, lumbar spine (all obtained by Dr. Ronnie Derby in 2006)- showing mild DDD with bulges but without disc herniation, at multiple levels; mild facet joint dz, primarily at L3-4 and L4-5 bilaterally  . Heartburn   . Hyperlipidemia   . Hypertension   . Left knee pain    s/p ACL reconstruction by Dr. Ronnie Derby  . Shoulder pain, bilateral    MRI right shoulder showing tear of infraspinatus tendon with bursal surface fraying of the infraspinatus    Past Surgical History:  Procedure Laterality Date  . ANTERIOR CRUCIATE LIGAMENT REPAIR Left    By Dr. Ronnie Derby  . CARPAL TUNNEL RELEASE Right 02/08/2019   Procedure: CARPAL TUNNEL RELEASE;  Surgeon: Latanya Maudlin, MD;  Location: [facility];  Service: Orthopedics;  Laterality: Right;  . CESAREAN SECTION  1986  . SHOULDER ARTHROSCOPY W/ ROTATOR CUFF REPAIR Right   . TUBAL LIGATION  1989    Family History  Problem Relation Age of Onset  . Stroke Mother   . Colon cancer Neg Hx   . Esophageal cancer Neg Hx   . Rectal cancer Neg Hx   . Stomach cancer Neg Hx    Social History:  reports that she has never smoked. She has never used smokeless tobacco. She reports previous  alcohol use of about 24.0 standard drinks of alcohol per week. She reports previous drug use.  Allergies: No Known Allergies  Medications Prior to Admission  Medication Sig Dispense Refill  . atorvastatin (LIPITOR) 40 MG tablet Take 40 mg by mouth daily.    . bisacodyl (DULCOLAX) 5 MG EC tablet Take 5 mg by mouth in the morning and at bedtime.    . ferrous sulfate 325 (65 FE) MG tablet Take 325 mg by mouth daily with breakfast.     . HYDROcodone-acetaminophen (NORCO) 10-325 MG tablet Take 1 tablet by mouth every 6 (six) hours as needed for moderate pain.   0  . losartan (COZAAR) 50 MG tablet Take 50 mg by mouth daily.    . meloxicam (MOBIC) 15 MG tablet Take 15 mg by mouth daily.    . Multiple Vitamins-Minerals (MULTIVITAMIN WITH MINERALS) tablet Take 1 tablet by mouth daily.    Marland Kitchen omeprazole (PRILOSEC) 20 MG capsule Take 2 capsules (40 mg total) by mouth daily. (Patient taking differently: Take 20 mg by mouth daily. ) 60 capsule 0  . pregabalin (LYRICA) 75 MG capsule Take 75 mg by mouth 2 (two) times daily.    Marland Kitchen tiZANidine (ZANAFLEX) 2 MG tablet Take 2 mg by mouth every 6 (six) hours as needed for muscle spasms.      Results for orders placed or performed during the hospital encounter of 07/11/19 (from the past 48 hour(s))  Comprehensive metabolic panel     Status: Abnormal   Collection Time: 07/10/19 12:23 PM  Result Value Ref Range   Sodium 142 135 - 145 mmol/L   Potassium 3.5 3.5 - 5.1 mmol/L   Chloride 109 98 - 111 mmol/L   CO2 25 22 - 32 mmol/L   Glucose, Bld 87 70 - 99 mg/dL    Comment: Glucose reference range applies only to samples taken after fasting for at least 8 hours.   BUN 15 6 - 20 mg/dL   Creatinine, Ser 0.59 0.44 - 1.00 mg/dL   Calcium 8.8 (L) 8.9 - 10.3 mg/dL   Total Protein 6.9 6.5 - 8.1 g/dL   Albumin 4.0 3.5 - 5.0 g/dL   AST 22 15 - 41 U/L   ALT 20 0 - 44 U/L   Alkaline Phosphatase 72 38 - 126 U/L   Total Bilirubin 0.6 0.3 - 1.2 mg/dL   GFR calc non Af Amer  >60 >60 mL/min   GFR calc Af Amer >60 >60 mL/min   Anion gap 8 5 - 15    Comment: Performed at [facility], [place] [address], [place],  [address]  Type and screen All cardiac and thoracic surgeries, spinal fusions, myomectomies, craniotomies, colon & liver resections, total joint revisions, same day c-section with placenta previa or accreta     Status: None   Collection Time: 07/10/19 12:23 PM  Result Value Ref Range   ABO/RH(D) O POS    Antibody Screen NEG    Sample Expiration 07/14/2019,2359    Extend  sample reason      NO TRANSFUSIONS OR PREGNANCY IN THE PAST 3 MONTHS Performed at [facility] [address] [place], [place] [address]   CBC with Differential/Platelet     Status: None   Collection Time: 07/10/19 12:23 PM  Result Value Ref Range   WBC 4.9 4.0 - 10.5 K/uL   RBC 4.01 3.87 - 5.11 MIL/uL   Hemoglobin 12.6 12.0 - 15.0 g/dL   HCT 37.3 36.0 - 46.0 %   MCV 93.0 80.0 - 100.0 fL   MCH 31.4 26.0 - 34.0 pg   MCHC 33.8 30.0 - 36.0 g/dL   RDW 12.3 11.5 - 15.5 %   Platelets 269 150 - 400 K/uL   nRBC 0.0 0.0 - 0.2 %   Neutrophils Relative % 42 %   Neutro Abs 2.1 1.7 - 7.7 K/uL   Lymphocytes Relative 45 %   Lymphs Abs 2.2 0.7 - 4.0 K/uL   Monocytes Relative 9 %   Monocytes Absolute 0.4 0.1 - 1.0 K/uL   Eosinophils Relative 3 %   Eosinophils Absolute 0.2 0.0 - 0.5 K/uL   Basophils Relative 1 %   Basophils Absolute 0.0 0.0 - 0.1 K/uL   Immature Granulocytes 0 %   Abs Immature Granulocytes 0.01 0.00 - 0.07 K/uL    Comment: Performed at [facility], [place] [address] [place], [place] [address]   No results found.  Review of Systems  All other systems reviewed and are negative.   Blood pressure (!) 184/94, pulse 74, temperature 97.9 F (36.6 C), temperature source Oral, resp. rate 18, height _0  (1.499 m), weight 60.6 kg, last menstrual period 12/25/2010, SpO2 100 %. Physical Exam  Vitals  reviewed. Constitutional: She is oriented to person, place, and time. She appears well-developed and well-nourished. No distress.  HENT:  Head: Normocephalic and atraumatic.  Right Ear: External ear normal.  Left Ear: External ear normal.  Eyes: Conjunctivae are normal. No scleral icterus.  Neck: No tracheal deviation present. No thyromegaly present.  Cardiovascular: Normal rate and normal heart sounds.  Respiratory: Effort normal and breath sounds normal. No stridor. No respiratory distress. She has no wheezes.  GI: Soft. She exhibits no distension. There is no abdominal tenderness. There is no rebound.  Musculoskeletal:        General: No tenderness or edema.     Cervical back: Normal range of motion and neck supple.  Lymphadenopathy:    She has no cervical adenopathy.  Neurological: She is alert and oriented to person, place, and time. She exhibits normal muscle tone.  Skin: Skin is warm and dry. No rash noted. She is not diaphoretic. No erythema. No pallor.  Psychiatric: She has a normal mood and affect. Her behavior is normal. Judgment and thought content normal.     Assessment/Plan Large hiatal hernia Cholelithiasis Hypertension  Plan is today for laparoscopic repair of large hiatal hernia with fundoplication and possible gastropexy and possible upper endoscopy.  We discussed that there is a possibility that some of her upper abdominal symptoms specifically her occasional right-sided pain may be due to her gallstones or it could be musculoskeletal in nature.  We had previously discussed and discussed again this morning that the majority of her symptoms are left upper quadrant think that the more pressing primary goal today is to repair her hiatal hernia.  If that portion of the procedure goes well without significant technical difficulty then we may entertain concomitant cholecystectomy.  However we  did discuss that should the hiatal hernia repair and fundoplication portion be  technically challenging we would not proceed with same-day cholecystectomy.  She voiced understanding.  We have extensively discussed risk and benefits of the procedure on on multiple prior clinic visit  IV antibiotic ERS medication Preoperative subcu heparin  All questions asked and answered  Leighton Ruff. Redmond Pulling, MD, FACS General, Bariatric, & Minimally Invasive Surgery [facility], [place]   Greer Pickerel, MD 07/11/2019, 8:42 AM

## 2019-07-11 NOTE — Brief Op Note (Addendum)
07/11/2019  1:09 PM  PATIENT:  Toni Gutierrez  59 y.o. female  PRE-OPERATIVE DIAGNOSIS:  LARGE HIATAL HERNIA; GERD   POST-OPERATIVE DIAGNOSIS:  LARGE Paraesophageal hiatal HERNIA; GERD  PROCEDURE:  Procedure(s): LAPAROSCOPIC REPAIR OF PARAESOPHAGEAL HERNIA WITH TOUPET FUNDOPLICATION AND GASTROPEXY (N/A) UPPER ESOPHAGOGASTRODUODENOSCOPY (EGD) (N/A)  SURGEON:  Surgeon(s) and Role:    Greer Pickerel, MD - Primary    * Ralene Ok, MD - Assisting  PHYSICIAN ASSISTANT:   ASSISTANTS: see above   ANESTHESIA:   general  EBL:  minimal   BLOOD ADMINISTERED:none  DRAINS: none   LOCAL MEDICATIONS USED:  MARCAINE    and OTHER exparel  SPECIMEN:  Source of Specimen:  hernia sac  DISPOSITION OF SPECIMEN:  discarded  COUNTS:  YES  TOURNIQUET:  * No tourniquets in log *  DICTATION: Viviann Spare Dictation [id number]   PLAN OF CARE: Admit for overnight observation  PATIENT DISPOSITION:  PACU - hemodynamically stable.   Delay start of Pharmacological VTE agent (>24hrs) due to surgical blood loss or risk of bleeding: no  Leighton Ruff. Redmond Pulling, MD, FACS General, Bariatric, & Minimally Invasive Surgery [facility], [place]

## 2019-07-11 NOTE — Anesthesia Postprocedure Evaluation (Signed)
Anesthesia Post Note  Patient: Toni Gutierrez  Procedure(s) Performed: LAPAROSCOPIC REPAIR OF PARAESOPHAGEAL HERNIA WITH TOUPET FUNDOPLICATION AND GASTROPEXY (N/A Abdomen) UPPER ESOPHAGOGASTRODUODENOSCOPY (EGD) (N/A Esophagus)     Patient location during evaluation: PACU Anesthesia Type: General Level of consciousness: awake and alert Pain management: pain level controlled Vital Signs Assessment: post-procedure vital signs reviewed and stable Respiratory status: spontaneous breathing, nonlabored ventilation, respiratory function stable and patient connected to nasal cannula oxygen Cardiovascular status: blood pressure returned to baseline and stable Postop Assessment: no apparent nausea or vomiting Anesthetic complications: no    Last Vitals:  Vitals:   07/11/19 1413 07/11/19 1435  BP: (!) 181/92 (!) 164/109  Pulse: 70 77  Resp: 11 18  Temp:  36.5 C  SpO2: 100% 99%    Last Pain:  Vitals:   07/11/19 1435  TempSrc: Oral  PainSc:                  [name]

## 2019-07-11 NOTE — Anesthesia Preprocedure Evaluation (Signed)
Anesthesia Evaluation  Patient identified by MRN, date of birth, ID band Patient awake    Reviewed: Allergy & Precautions, NPO status , Patient's Chart, lab work & pertinent test results  Airway Mallampati: II  TM Distance: >3 FB Neck ROM: Full    Dental no notable dental hx.    Pulmonary neg pulmonary ROS,    Pulmonary exam normal breath sounds clear to auscultation       Cardiovascular hypertension, Normal cardiovascular exam Rhythm:Regular Rate:Normal     Neuro/Psych negative neurological ROS  negative psych ROS   GI/Hepatic negative GI ROS, Neg liver ROS,   Endo/Other  negative endocrine ROS  Renal/GU negative Renal ROS  negative genitourinary   Musculoskeletal negative musculoskeletal ROS (+)   Abdominal   Peds negative pediatric ROS (+)  Hematology negative hematology ROS (+)   Anesthesia Other Findings   Reproductive/Obstetrics negative OB ROS                             Anesthesia Physical Anesthesia Plan  ASA: II  Anesthesia Plan: General   Post-op Pain Management:    Induction: Intravenous and Rapid sequence  PONV Risk Score and Plan: 3 and Ondansetron, Dexamethasone, Midazolam and Treatment may vary due to age or medical condition  Airway Management Planned: Oral ETT  Additional Equipment:   Intra-op Plan:   Post-operative Plan: Extubation in OR  Informed Consent: I have reviewed the patients History and Physical, chart, labs and discussed the procedure including the risks, benefits and alternatives for the proposed anesthesia with the patient or authorized representative who has indicated his/her understanding and acceptance.     Dental advisory given  Plan Discussed with: CRNA and Surgeon  Anesthesia Plan Comments:         Anesthesia Quick Evaluation

## 2019-07-11 NOTE — Transfer of Care (Signed)
Immediate Anesthesia Transfer of Care Note  Patient: Vashtie Forst  Procedure(s) Performed: LAPAROSCOPIC REPAIR OF PARAESOPHAGEAL HERNIA WITH TOUPET FUNDOPLICATION AND GASTROPEXY (N/A Abdomen) UPPER ESOPHAGOGASTRODUODENOSCOPY (EGD) (N/A Esophagus)  Patient Location: PACU  Anesthesia Type:General  Level of Consciousness: drowsy  Airway & Oxygen Therapy: Patient Spontanous Breathing and Patient connected to face mask oxygen  Post-op Assessment: Report given to RN and Post -op Vital signs reviewed and stable  Post vital signs: Reviewed and stable  Last Vitals:  Vitals Value Taken Time  BP 189/103 07/11/19 1301  Temp 36.2 C 07/11/19 1300  Pulse 81 07/11/19 1303  Resp 15 07/11/19 1303  SpO2 100 % 07/11/19 1303  Vitals shown include unvalidated device data.  Last Pain:  Vitals:   07/11/19 1300  TempSrc:   PainSc: Asleep         Complications: No apparent anesthesia complications

## 2019-07-12 ENCOUNTER — Observation Stay (HOSPITAL_COMMUNITY): Payer: Medicare Other

## 2019-07-12 DIAGNOSIS — K449 Diaphragmatic hernia without obstruction or gangrene: Secondary | ICD-10-CM | POA: Diagnosis not present

## 2019-07-12 LAB — BASIC METABOLIC PANEL
Anion gap: 9 (ref 5–15)
BUN: 9 mg/dL (ref 6–20)
CO2: 25 mmol/L (ref 22–32)
Calcium: 9.3 mg/dL (ref 8.9–10.3)
Chloride: 107 mmol/L (ref 98–111)
Creatinine, Ser: 0.72 mg/dL (ref 0.44–1.00)
GFR calc Af Amer: >60 mL/min (ref >60)
GFR calc non Af Amer: >60 mL/min (ref >60)
Glucose, Bld: 136 mg/dL — ABNORMAL HIGH (ref 70–99)
Potassium: 4.3 mmol/L (ref 3.5–5.1)
Sodium: 141 mmol/L (ref 135–145)

## 2019-07-12 LAB — CBC
HCT: 36.6 % (ref 36.0–46.0)
Hemoglobin: 12.2 g/dL (ref 12.0–15.0)
MCH: 30.7 pg (ref 26.0–34.0)
MCHC: 33.3 g/dL (ref 30.0–36.0)
MCV: 92.2 fL (ref 80.0–100.0)
Platelets: 272 10*3/uL (ref 150–400)
RBC: 3.97 MIL/uL (ref 3.87–5.11)
RDW: 12.6 % (ref 11.5–15.5)
WBC: 12.5 10*3/uL — ABNORMAL HIGH (ref 4.0–10.5)
nRBC: 0 % (ref 0.0–0.2)

## 2019-07-12 MED ORDER — ENSURE ENLIVE PO LIQD
237.0000 mL | Freq: Two times a day (BID) | ORAL | Status: DC
  Administered 2019-07-12 (×2): 237 mL via ORAL

## 2019-07-12 MED ORDER — ACETAMINOPHEN 325 MG PO TABS: 650.0000 mg | ORAL_TABLET | Freq: Four times a day (QID) | ORAL | 0 refills | Status: AC | PRN

## 2019-07-12 MED ORDER — KETOROLAC TROMETHAMINE 15 MG/ML IJ SOLN
15.0000 mg | Freq: Three times a day (TID) | INTRAMUSCULAR | Status: DC
  Administered 2019-07-12: 15 mg via INTRAVENOUS
  Filled 2019-07-12: qty 1

## 2019-07-12 MED ORDER — IOHEXOL 300 MG/ML  SOLN
150.0000 mL | Freq: Once | INTRAMUSCULAR | Status: AC | PRN
  Administered 2019-07-12: 75 mL via ORAL

## 2019-07-12 NOTE — Discharge Instructions (Signed)
EATING AFTER YOUR ESOPHAGEAL SURGERY (Stomach Fundoplication, Hiatal Hernia repair, Achalasia surgery, etc)  ######################################################################  EAT Start with a pureed / full liquid diet (see below) Gradually transition to a high fiber diet with a fiber supplement over the next month after discharge.    WALK Walk an hour a day.  Control your pain to do that.    CONTROL PAIN Control pain so that you can walk, sleep, tolerate sneezing/coughing, go up/down stairs.  HAVE A BOWEL MOVEMENT DAILY Keep your bowels regular to avoid problems.  OK to try a laxative to override constipation.  OK to use an antidairrheal to slow down diarrhea.  Call if not better after 2 tries  CALL IF YOU HAVE PROBLEMS/CONCERNS Call if you are still struggling despite following these instructions. Call if you have concerns not answered by these instructions  ######################################################################   After your esophageal surgery, expect some sticking with swallowing over the next 1-2 months.    If food sticks when you eat, it is called "dysphagia".  This is due to swelling around your esophagus at the wrap & hiatal diaphragm repair.  It will gradually ease off over the next few months.  To help you through this temporary phase, we start you out on a pureed (blenderized) diet.  Your first meal in the hospital was thin liquids.  You should have been given a pureed diet by the time you left the hospital.  We ask patients to stay on a pureed diet for the first 2-3 weeks to avoid anything getting "stuck" near your recent surgery.  Don't be alarmed if your ability to swallow doesn't progress according to this plan.  Everyone is different and some diets can advance more or less quickly.    It is often helpful to crush your medications or split them as they can sometimes stick, especially the first week or so.   Some BASIC RULES to follow  are:  Maintain an upright position whenever eating or drinking.  Take small bites - just a teaspoon size bite at a time.  Eat slowly.  It may also help to eat only one food at a time.  Consider nibbling through smaller, more frequent meals & avoid the urge to eat BIG meals  Do not push through feelings of fullness, nausea, or bloatedness  Do not mix solid foods and liquids in the same mouthful  Try not to "wash foods down" with large gulps of liquids.  Avoid carbonated (bubbly/fizzy) drinks.    Avoid foods that make you feel gassy or bloated.  Start with bland foods first.  Wait on trying greasy, fried, or spicy meals until you are tolerating more bland solids well.  Understand that it will be hard to burp and belch at first.  This gradually improves with time.  Expect to be more gassy/flatulent/bloated initially.  Walking will help your body manage it better.  Consider using medications for bloating that contain simethicone such as  Maalox or Gas-X   Consider crushing her medications, especially smaller pills.  The ability to swallow pills should get easier after a few weeks  Eat in a relaxed atmosphere & minimize distractions.  Avoid talking while eating.    Do not use straws.  Following each meal, sit in an upright position (90 degree angle) for 60 to 90 minutes.  Going for a short walk can help as well  If food does stick, don't panic.  Try to relax and let the food pass on its own.    Sipping WARM LIQUID such as strong hot black tea can also help slide it down.   Be gradual in changes & use common sense:  -If you easily tolerating a certain "level" of foods, advance to the next level gradually -If you are having trouble swallowing a particular food, then avoid it.   -If food is sticking when you advance your diet, go back to thinner previous diet (the lower LEVEL) for 1-2 days.  LEVEL 1 = PUREED DIET  Do for the first 2 WEEKS AFTER SURGERY  -Foods in this group are  pureed or blenderized to a smooth, mashed potato-like consistency.  -If necessary, the pureed foods can keep their shape with the addition of a thickening agent.   -Meat should be pureed to a smooth, pasty consistency.  Hot broth or gravy may be added to the pureed meat, approximately 1 oz. of liquid per 3 oz. serving of meat. -CAUTION:  If any foods do not puree into a smooth consistency, swallowing will be more difficult.  (For example, nuts or seeds sometimes do not blend well.)  Hot Foods Cold Foods  Pureed scrambled eggs and cheese Pureed cottage cheese  Baby cereals Thickened juices and nectars  Thinned cooked cereals (no lumps) Thickened milk or eggnog  Pureed French toast or pancakes Ensure  Mashed potatoes Ice cream  Pureed parsley, au gratin, scalloped potatoes, candied sweet potatoes Fruit or Italian ice, sherbet  Pureed buttered or alfredo noodles Plain yogurt  Pureed vegetables (no corn or peas) Instant breakfast  Pureed soups and creamed soups Smooth pudding, mousse, custard  Pureed scalloped apples Whipped gelatin  Gravies Sugar, syrup, honey, jelly  Sauces, cheese, tomato, barbecue, white, creamed Cream  Any baby food Creamer  Alcohol in moderation (not beer or champagne) Margarine  Coffee or tea Mayonnaise   Ketchup, mustard   Apple sauce   SAMPLE MENU:  PUREED DIET Breakfast Lunch Dinner   Orange juice, 1/2 cup  Cream of wheat, 1/2 cup  Pineapple juice, 1/2 cup  Pureed turkey, barley soup, 3/4 cup  Pureed Hawaiian chicken, 3 oz   Scrambled eggs, mashed or blended with cheese, 1/2 cup  Tea or coffee, 1 cup   Whole milk, 1 cup   Non-dairy creamer, 2 Tbsp.  Mashed potatoes, 1/2 cup  Pureed cooled broccoli, 1/2 cup  Apple sauce, 1/2 cup  Coffee or tea  Mashed potatoes, 1/2 cup  Pureed spinach, 1/2 cup  Frozen yogurt, 1/2 cup  Tea or coffee      LEVEL 2 = SOFT DIET  After your first 2 weeks, you can advance to a soft diet.   Keep on this  diet until everything goes down easily.  Hot Foods Cold Foods  White fish Cottage cheese  Stuffed fish Junior baby fruit  Baby food meals Semi thickened juices  Minced soft cooked, scrambled, poached eggs nectars  Souffle & omelets Ripe mashed bananas  Cooked cereals Canned fruit, pineapple sauce, milk  potatoes Milkshake  Buttered or Alfredo noodles Custard  Cooked cooled vegetable Puddings, including tapioca  Sherbet Yogurt  Vegetable soup or alphabet soup Fruit ice, Italian ice  Gravies Whipped gelatin  Sugar, syrup, honey, jelly Junior baby desserts  Sauces:  Cheese, creamed, barbecue, tomato, white Cream  Coffee or tea Margarine   SAMPLE MENU:  LEVEL 2 Breakfast Lunch Dinner   Orange juice, 1/2 cup  Oatmeal, 1/2 cup  Scrambled eggs with cheese, 1/2 cup  Decaffeinated tea, 1 cup  Whole milk, 1 cup    Non-dairy creamer, 2 Tbsp  Pineapple juice, 1/2 cup  Minced beef, 3 oz  Gravy, 2 Tbsp  Mashed potatoes, 1/2 cup  Minced fresh broccoli, 1/2 cup  Applesauce, 1/2 cup  Coffee, 1 cup  Turkey, barley soup, 3/4 cup  Minced Hawaiian chicken, 3 oz  Mashed potatoes, 1/2 cup  Cooked spinach, 1/2 cup  Frozen yogurt, 1/2 cup  Non-dairy creamer, 2 Tbsp      LEVEL 3 = CHOPPED DIET  -After all the foods in level 2 (soft diet) are passing through well you should advance up to more chopped foods.  -It is still important to cut these foods into small pieces and eat slowly.  Hot Foods Cold Foods  Poultry Cottage cheese  Chopped Swedish meatballs Yogurt  Meat salads (ground or flaked meat) Milk  Flaked fish (tuna) Milkshakes  Poached or scrambled eggs Soft, cold, dry cereal  Souffles and omelets Fruit juices or nectars  Cooked cereals Chopped canned fruit  Chopped French toast or pancakes Canned fruit cocktail  Noodles or pasta (no rice) Pudding, mousse, custard  Cooked vegetables (no frozen peas, corn, or mixed vegetables) Green salad  Canned small sweet peas  Ice cream  Creamed soup or vegetable soup Fruit ice, Italian ice  Pureed vegetable soup or alphabet soup Non-dairy creamer  Ground scalloped apples Margarine  Gravies Mayonnaise  Sauces:  Cheese, creamed, barbecue, tomato, white Ketchup  Coffee or tea Mustard   SAMPLE MENU:  LEVEL 3 Breakfast Lunch Dinner   Orange juice, 1/2 cup  Oatmeal, 1/2 cup  Scrambled eggs with cheese, 1/2 cup  Decaffeinated tea, 1 cup  Whole milk, 1 cup  Non-dairy creamer, 2 Tbsp  Ketchup, 1 Tbsp  Margarine, 1 tsp  Salt, 1/4 tsp  Sugar, 2 tsp  Pineapple juice, 1/2 cup  Ground beef, 3 oz  Gravy, 2 Tbsp  Mashed potatoes, 1/2 cup  Cooked spinach, 1/2 cup  Applesauce, 1/2 cup  Decaffeinated coffee  Whole milk  Non-dairy creamer, 2 Tbsp  Margarine, 1 tsp  Salt, 1/4 tsp  Pureed turkey, barley soup, 3/4 cup  Barbecue chicken, 3 oz  Mashed potatoes, 1/2 cup  Ground fresh broccoli, 1/2 cup  Frozen yogurt, 1/2 cup  Decaffeinated tea, 1 cup  Non-dairy creamer, 2 Tbsp  Margarine, 1 tsp  Salt, 1/4 tsp  Sugar, 1 tsp    LEVEL 4:  REGULAR FOODS  -Foods in this group are soft, moist, regularly textured foods.   -This level includes meat and breads, which tend to be the hardest things to swallow.   -Eat very slowly, chew well and continue to avoid carbonated drinks. -most people are at this level in 4-6 weeks  Hot Foods Cold Foods  Baked fish or skinned Soft cheeses - cottage cheese  Souffles and omelets Cream cheese  Eggs Yogurt  Stuffed shells Milk  Spaghetti with meat sauce Milkshakes  Cooked cereal Cold dry cereals (no nuts, dried fruit, coconut)  French toast or pancakes Crackers  Buttered toast Fruit juices or nectars  Noodles or pasta (no rice) Canned fruit  Potatoes (all types) Ripe bananas  Soft, cooked vegetables (no corn, lima, or baked beans) Peeled, ripe, fresh fruit  Creamed soups or vegetable soup Cakes (no nuts, dried fruit, coconut)  Canned chicken  noodle soup Plain doughnuts  Gravies Ice cream  Bacon dressing Pudding, mousse, custard  Sauces:  Cheese, creamed, barbecue, tomato, white Fruit ice, Italian ice, sherbet  Decaffeinated tea or coffee Whipped gelatin  Pork chops Regular gelatin     Canned fruited gelatin molds   Sugar, syrup, honey, jam, jelly   Cream   Non-dairy   Margarine   Oil   Mayonnaise   Ketchup   Mustard   TROUBLESHOOTING IRREGULAR BOWELS  1) Avoid extremes of bowel movements (no bad constipation/diarrhea)  2) Miralax 17gm mixed in 8oz. water or juice-daily. May use BID as needed.  3) Gas-x,Phazyme, etc. as needed for gas & bloating.  4) Soft,bland diet. No spicy,greasy,fried foods.  5) Prilosec over-the-counter as needed  6) May hold gluten/wheat products from diet to see if symptoms improve.  7) May try probiotics (Align, Activa, etc) to help calm the bowels down  7) If symptoms become worse call back immediately.    If you have any questions please call our office at [facility]: [phone].  

## 2019-07-12 NOTE — Progress Notes (Signed)
Discharge instructions were given to patient.  All questions were answered.  Patient was taken to the main entrance in a wheelchair.

## 2019-07-12 NOTE — Progress Notes (Signed)
Nutrition Education Note  RD consulted for nutrition education. Per MD, pt to follow full liquid diet for 2 days then progress to soft diet for 1-2 weeks then progress to regular diet.   RD reviewed full liquid options with patient.Pprovided "Low Fiber Nutrition Therapy" handout from the Academy of [employer]. Reviewed patient's dietary recall. Provided examples on ways to decrease fiber intake in the diet such as avoiding whole grains, seeds, nuts, raw vegetables,  fruits with skin, and connective tissue of meats. Recommended protein shakes while on liquid diet and restricted diet. Advised patient to avoid artificial sweeteners, acidic/spicy foods, and caffeine. Discussed with patient the importance of having adequate amounts of lean protein and avoiding saturated fats. Gave patient examples of meals and menu planning.   Teach back method used.  Expect fair compliance.  Body mass index is 26.96 kg/m.   Current diet order is full liquids. Labs and medications reviewed. No further nutrition interventions warranted at this time.  If additional nutrition issues arise, please re-consult RD.  Clayton Bibles, MS, RD, LDN Inpatient Clinical Dietitian Contact information available via Amion

## 2019-07-12 NOTE — Discharge Summary (Signed)
Physician Discharge Summary  Toni Gutierrez [id number] DOB: January 19, 1961 DOA: 07/11/2019  PCP: [name]., FNP  Admit date: 07/11/2019 Discharge date: 07/12/2019  Recommendations for Outpatient Follow-up:    Follow-up Information    Greer Pickerel, MD. Go on 08/16/2019.   Specialty: General Surgery Why: 9:30 AM; pls arrive at 9:15 am for postop check Contact information: [address] [place] [address] [phone]          Discharge Diagnoses:  1. Large paraesophageal hiatal hernia/GERD 2. Hypertension 3. Chronic back pain  Surgical Procedure: laparoscopic repair of paraesophageal hernia with toupee fundoplication and gastropexy, upper endoscopy  Discharge Condition: good Disposition: home  Diet recommendation: full liquid diet to soft diet  Filed Weights   07/11/19 0732  Weight: 60.6 kg    History of present illness:    Hospital Course:  The patient presented for elective repair of known paraesophageal hiatal hernia.  Please see operative note for additional details.  She was maintained on perioperative chemical DVT prophylaxis.  She is kept for overnight observation.  She was tolerating liquids.  On postop day 1 she had no fever or tachycardia.  She did have some hypertension issues.  She is tolerating liquids.  She had minimal to no nausea and no vomiting.  She underwent an upper GI which showed repair of the paraesophageal hernia and expected fundoplication with no leak or significant delay in passage of contrast through the wrap.  She received dietary counseling from the dietitian about postoperative diet.  I also verbally discussed discharge with the patient as well  BP (!) 182/83   Pulse 66   Temp 98.7 F (37.1 C) (Oral)   Resp 18   Ht 4\' 11"  (1.499 m)   Wt 60.6 kg   LMP 12/25/2010   SpO2 96%   BMI 26.96 kg/m   Gen: alert, NAD, non-toxic appearing Pupils: equal, no scleral icterus Pulm: Lungs clear to auscultation,  symmetric chest rise CV: regular rate and rhythm Abd: soft, mild tender, nondistended.  No cellulitis. No incisional hernia Ext: no edema, no calf tenderness Skin: no rash, no jaundice    Discharge Instructions  Discharge Instructions    Call MD for:   Complete by: As directed    Temperature >101   Call MD for:  hives   Complete by: As directed    Call MD for:  persistant dizziness or light-headedness   Complete by: As directed    Call MD for:  persistant nausea and vomiting   Complete by: As directed    Call MD for:  redness, tenderness, or signs of infection (pain, swelling, redness, odor or green/yellow discharge around incision site)   Complete by: As directed    Call MD for:  severe uncontrolled pain   Complete by: As directed    Diet full liquid   Complete by: As directed    Discharge instructions   Complete by: As directed    See [facility] discharge instructions   Increase activity slowly   Complete by: As directed      Allergies as of 07/12/2019   No Known Allergies     Medication List    TAKE these medications   acetaminophen 325 MG tablet Commonly known as: Tylenol Take 2 tablets (650 mg total) by mouth every 6 (six) hours as needed for up to 5 days for mild pain. No more than 4000mg  of tylenol from all sources   atorvastatin 40 MG tablet Commonly known  as: LIPITOR Take 40 mg by mouth daily.   bisacodyl 5 MG EC tablet Commonly known as: DULCOLAX Take 5 mg by mouth in the morning and at bedtime.   ferrous sulfate 325 (65 FE) MG tablet Take 325 mg by mouth daily with breakfast.   HYDROcodone-acetaminophen 10-325 MG tablet Commonly known as: NORCO Take 1 tablet by mouth every 6 (six) hours as needed for moderate pain.   losartan 50 MG tablet Commonly known as: COZAAR Take 50 mg by mouth daily.   meloxicam 15 MG tablet Commonly known as: MOBIC Take 15 mg by mouth daily.   multivitamin with minerals tablet Take 1 tablet by mouth daily.   omeprazole  20 MG capsule Commonly known as: PRILOSEC Take 2 capsules (40 mg total) by mouth daily. What changed: how much to take   pregabalin 75 MG capsule Commonly known as: LYRICA Take 75 mg by mouth 2 (two) times daily.   tiZANidine 2 MG tablet Commonly known as: ZANAFLEX Take 2 mg by mouth every 6 (six) hours as needed for muscle spasms.      Follow-up Information    Greer Pickerel, MD. Go on 08/16/2019.   Specialty: General Surgery Why: 9:30 AM; pls arrive at 9:15 am for postop check Contact information: [address] [place] [address] [phone]            The results of significant diagnostics from this hospitalization (including imaging, microbiology, ancillary and laboratory) are listed below for reference.    Significant Diagnostic Studies: DG CHEST PORT 1 VIEW  Result Date: 07/11/2019 CLINICAL DATA:  Status post repair of paraesophageal hernia today. EXAM: PORTABLE CHEST 1 VIEW COMPARISON:  Most recent radiograph 01/06/2018. FINDINGS: The previous retrocardiac hernia is no longer seen, there is ill-defined retrocardiac opacity may represent combination of atelectasis and small volume of pleural fluid. Upper normal heart size, unchanged. Streaky opacities at the right lung base consistent with atelectasis. No definite pneumothorax. No pulmonary edema. Chronic change of the shoulders. IMPRESSION: 1. The previous retrocardiac hernia is no longer seen, there is ill-defined retrocardiac opacity may represent combination of atelectasis and small volume of pleural fluid. 2. Streaky right basilar atelectasis. Electronically Signed   By: Keith Rake M.D.   On: 07/11/2019 15:44   DG UGI W SINGLE CM (SOL OR THIN BA)  Result Date: 07/12/2019 CLINICAL DATA:  Provided history: Postop hiatal hernia repair. Additional history obtained from [place] repair of paraesophageal hiatal hernia with Toupet fundoplication and gastropexy. EXAM: UPPER GI  SERIES WITH KUB TECHNIQUE: After obtaining a scout radiograph a routine upper GI series was performed using water-soluble iodinated contrast. FLUOROSCOPY TIME:  Fluoroscopy Time:  1 minutes. Radiation Exposure Index (if provided by the fluoroscopic device): 29.3 mGy Number of Acquired Spot Images: 3 COMPARISON:  CT abdomen/pelvis 03/06/2018. FINDINGS: A scout radiograph of the abdomen demonstrates a nonobstructive bowel gas pattern. There is mild gaseous distention of the stomach. Mild colonic stool burden within the right colon. Prominent lumbar levocurvature with lumbar spondylosis. There is smooth narrowing of the distal esophagus consistent with the history of fundoplication. The esophagus is otherwise normal in caliber and smooth in contour. Normal esophageal motility was observed. No evidence of residual hiatal hernia. No gastroesophageal reflux was observed. No extraluminal contrast is identified to suggest leak. There is free flow of contrast from the stomach into the duodenal bulb and duodenal sweep. IMPRESSION: Water-soluble upper GI series performed status post paraesophageal hiatal hernia repair and fundoplication.  No residual hiatal hernia. No extraluminal contrast is identified to suggest leak. There is free flow of contrast from the distal esophagus into the stomach. No gastroesophageal reflux. Electronically Signed   By: Kellie Simmering DO   On: 07/12/2019 09:25   MM DIAG BREAST TOMO UNI LEFT  Result Date: 07/05/2019 CLINICAL DATA:  Screening recall for a possible left breast distortion. EXAM: DIGITAL DIAGNOSTIC UNILATERAL LEFT MAMMOGRAM WITH CAD AND TOMO COMPARISON:  Previous exam(s). ACR Breast Density Category b: There are scattered areas of fibroglandular density. FINDINGS: Spot compression tomosynthesis images through the upper outer quadrant of the left breast demonstrates normal fibroglandular tissue. No persistent findings of distortion are identified. Mammographic images were processed with  CAD. IMPRESSION: Resolution of the questioned distortion in the upper-outer quadrant of the left breast. RECOMMENDATION: Screening mammogram in one year.(Code:SM-B-01Y) I have discussed the findings and recommendations with the patient. If applicable, a reminder letter will be sent to the patient regarding the next appointment. BI-RADS CATEGORY  1: Negative. Electronically Signed   By: Ammie Ferrier M.D.   On: 07/05/2019 16:08    Microbiology: No results found for this or any previous visit (from the past 240 hour(s)).   Labs: Basic Metabolic Panel: Recent Labs  Lab 07/10/19 1223 07/12/19 0409  NA 142 141  K 3.5 4.3  CL 109 107  CO2 25 25  GLUCOSE 87 136*  BUN 15 9  CREATININE 0.59 0.72  CALCIUM 8.8* 9.3   Liver Function Tests: Recent Labs  Lab 07/10/19 1223  AST 22  ALT 20  ALKPHOS 72  BILITOT 0.6  PROT 6.9  ALBUMIN 4.0   No results for input(s): LIPASE, AMYLASE in the last 168 hours. No results for input(s): AMMONIA in the last 168 hours. CBC: Recent Labs  Lab 07/10/19 1223 07/12/19 0409  WBC 4.9 12.5*  NEUTROABS 2.1  --   HGB 12.6 12.2  HCT 37.3 36.6  MCV 93.0 92.2  PLT 269 272   Cardiac Enzymes: No results for input(s): CKTOTAL, CKMB, CKMBINDEX, TROPONINI in the last 168 hours. BNP: BNP (last 3 results) No results for input(s): BNP in the last 8760 hours.  ProBNP (last 3 results) No results for input(s): PROBNP in the last 8760 hours.  CBG: Recent Labs  Lab 07/11/19 1735  GLUCAP 191*    Active Problems:   S/P repair of paraesophageal hernia   Time coordinating discharge: 15 min  Signed:  Gayland Curry, MD [facility], [place] [phone] 07/12/2019, 1:01 PM

## 2019-08-21 ENCOUNTER — Other Ambulatory Visit: Payer: Self-pay | Admitting: Pain Medicine

## 2019-08-21 DIAGNOSIS — M545 Low back pain, unspecified: Secondary | ICD-10-CM

## 2019-08-21 DIAGNOSIS — M79652 Pain in left thigh: Secondary | ICD-10-CM

## 2019-08-21 DIAGNOSIS — M25552 Pain in left hip: Secondary | ICD-10-CM

## 2019-09-07 ENCOUNTER — Inpatient Hospital Stay: Admission: RE | Admit: 2019-09-07 | Payer: Medicare Other | Source: Ambulatory Visit

## 2020-04-12 ENCOUNTER — Other Ambulatory Visit: Payer: Self-pay | Admitting: Pain Medicine

## 2020-04-12 DIAGNOSIS — M79606 Pain in leg, unspecified: Secondary | ICD-10-CM

## 2020-04-12 DIAGNOSIS — G8929 Other chronic pain: Secondary | ICD-10-CM

## 2020-04-12 DIAGNOSIS — M545 Low back pain, unspecified: Secondary | ICD-10-CM

## 2020-04-28 ENCOUNTER — Ambulatory Visit
Admission: RE | Admit: 2020-04-28 | Discharge: 2020-04-28 | Disposition: A | Discharge status: Home / Self Care | Payer: Medicare Other | Source: Ambulatory Visit | Attending: Pain Medicine | Admitting: Pain Medicine

## 2020-04-28 ENCOUNTER — Other Ambulatory Visit: Payer: Self-pay

## 2020-04-28 DIAGNOSIS — M545 Low back pain, unspecified: Secondary | ICD-10-CM

## 2020-04-28 DIAGNOSIS — M79606 Pain in leg, unspecified: Secondary | ICD-10-CM

## 2020-04-28 DIAGNOSIS — G8929 Other chronic pain: Secondary | ICD-10-CM

## 2020-05-02 ENCOUNTER — Other Ambulatory Visit: Payer: Self-pay

## 2020-05-02 ENCOUNTER — Ambulatory Visit
Admission: RE | Admit: 2020-05-02 | Discharge: 2020-05-02 | Disposition: A | Discharge status: Home / Self Care | Payer: Medicare Other | Source: Ambulatory Visit | Attending: Pain Medicine | Admitting: Pain Medicine

## 2020-05-02 ENCOUNTER — Other Ambulatory Visit: Payer: Self-pay | Admitting: Pain Medicine

## 2020-05-02 DIAGNOSIS — M25512 Pain in left shoulder: Secondary | ICD-10-CM

## 2020-06-20 ENCOUNTER — Other Ambulatory Visit: Payer: Self-pay

## 2020-06-20 ENCOUNTER — Encounter (HOSPITAL_COMMUNITY): Payer: Self-pay | Admitting: General Surgery

## 2020-06-20 ENCOUNTER — Ambulatory Visit: Payer: Self-pay | Admitting: General Surgery

## 2020-06-20 NOTE — Progress Notes (Signed)
COVID Vaccine Completed: Yes Date COVID Vaccine completed: x2 Has received booster: unsure COVID vaccine manufacturer: Pfizer     Date of COVID positive in last 90 days: No  PCP - [name]., FNP Cardiologist - N/A  Chest x-ray - 07/11/2019 in epic EKG - greater than 1 year in epic Stress Test - N/A ECHO - greater than 2 years in epic Cardiac Cath - N/A Pacemaker/ICD device last checked: N/A  Sleep Study - N/A CPAP - N/A  Fasting Blood Sugar - N/A Checks Blood Sugar __N/A___ times a day  Blood Thinner Instructions: N/A Aspirin Instructions: N/A Last Dose: N/A  Activity level:  Can go up a flight of stairs and activities of daily living without stopping and without symptoms       Anesthesia review: N/A  Patient denies shortness of breath, fever, cough and chest pain at PAT appointment   Patient verbalized understanding of instructions that were given to them at the PAT appointment. Patient was also instructed that they will need to review over the PAT instructions again at home before surgery.

## 2020-06-20 NOTE — Progress Notes (Signed)
Sent message, via epic in basket, requesting orders in epic from surgeon.  

## 2020-06-20 NOTE — Patient Instructions (Signed)
DUE TO COVID-19 ONLY ONE VISITOR IS ALLOWED TO COME WITH YOU AND STAY IN THE WAITING ROOM ONLY DURING PRE OP AND PROCEDURE.   **NO VISITORS ARE ALLOWED IN THE SHORT STAY AREA OR RECOVERY ROOM!!**   COVID SWAB TESTING  COMPLETED ON:  Today, June 21, 2020  (Must self quarantine after testing. Follow instructions on handout.)       Your procedure is scheduled on: Tuesday, Jun 25, 2020   Report to [facility] Main  Entrance    Report to admitting at 12:45 PM   Call this number if you have problems the morning of surgery [phone]   Do not eat food :After Midnight.   May have liquids until 11:45 AM   day of surgery  CLEAR LIQUID DIET  Foods Allowed                                                                     Foods Excluded  Water, Black Coffee and tea, regular and decaf                             liquids that you cannot  Plain Jell-O in any flavor  (No red)                                           see through such as: Fruit ices (not with fruit pulp)                                     milk, soups, orange juice              Iced Popsicles (No red)                                    All solid food                                   Apple juices Sports drinks like Gatorade (No red) Lightly seasoned clear broth or consume(fat free) Sugar, honey syrup  Sample Menu Breakfast                                Lunch                                     Supper Cranberry juice                    Beef broth                            Chicken broth Jell-O  Grape juice                           Apple juice Coffee or tea                        Jell-O                                      Popsicle                                                Coffee or tea                        Coffee or tea      Complete one Ensure drink the morning of surgery at       the day of surgery.   Drink 2 Ensure drinks the night before surgery.  Complete one Ensure  drink the morning of surgery 3 hours prior to scheduled surgery.     1. The day of surgery:  ? Drink ONE (1) Pre-Surgery Clear Ensure or G2 by am the morning of surgery. Drink in one sitting. Do not sip.  ? This drink was given to you during your hospital  pre-op appointment visit. ? Nothing else to drink after completing the  Pre-Surgery Clear Ensure or G2.          If you have questions, please contact your surgeon's office.     Oral Hygiene is also important to reduce your risk of infection.                                    Remember - BRUSH YOUR TEETH THE MORNING OF SURGERY WITH YOUR REGULAR TOOTHPASTE   Do NOT smoke after Midnight   Take these medicines the morning of surgery with A SIP OF WATER: Omeprazole, Pregablin                               You may not have any metal on your body including hair pins, jewelry, and body piercings             Do not wear make-up, lotions, powders, perfumes/cologne, or deodorant             Do not wear nail polish.  Do not shave  48 hours prior to surgery.             Do not bring valuables to the hospital. [place].   Contacts, dentures or bridgework may not be worn into surgery.    Patients discharged the day of surgery will not be allowed to drive home.   Special Instructions: Bring a copy of your healthcare power of attorney and living will documents         the day of surgery if you haven't scanned them in before.              Please read over  the following fact sheets you were given: IF YOU HAVE QUESTIONS ABOUT YOUR PRE OP INSTRUCTIONS PLEASE CALL [phone]   [place] - Preparing for Surgery Before surgery, you can play an important role.  Because skin is not sterile, your skin needs to be as free of germs as possible.  You can reduce the number of germs on your skin by washing with CHG (chlorahexidine gluconate) soap before surgery.  CHG is an antiseptic cleaner which kills  germs and bonds with the skin to continue killing germs even after washing. Please DO NOT use if you have an allergy to CHG or antibacterial soaps.  If your skin becomes reddened/irritated stop using the CHG and inform your nurse when you arrive at Short Stay. Do not shave (including legs and underarms) for at least 48 hours prior to the first CHG shower.  You may shave your face/neck.  Please follow these instructions carefully:  1.  Shower with CHG Soap the night before surgery and the  morning of surgery.  2.  If you choose to wash your hair, wash your hair first as usual with your normal  shampoo.  3.  After you shampoo, rinse your hair and body thoroughly to remove the shampoo.                             4.  Use CHG as you would any other liquid soap.  You can apply chg directly to the skin and wash.  Gently with a scrungie or clean washcloth.  5.  Apply the CHG Soap to your body ONLY FROM THE NECK DOWN.   Do   not use on face/ open                           Wound or open sores. Avoid contact with eyes, ears mouth and   genitals (private parts).                       Wash face,  Genitals (private parts) with your normal soap.             6.  Wash thoroughly, paying special attention to the area where your    surgery  will be performed.  7.  Thoroughly rinse your body with warm water from the neck down.  8.  DO NOT shower/wash with your normal soap after using and rinsing off the CHG Soap.                9.  Pat yourself dry with a clean towel.            10.  Wear clean pajamas.            11.  Place clean sheets on your bed the night of your first shower and do not  sleep with pets. Day of Surgery : Do not apply any lotions/deodorants the morning of surgery.  Please wear clean clothes to the hospital/surgery center.  FAILURE TO FOLLOW THESE INSTRUCTIONS MAY RESULT IN THE CANCELLATION OF YOUR SURGERY  PATIENT SIGNATURE_________________________________  NURSE  SIGNATURE__________________________________  ________________________________________________________________________

## 2020-06-21 ENCOUNTER — Encounter (HOSPITAL_COMMUNITY)
Admission: RE | Admit: 2020-06-21 | Discharge: 2020-06-21 | Disposition: A | Discharge status: Home / Self Care | Payer: Medicare Other | Source: Ambulatory Visit | Attending: General Surgery | Admitting: General Surgery

## 2020-06-21 ENCOUNTER — Other Ambulatory Visit (HOSPITAL_COMMUNITY)
Admission: RE | Admit: 2020-06-21 | Discharge: 2020-06-21 | Disposition: A | Discharge status: Home / Self Care | Payer: Medicare Other | Source: Ambulatory Visit | Attending: General Surgery | Admitting: General Surgery

## 2020-06-21 ENCOUNTER — Other Ambulatory Visit (HOSPITAL_COMMUNITY): Payer: Medicare Other

## 2020-06-21 DIAGNOSIS — Z01818 Encounter for other preprocedural examination: Secondary | ICD-10-CM | POA: Diagnosis not present

## 2020-06-21 DIAGNOSIS — Z20822 Contact with and (suspected) exposure to covid-19: Secondary | ICD-10-CM | POA: Diagnosis not present

## 2020-06-21 LAB — COMPREHENSIVE METABOLIC PANEL
ALT: 24 U/L (ref 0–44)
AST: 24 U/L (ref 15–41)
Albumin: 3.9 g/dL (ref 3.5–5.0)
Alkaline Phosphatase: 77 U/L (ref 38–126)
Anion gap: 9 (ref 5–15)
BUN: 13 mg/dL (ref 6–20)
CO2: 22 mmol/L (ref 22–32)
Calcium: 9 mg/dL (ref 8.9–10.3)
Chloride: 112 mmol/L — ABNORMAL HIGH (ref 98–111)
Creatinine, Ser: 0.78 mg/dL (ref 0.44–1.00)
GFR, Estimated: >60 mL/min (ref >60)
Glucose, Bld: 98 mg/dL (ref 70–99)
Potassium: 3.8 mmol/L (ref 3.5–5.1)
Sodium: 143 mmol/L (ref 135–145)
Total Bilirubin: 0.3 mg/dL (ref 0.3–1.2)
Total Protein: 6.8 g/dL (ref 6.5–8.1)

## 2020-06-21 LAB — CBC WITH DIFFERENTIAL/PLATELET
Abs Immature Granulocytes: 0.02 10*3/uL (ref 0.00–0.07)
Basophils Absolute: 0.1 10*3/uL (ref 0.0–0.1)
Basophils Relative: 1 %
Eosinophils Absolute: 0.3 10*3/uL (ref 0.0–0.5)
Eosinophils Relative: 5 %
HCT: 40.5 % (ref 36.0–46.0)
Hemoglobin: 13.7 g/dL (ref 12.0–15.0)
Immature Granulocytes: 0 %
Lymphocytes Relative: 33 %
Lymphs Abs: 2 10*3/uL (ref 0.7–4.0)
MCH: 31.6 pg (ref 26.0–34.0)
MCHC: 33.8 g/dL (ref 30.0–36.0)
MCV: 93.5 fL (ref 80.0–100.0)
Monocytes Absolute: 0.4 10*3/uL (ref 0.1–1.0)
Monocytes Relative: 7 %
Neutro Abs: 3.3 10*3/uL (ref 1.7–7.7)
Neutrophils Relative %: 54 %
Platelets: 225 10*3/uL (ref 150–400)
RBC: 4.33 MIL/uL (ref 3.87–5.11)
RDW: 13.4 % (ref 11.5–15.5)
WBC: 6.1 10*3/uL (ref 4.0–10.5)
nRBC: 0 % (ref 0.0–0.2)

## 2020-06-21 LAB — SARS CORONAVIRUS 2 (TAT 6-24 HRS): SARS Coronavirus 2: NEGATIVE

## 2020-06-21 NOTE — Patient Instructions (Signed)
DUE TO COVID-19 ONLY ONE VISITOR IS ALLOWED TO COME WITH YOU AND STAY IN THE WAITING ROOM ONLY DURING PRE OP AND PROCEDURE.   **NO VISITORS ARE ALLOWED IN THE SHORT STAY AREA OR RECOVERY ROOM!!**              COVID SWAB TESTING  COMPLETED ON:  Today, June 21, 2020  (Must self quarantine after testing. Follow instructions on handout.)                  Your procedure is scheduled on: Tuesday, Jun 25, 2020              Report to [facility] Main  Entrance              Report to admitting at 12:45 PM              Call this number if you have problems the morning of surgery [phone]              Do not eat food :After Midnight.              May have liquids until 11:45 AM   day of surgery  CLEAR LIQUID DIET  Foods Allowed                                                                     Foods Excluded  Water, Black Coffee and tea, regular and decaf                             liquids that you cannot  Plain Jell-O in any flavor  (No red)                                           see through such as: Fruit ices (not with fruit pulp)                                     milk, soups, orange juice              Iced Popsicles (No red)                                           All solid food                                   Apple juices Sports drinks like Gatorade (No red) Lightly seasoned clear broth or consume(fat free) Sugar, honey syrup  Sample Menu Breakfast                                Lunch  Supper Cranberry juice                    Beef broth                            Chicken broth Jell-O                                     Grape juice                           Apple juice Coffee or tea                        Jell-O                                      Popsicle                                                Coffee or tea                        Coffee or tea                                     Drink 2 Ensure drinks the  night before surgery.  Complete one Ensure drink the morning of surgery 3 hours prior to scheduled surgery.          Oral Hygieneis also important to reduce your risk of infection.                                   Remember - BRUSH YOUR TEETH THE MORNING OF SURGERY WITH YOUR REGULAR TOOTHPASTE              Do NOT smoke after Midnight              Take these medicines the morning of surgery with A SIP OF WATER: Omeprazole, Pregablin                               You may not have any metal on your body including hair pins, jewelry, and body piercings             Do not wear make-up, lotions, powders, perfumes/cologne, or deodorant             Do not wear nail polish.  Do not shave  48 hours prior to surgery.                        Do not bring valuables to the hospital. [place].              Contacts, dentures or bridgework may not be worn into surgery.  Patients discharged the day of surgery will not be allowed to drive home.              Special Instructions: Bring a copy of your healthcare power of attorney and living will documents         the day of surgery if you haven't scanned them in before.              Please read over the following fact sheets you were given: IF YOU HAVE QUESTIONS ABOUT YOUR PRE OP INSTRUCTIONS PLEASE CALL [phone]   [facility] - Preparing for Surgery Before surgery, you can play an important role.  Because skin is not sterile, your skin needs to be as free of germs as possible.  You can reduce the number of germs on your skin by washing with CHG (chlorahexidine gluconate) soap before surgery.  CHG is an antiseptic cleaner which kills germs and bonds with the skin to continue killing germs even after washing. Please DO NOT use if you have an allergy to CHG or antibacterial soaps.  If your skin becomes reddened/irritated stop using the CHG and inform your nurse when you  arrive at Short Stay. Do not shave (including legs and underarms) for at least 48 hours prior to the first CHG shower.  You may shave your face/neck.  Please follow these instructions carefully:             1.  Shower with CHG Soap the night before surgery and the  morning of surgery.             2.  If you choose to wash your hair, wash your hair first as usual with your normal  shampoo.             3.  After you shampoo, rinse your hair and body thoroughly to remove the shampoo.                                                 4.  Use CHG as you would any other liquid soap.  You can apply chg directly to the skin and wash.  Gently with a scrungie or clean washcloth.             5.  Apply the CHG Soap to your body ONLY FROM THE NECK DOWN.   Do                not use on face/ open                           Wound or open sores. Avoid contact with eyes, ears mouth and                        genitals (private parts).                       Wash face,  Genitals (private parts) with your normal soap.             6.  Wash thoroughly, paying special attention to the area where your  surgery  will be performed.             7.  Thoroughly rinse your body with warm water from the neck down.             8.  DO NOT shower/wash with your normal soap after using and rinsing off the CHG Soap.                9.  Pat yourself dry with a clean towel.            10.  Wear clean pajamas.            11.  Place clean sheets on your bed the night of your first shower and do not  sleep with pets. Day of Surgery : Do not apply any lotions/deodorants the morning of surgery.  Please wear clean clothes to the hospital/surgery center.  FAILURE TO FOLLOW THESE INSTRUCTIONS MAY RESULT IN THE CANCELLATION OF YOUR SURGERY  PATIENT SIGNATURE_________________________________  NURSE  SIGNATURE__________________________________  ________________________________________________________________________

## 2020-06-25 ENCOUNTER — Ambulatory Visit (HOSPITAL_COMMUNITY): Payer: Medicare Other | Admitting: Certified Registered"

## 2020-06-25 ENCOUNTER — Encounter (HOSPITAL_COMMUNITY): Payer: Self-pay | Admitting: General Surgery

## 2020-06-25 ENCOUNTER — Ambulatory Visit (HOSPITAL_COMMUNITY)
Admission: RE | Admit: 2020-06-25 | Discharge: 2020-06-25 | Disposition: A | Discharge status: Home / Self Care | Payer: Medicare Other | Attending: General Surgery | Admitting: General Surgery

## 2020-06-25 ENCOUNTER — Encounter (HOSPITAL_COMMUNITY)
Admission: RE | Disposition: A | Discharge status: Home / Self Care | Payer: Self-pay | Source: Home / Self Care | Attending: General Surgery

## 2020-06-25 ENCOUNTER — Ambulatory Visit (HOSPITAL_COMMUNITY): Payer: Medicare Other

## 2020-06-25 DIAGNOSIS — I10 Essential (primary) hypertension: Secondary | ICD-10-CM | POA: Diagnosis not present

## 2020-06-25 DIAGNOSIS — K219 Gastro-esophageal reflux disease without esophagitis: Secondary | ICD-10-CM | POA: Insufficient documentation

## 2020-06-25 DIAGNOSIS — Z823 Family history of stroke: Secondary | ICD-10-CM | POA: Insufficient documentation

## 2020-06-25 DIAGNOSIS — K8010 Calculus of gallbladder with chronic cholecystitis without obstruction: Secondary | ICD-10-CM | POA: Diagnosis present

## 2020-06-25 DIAGNOSIS — K828 Other specified diseases of gallbladder: Secondary | ICD-10-CM | POA: Insufficient documentation

## 2020-06-25 DIAGNOSIS — Z8616 Personal history of COVID-19: Secondary | ICD-10-CM | POA: Diagnosis not present

## 2020-06-25 DIAGNOSIS — Z419 Encounter for procedure for purposes other than remedying health state, unspecified: Secondary | ICD-10-CM

## 2020-06-25 HISTORY — PX: CHOLECYSTECTOMY: SHX55

## 2020-06-25 HISTORY — DX: Gastro-esophageal reflux disease without esophagitis: K21.9

## 2020-06-25 HISTORY — DX: Personal history of other diseases of the digestive system: Z87.19

## 2020-06-25 LAB — COMPREHENSIVE METABOLIC PANEL
ALT: 28 U/L (ref 0–44)
AST: 37 U/L (ref 15–41)
Albumin: 3.5 g/dL (ref 3.5–5.0)
Alkaline Phosphatase: 66 U/L (ref 38–126)
Anion gap: 9 (ref 5–15)
BUN: 10 mg/dL (ref 6–20)
CO2: 22 mmol/L (ref 22–32)
Calcium: 8.5 mg/dL — ABNORMAL LOW (ref 8.9–10.3)
Chloride: 109 mmol/L (ref 98–111)
Creatinine, Ser: 0.8 mg/dL (ref 0.44–1.00)
GFR, Estimated: >60 mL/min (ref >60)
Glucose, Bld: 101 mg/dL — ABNORMAL HIGH (ref 70–99)
Potassium: 3.4 mmol/L — ABNORMAL LOW (ref 3.5–5.1)
Sodium: 140 mmol/L (ref 135–145)
Total Bilirubin: 0.2 mg/dL — ABNORMAL LOW (ref 0.3–1.2)
Total Protein: 6.4 g/dL — ABNORMAL LOW (ref 6.5–8.1)

## 2020-06-25 LAB — CBC
HCT: 39.3 % (ref 36.0–46.0)
Hemoglobin: 13.3 g/dL (ref 12.0–15.0)
MCH: 32.1 pg (ref 26.0–34.0)
MCHC: 33.8 g/dL (ref 30.0–36.0)
MCV: 94.9 fL (ref 80.0–100.0)
Platelets: 210 10*3/uL (ref 150–400)
RBC: 4.14 MIL/uL (ref 3.87–5.11)
RDW: 13.7 % (ref 11.5–15.5)
WBC: 9.7 10*3/uL (ref 4.0–10.5)
nRBC: 0 % (ref 0.0–0.2)

## 2020-06-25 SURGERY — LAPAROSCOPIC CHOLECYSTECTOMY WITH INTRAOPERATIVE CHOLANGIOGRAM
Anesthesia: General

## 2020-06-25 MED ORDER — HYDROMORPHONE HCL 1 MG/ML IJ SOLN
0.2500 mg | INTRAMUSCULAR | Status: DC | PRN
Start: 2020-06-25 — End: 2020-06-25

## 2020-06-25 MED ORDER — ORAL CARE MOUTH RINSE: 15.0000 mL | Freq: Once | OROMUCOSAL | Status: AC

## 2020-06-25 MED ORDER — OXYCODONE HCL 5 MG PO TABS
5.0000 mg | ORAL_TABLET | Freq: Once | ORAL | Status: AC | PRN
  Administered 2020-06-25: 5 mg via ORAL

## 2020-06-25 MED ORDER — HYDROMORPHONE HCL 1 MG/ML IJ SOLN
0.2500 mg | INTRAMUSCULAR | Status: DC | PRN
  Administered 2020-06-25 (×4): 0.5 mg via INTRAVENOUS

## 2020-06-25 MED ORDER — ACETAMINOPHEN 500 MG PO TABS
1000.0000 mg | ORAL_TABLET | ORAL | Status: AC
  Administered 2020-06-25: 1000 mg via ORAL
  Filled 2020-06-25: qty 2

## 2020-06-25 MED ORDER — HYDROMORPHONE HCL 1 MG/ML IJ SOLN
INTRAMUSCULAR | Status: AC
  Filled 2020-06-25: qty 1

## 2020-06-25 MED ORDER — PROPOFOL 10 MG/ML IV BOLUS
INTRAVENOUS | Status: AC
  Filled 2020-06-25: qty 20

## 2020-06-25 MED ORDER — PROMETHAZINE HCL 25 MG/ML IJ SOLN
INTRAMUSCULAR | Status: AC
  Filled 2020-06-25: qty 1

## 2020-06-25 MED ORDER — OXYCODONE HCL 5 MG PO TABS
ORAL_TABLET | ORAL | Status: AC
  Filled 2020-06-25: qty 1

## 2020-06-25 MED ORDER — ONDANSETRON HCL 4 MG/2ML IJ SOLN
INTRAMUSCULAR | Status: AC
  Filled 2020-06-25: qty 2

## 2020-06-25 MED ORDER — MIDAZOLAM HCL 5 MG/5ML IJ SOLN
INTRAMUSCULAR | Status: DC | PRN
  Administered 2020-06-25: 2 mg via INTRAVENOUS

## 2020-06-25 MED ORDER — HYDRALAZINE HCL 20 MG/ML IJ SOLN
INTRAMUSCULAR | Status: AC
  Filled 2020-06-25: qty 1

## 2020-06-25 MED ORDER — OXYCODONE HCL 5 MG/5ML PO SOLN: 5.0000 mg | Freq: Once | ORAL | Status: AC | PRN

## 2020-06-25 MED ORDER — DEXAMETHASONE SODIUM PHOSPHATE 10 MG/ML IJ SOLN
INTRAMUSCULAR | Status: AC
  Filled 2020-06-25: qty 1

## 2020-06-25 MED ORDER — SODIUM CHLORIDE 0.9 % IV SOLN
2.0000 g | INTRAVENOUS | Status: AC
  Administered 2020-06-25: 2 g via INTRAVENOUS
  Filled 2020-06-25: qty 2

## 2020-06-25 MED ORDER — LABETALOL HCL 5 MG/ML IV SOLN
INTRAVENOUS | Status: AC
  Filled 2020-06-25: qty 4

## 2020-06-25 MED ORDER — LIDOCAINE 2% (20 MG/ML) 5 ML SYRINGE
INTRAMUSCULAR | Status: DC | PRN
  Administered 2020-06-25: 60 mg via INTRAVENOUS

## 2020-06-25 MED ORDER — FENTANYL CITRATE (PF) 100 MCG/2ML IJ SOLN
INTRAMUSCULAR | Status: DC | PRN
  Administered 2020-06-25 (×2): 50 ug via INTRAVENOUS

## 2020-06-25 MED ORDER — KETAMINE HCL 10 MG/ML IJ SOLN
INTRAMUSCULAR | Status: DC | PRN
  Administered 2020-06-25: 30 mg via INTRAVENOUS

## 2020-06-25 MED ORDER — CHLORHEXIDINE GLUCONATE 0.12 % MT SOLN
15.0000 mL | Freq: Once | OROMUCOSAL | Status: AC
  Administered 2020-06-25: 15 mL via OROMUCOSAL

## 2020-06-25 MED ORDER — LIDOCAINE 20MG/ML (2%) 15 ML SYRINGE OPTIME
INTRAMUSCULAR | Status: DC | PRN
  Administered 2020-06-25: 1.5 mg/kg/h via INTRAVENOUS

## 2020-06-25 MED ORDER — ROCURONIUM BROMIDE 10 MG/ML (PF) SYRINGE
PREFILLED_SYRINGE | INTRAVENOUS | Status: DC | PRN
  Administered 2020-06-25: 60 mg via INTRAVENOUS

## 2020-06-25 MED ORDER — FENTANYL CITRATE (PF) 100 MCG/2ML IJ SOLN
INTRAMUSCULAR | Status: AC
  Filled 2020-06-25: qty 2

## 2020-06-25 MED ORDER — CHLORHEXIDINE GLUCONATE CLOTH 2 % EX PADS: 6.0000 | MEDICATED_PAD | Freq: Once | CUTANEOUS | Status: DC

## 2020-06-25 MED ORDER — LIDOCAINE 2% (20 MG/ML) 5 ML SYRINGE
INTRAMUSCULAR | Status: AC
  Filled 2020-06-25: qty 5

## 2020-06-25 MED ORDER — MEPERIDINE HCL 50 MG/ML IJ SOLN
6.2500 mg | INTRAMUSCULAR | Status: DC | PRN
  Administered 2020-06-25: 12.5 mg via INTRAVENOUS

## 2020-06-25 MED ORDER — SODIUM CHLORIDE 0.9 % IV SOLN
INTRAVENOUS | Status: DC | PRN
  Administered 2020-06-25: 7 mL

## 2020-06-25 MED ORDER — ONDANSETRON HCL 4 MG/2ML IJ SOLN
INTRAMUSCULAR | Status: DC | PRN
  Administered 2020-06-25: 4 mg via INTRAVENOUS

## 2020-06-25 MED ORDER — LACTATED RINGERS IV SOLN: INTRAVENOUS | Status: DC

## 2020-06-25 MED ORDER — PROMETHAZINE HCL 25 MG/ML IJ SOLN
6.2500 mg | INTRAMUSCULAR | Status: DC | PRN
  Administered 2020-06-25: 12.5 mg via INTRAVENOUS

## 2020-06-25 MED ORDER — AMISULPRIDE (ANTIEMETIC) 5 MG/2ML IV SOLN: 10.0000 mg | Freq: Once | INTRAVENOUS | Status: DC | PRN

## 2020-06-25 MED ORDER — HYDRALAZINE HCL 20 MG/ML IJ SOLN
10.0000 mg | Freq: Four times a day (QID) | INTRAMUSCULAR | Status: DC | PRN
  Administered 2020-06-25: 10 mg via INTRAVENOUS

## 2020-06-25 MED ORDER — BUPIVACAINE-EPINEPHRINE (PF) 0.25% -1:200000 IJ SOLN
INTRAMUSCULAR | Status: AC
  Filled 2020-06-25: qty 30

## 2020-06-25 MED ORDER — PROPOFOL 10 MG/ML IV BOLUS
INTRAVENOUS | Status: DC | PRN
  Administered 2020-06-25: 140 mg via INTRAVENOUS

## 2020-06-25 MED ORDER — LACTATED RINGERS IV SOLN
INTRAVENOUS | Status: DC | PRN
  Administered 2020-06-25: 1000 mL

## 2020-06-25 MED ORDER — MEPERIDINE HCL 50 MG/ML IJ SOLN
INTRAMUSCULAR | Status: AC
  Filled 2020-06-25: qty 1

## 2020-06-25 MED ORDER — SUGAMMADEX SODIUM 200 MG/2ML IV SOLN
INTRAVENOUS | Status: DC | PRN
  Administered 2020-06-25: 150 mg via INTRAVENOUS

## 2020-06-25 MED ORDER — ROCURONIUM BROMIDE 10 MG/ML (PF) SYRINGE
PREFILLED_SYRINGE | INTRAVENOUS | Status: AC
  Filled 2020-06-25: qty 10

## 2020-06-25 MED ORDER — MIDAZOLAM HCL 2 MG/2ML IJ SOLN
INTRAMUSCULAR | Status: AC
  Filled 2020-06-25: qty 2

## 2020-06-25 MED ORDER — LABETALOL HCL 5 MG/ML IV SOLN
10.0000 mg | INTRAVENOUS | Status: AC | PRN
  Administered 2020-06-25 (×2): 10 mg via INTRAVENOUS

## 2020-06-25 MED ORDER — KETOROLAC TROMETHAMINE 30 MG/ML IJ SOLN
INTRAMUSCULAR | Status: AC
  Filled 2020-06-25: qty 1

## 2020-06-25 MED ORDER — ENSURE PRE-SURGERY PO LIQD: 296.0000 mL | Freq: Once | ORAL | Status: DC

## 2020-06-25 MED ORDER — DEXAMETHASONE SODIUM PHOSPHATE 10 MG/ML IJ SOLN
INTRAMUSCULAR | Status: DC | PRN
  Administered 2020-06-25: 10 mg via INTRAVENOUS

## 2020-06-25 MED ORDER — KETOROLAC TROMETHAMINE 30 MG/ML IJ SOLN
INTRAMUSCULAR | Status: DC | PRN
  Administered 2020-06-25: 30 mg via INTRAVENOUS

## 2020-06-25 SURGICAL SUPPLY — 51 items
APPLICATOR ARISTA FLEXITIP XL (MISCELLANEOUS) IMPLANT
APPLIER CLIP 5 13 M/L LIGAMAX5 (MISCELLANEOUS)
APPLIER CLIP ROT 10 11.4 M/L (STAPLE)
BENZOIN TINCTURE PRP APPL 2/3 (GAUZE/BANDAGES/DRESSINGS) IMPLANT
BNDG ADH 1X3 SHEER STRL LF (GAUZE/BANDAGES/DRESSINGS) ×8 IMPLANT
CABLE HIGH FREQUENCY MONO STRZ (ELECTRODE) ×2 IMPLANT
CHLORAPREP W/TINT 26 (MISCELLANEOUS) ×2 IMPLANT
CLIP APPLIE 5 13 M/L LIGAMAX5 (MISCELLANEOUS) IMPLANT
CLIP APPLIE ROT 10 11.4 M/L (STAPLE) IMPLANT
CLIP VESOLOCK MED LG 6/CT (CLIP) IMPLANT
CLSR STERI-STRIP ANTIMIC 1/2X4 (GAUZE/BANDAGES/DRESSINGS) ×2 IMPLANT
COVER MAYO STAND STRL (DRAPES) IMPLANT
COVER SURGICAL LIGHT HANDLE (MISCELLANEOUS) ×2 IMPLANT
COVER WAND RF STERILE (DRAPES) IMPLANT
DECANTER SPIKE VIAL GLASS SM (MISCELLANEOUS) ×2 IMPLANT
DERMABOND ADVANCED (GAUZE/BANDAGES/DRESSINGS) ×1
DERMABOND ADVANCED .7 DNX12 (GAUZE/BANDAGES/DRESSINGS) ×1 IMPLANT
DRAPE C-ARM 42X120 X-RAY (DRAPES) IMPLANT
DRSG TEGADERM 2-3/8X2-3/4 SM (GAUZE/BANDAGES/DRESSINGS) ×2 IMPLANT
ELECT REM PT RETURN 15FT ADLT (MISCELLANEOUS) ×2 IMPLANT
GAUZE SPONGE 2X2 8PLY STRL LF (GAUZE/BANDAGES/DRESSINGS) IMPLANT
GLOVE BIOGEL PI ORTHO PRO 7.5 (GLOVE) ×1
GLOVE PI ORTHO PRO STRL 7.5 (GLOVE) ×1 IMPLANT
GOWN STRL REUS W/TWL XL LVL3 (GOWN DISPOSABLE) ×6 IMPLANT
GRASPER SUT TROCAR 14GX15 (MISCELLANEOUS) IMPLANT
HEMOSTAT ARISTA ABSORB 3G PWDR (HEMOSTASIS) IMPLANT
HEMOSTAT SNOW SURGICEL 2X4 (HEMOSTASIS) IMPLANT
KIT BASIN OR (CUSTOM PROCEDURE TRAY) ×2 IMPLANT
KIT TURNOVER KIT A (KITS) ×2 IMPLANT
L-HOOK LAP DISP 36CM (ELECTROSURGICAL)
LHOOK LAP DISP 36CM (ELECTROSURGICAL) IMPLANT
POUCH RETRIEVAL ECOSAC 10 (ENDOMECHANICALS) ×1 IMPLANT
POUCH RETRIEVAL ECOSAC 10MM (ENDOMECHANICALS) ×2
SCISSORS LAP 5X35 DISP (ENDOMECHANICALS) ×2 IMPLANT
SET CHOLANGIOGRAPH MIX (MISCELLANEOUS) ×2 IMPLANT
SET IRRIG TUBING LAPAROSCOPIC (IRRIGATION / IRRIGATOR) ×2 IMPLANT
SET TUBE SMOKE EVAC HIGH FLOW (TUBING) ×2 IMPLANT
SLEEVE XCEL OPT CAN 5 100 (ENDOMECHANICALS) ×4 IMPLANT
SPONGE GAUZE 2X2 8PLY STRL LF (GAUZE/BANDAGES/DRESSINGS) ×2 IMPLANT
SPONGE GAUZE 2X2 STER 10/PKG (GAUZE/BANDAGES/DRESSINGS)
STRIP CLOSURE SKIN 1/2X4 (GAUZE/BANDAGES/DRESSINGS) IMPLANT
SUT MNCRL AB 4-0 PS2 18 (SUTURE) ×2 IMPLANT
SUT VIC AB 0 UR5 27 (SUTURE) IMPLANT
SUT VICRYL 0 TIES 12 18 (SUTURE) IMPLANT
SUT VICRYL 0 UR6 27IN ABS (SUTURE) IMPLANT
TOWEL OR 17X26 10 PK STRL BLUE (TOWEL DISPOSABLE) ×2 IMPLANT
TOWEL OR NON WOVEN STRL DISP B (DISPOSABLE) ×2 IMPLANT
TRAY LAPAROSCOPIC (CUSTOM PROCEDURE TRAY) ×2 IMPLANT
TROCAR BLADELESS OPT 5 100 (ENDOMECHANICALS) ×2 IMPLANT
TROCAR XCEL BLUNT TIP 100MML (ENDOMECHANICALS) IMPLANT
TROCAR XCEL NON-BLD 11X100MML (ENDOMECHANICALS) IMPLANT

## 2020-06-25 NOTE — Op Note (Signed)
Toni Gutierrez [id number] [birth year] 06/25/2020  Laparoscopic Cholecystectomy with IOC Procedure Note  Indications: This patient presents with symptomatic gallbladder disease and will undergo laparoscopic cholecystectomy.  Pre-operative Diagnosis: symptomatic cholelithiasis  Post-operative Diagnosis: same  Surgeon: Greer Pickerel MD FACS  Assistants: none  Anesthesia: General endotracheal anesthesia   Procedure Details  The patient was seen again in the Holding Room. The risks, benefits, complications, treatment options, and expected outcomes were discussed with the patient. The possibilities of reaction to medication, pulmonary aspiration, perforation of viscus, bleeding, recurrent infection, finding a normal gallbladder, the need for additional procedures, failure to diagnose a condition, the possible need to convert to an open procedure, and creating a complication requiring transfusion or operation were discussed with the patient. The likelihood of improving the patient's symptoms with return to their baseline status is good.  The patient and/or family concurred with the proposed plan, giving informed consent. The site of surgery properly noted. The patient was taken to Operating Room, identified as Toni Gutierrez and the procedure verified as Laparoscopic Cholecystectomy with Intraoperative Cholangiogram. A Time Out was held and the above information confirmed. Antibiotic prophylaxis was administered.   Prior to the induction of general anesthesia, antibiotic prophylaxis was administered. General endotracheal anesthesia was then administered and tolerated well. After the induction, the abdomen was prepped with Chloraprep and draped in the sterile fashion. The patient was positioned in the supine position.  Local anesthetic agent was injected into the skin near the umbilicus and an incision made. We dissected down to the abdominal fascia with blunt dissection.  The fascia was  incised vertically and we entered the peritoneal cavity bluntly.  A pursestring suture of 0-Vicryl was placed around the fascial opening.  The Hasson cannula was inserted and secured with the stay suture.  Pneumoperitoneum was then created with CO2 and tolerated well without any adverse changes in the patient's vital signs. An 5-mm port was placed in the subxiphoid position.  Two 5-mm ports were placed in the right upper quadrant. All skin incisions were infiltrated with a local anesthetic agent before making the incision and placing the trocars.   We positioned the patient in reverse Trendelenburg, tilted slightly to the patient's left.  The gallbladder was identified, the fundus grasped and retracted cephalad. Adhesions were lysed bluntly and with the electrocautery where indicated, taking care not to injure any adjacent organs or viscus. The infundibulum was grasped and retracted laterally, exposing the peritoneum overlying the triangle of Calot. This was then divided and exposed in a blunt fashion. A critical view of the cystic duct and cystic artery was obtained.  The cystic duct was clearly identified and bluntly dissected circumferentially. The cystic duct was ligated with a clip distally.   An incision was made in the cystic duct and the [facility] cholangiogram catheter introduced. The catheter was secured using a clip. A cholangiogram was then obtained which showed good visualization of the distal and proximal biliary tree with no sign of filling defects or obstruction.  Contrast flowed easily into the duodenum. The catheter was then removed.   The cystic duct was then ligated with clips and divided. The cystic artery which had been identified & dissected free was ligated with clips and divided as well.   The gallbladder was dissected from the liver bed in retrograde fashion with the electrocautery.  There was some spillage of bile from the gallbladder.  I did come across a small posterior cystic artery  branch in the midportion of mobilizing the  gallbladder off of the liver.  There was clearly going into the gallbladder.  I placed 2 clips on it and transected it distally with hook electrocautery.  The gallbladder was removed and placed in an Ecco sac.  The gallbladder and Ecco sac were then removed through the umbilical port site. The liver bed was irrigated and inspected. Hemostasis was achieved with the electrocautery. Copious irrigation was utilized and was repeatedly aspirated until clear.  The pursestring suture was used to close the umbilical fascia.    We again inspected the right upper quadrant for hemostasis.  The umbilical closure was inspected and there was no air leak and nothing trapped within the closure. Pneumoperitoneum was released as we removed the trocars.  4-0 Monocryl was used to close the skin.    steri-strips, and clean dressings were applied. The patient was then extubated and brought to the recovery room in stable condition. Instrument, sponge, and needle counts were correct at closure and at the conclusion of the case.   Findings: Probable Chronic Cholecystitis with Cholelithiasis nml ioc  Estimated Blood Loss: Minimal         Drains: none         Specimens: Gallbladder           Complications: None; patient tolerated the procedure well.         Disposition: PACU - hemodynamically stable.         Condition: stable  Leighton Ruff. Redmond Pulling, MD, FACS General, Bariatric, & Minimally Invasive Surgery [facility], [place]

## 2020-06-25 NOTE — Discharge Instructions (Signed)
[facility] LAPAROSCOPIC SURGERY: POST OP INSTRUCTIONS Always review your discharge instruction sheet given to you by the facility where your surgery was performed. IF YOU HAVE DISABILITY OR FAMILY LEAVE FORMS, YOU MUST BRING THEM TO THE OFFICE FOR PROCESSING.   DO NOT GIVE THEM TO YOUR DOCTOR.  PAIN CONTROL  1. First take acetaminophen (Tylenol) AND/or ibuprofen (Advil) to control your pain after surgery.  Follow directions on package.  Taking acetaminophen (Tylenol) and/or ibuprofen (Advil) regularly after surgery will help to control your pain and lower the amount of prescription pain medication you may need.  You should not take more than 3,000 mg (3 grams) of acetaminophen (Tylenol) in 24 hours.  You should not take ibuprofen (Advil), aleve, motrin, naprosyn or other NSAIDS if you have a history of stomach ulcers or chronic kidney disease.  2. A prescription for pain medication may be given to you upon discharge.  Take your pain medication as prescribed, if you still have uncontrolled pain after taking acetaminophen (Tylenol) or ibuprofen (Advil). 3. Use ice packs to help control pain. 4. If you need a refill on your pain medication, please contact your pharmacy.  They will contact our office to request authorization. Prescriptions will not be filled after 5pm or on week-ends.  HOME MEDICATIONS 5. Take your usually prescribed medications unless otherwise directed.  DIET 6. You should follow a light diet the first few days after arrival home.  Be sure to include lots of fluids daily. Avoid fatty, fried foods.   CONSTIPATION 7. It is common to experience some constipation after surgery and if you are taking pain medication.  Increasing fluid intake and taking a stool softener (such as Colace) will usually help or prevent this problem from occurring.  A mild laxative (Milk of Magnesia or Miralax) should be taken according to package instructions if there are no bowel  movements after 48 hours.  WOUND/INCISION CARE 8. Most patients will experience some swelling and bruising in the area of the incisions.  Ice packs will help.  Swelling and bruising can take several days to resolve.  9. Unless discharge instructions indicate otherwise, follow guidelines below  a. STERI-STRIPS - you may remove your outer bandages 48 hours after surgery, and you may shower at that time.  You have steri-strips (small skin tapes) in place directly over the incision.  These strips should be left on the skin for 7-10 days.   b. DERMABOND/SKIN GLUE - you may shower in 24 hours.  The glue will flake off over the next 2-3 weeks. 10. Any sutures or staples will be removed at the office during your follow-up visit.  ACTIVITIES 11. You may resume regular (light) daily activities beginning the next day--such as daily self-care, walking, climbing stairs--gradually increasing activities as tolerated.  You may have sexual intercourse when it is comfortable.  Refrain from any heavy lifting or straining until approved by your doctor. a. You may drive when you are no longer taking prescription pain medication, you can comfortably wear a seatbelt, and you can safely maneuver your car and apply brakes.  FOLLOW-UP 12. You should see your doctor in the office for a follow-up appointment approximately 2-3 weeks after your surgery.  You should have been given your post-op/follow-up appointment when your surgery was scheduled.  If you did not receive a post-op/follow-up appointment, make sure that you call for this appointment within a day or two after you arrive home to insure a convenient appointment time.  OTHER  INSTRUCTIONS 13.   WHEN TO CALL YOUR DOCTOR: 1. Fever over 101.0 2. Inability to urinate 3. Continued bleeding from incision. 4. Increased pain, redness, or drainage from the incision. 5. Increasing abdominal pain  The clinic staff is available to answer your questions during regular  business hours.  Please don't hesitate to call and ask to speak to one of the nurses for clinical concerns.  If you have a medical emergency, go to the nearest emergency room or call 911.  A surgeon from [facility] is always on call at the hospital. [address] [place], [place], [place]  [address] ? [address], [place], [place]   [address] [phone] ? [phone] ? FAX [phone] Web site: [email or web address]  ........Marland Kitchen   Managing Your Pain After Surgery Without Opioids    Thank you for participating in our program to help patients manage their pain after surgery without opioids. This is part of our effort to provide you with the best care possible, without exposing you or your family to the risk that opioids pose.  What pain can I expect after surgery? You can expect to have some pain after surgery. This is normal. The pain is typically worse the day after surgery, and quickly begins to get better. Many studies have found that many patients are able to manage their pain after surgery with Over-the-Counter (OTC) medications such as Tylenol and Motrin. If you have a condition that does not allow you to take Tylenol or Motrin, notify your surgical team.  How will I manage my pain? The best strategy for controlling your pain after surgery is around the clock pain control with Tylenol (acetaminophen) and Motrin (ibuprofen or Advil). Alternating these medications with each other allows you to maximize your pain control. In addition to Tylenol and Motrin, you can use heating pads or ice packs on your incisions to help reduce your pain.  How will I alternate your regular strength over-the-counter pain medication? You will take a dose of pain medication every three hours. ; Start by taking 650 mg of Tylenol (2 pills of 325 mg) ; 3 hours later take 600 mg of Motrin (3 pills of 200 mg) ; 3 hours after taking the Motrin take 650 mg of Tylenol ; 3 hours  after that take 600 mg of Motrin.   - 1 -  See example - if your first dose of Tylenol is at 12:00 PM   12:00 PM Tylenol 650 mg (2 pills of 325 mg)  3:00 PM Motrin 600 mg (3 pills of 200 mg)  6:00 PM Tylenol 650 mg (2 pills of 325 mg)  9:00 PM Motrin 600 mg (3 pills of 200 mg)  Continue alternating every 3 hours   We recommend that you follow this schedule around-the-clock for at least 3 days after surgery, or until you feel that it is no longer needed. Use the table on the last page of this handout to keep track of the medications you are taking. Important: Do not take more than 3000mg  of Tylenol or 1600mg  of Motrin in a 24-hour period. Do not take ibuprofen/Motrin if you have a history of bleeding stomach ulcers, severe kidney disease, &/or actively taking a blood thinner  What if I still have pain? If you have pain that is not controlled with the over-the-counter pain medications (Tylenol and Motrin or Advil) you might have what we call "breakthrough" pain. You will receive a prescription for a small amount of an opioid  pain medication such as Oxycodone, Tramadol, or Tylenol with Codeine. Use these opioid pills in the first 24 hours after surgery if you have breakthrough pain. Do not take more than 1 pill every 4-6 hours.  If you still have uncontrolled pain after using all opioid pills, don't hesitate to call our staff using the number provided. We will help make sure you are managing your pain in the best way possible, and if necessary, we can provide a prescription for additional pain medication.   Day 1    Time  Name of Medication Number of pills taken  Amount of Acetaminophen  Pain Level   Comments  AM PM       AM PM       AM PM       AM PM       AM PM       AM PM       AM PM       AM PM       Total Daily amount of Acetaminophen Do not take more than  3,000 mg per day      Day 2    Time  Name of Medication Number of pills taken  Amount of Acetaminophen  Pain  Level   Comments  AM PM       AM PM       AM PM       AM PM       AM PM       AM PM       AM PM       AM PM       Total Daily amount of Acetaminophen Do not take more than  3,000 mg per day      Day 3    Time  Name of Medication Number of pills taken  Amount of Acetaminophen  Pain Level   Comments  AM PM       AM PM       AM PM       AM PM          AM PM       AM PM       AM PM       AM PM       Total Daily amount of Acetaminophen Do not take more than  3,000 mg per day      Day 4    Time  Name of Medication Number of pills taken  Amount of Acetaminophen  Pain Level   Comments  AM PM       AM PM       AM PM       AM PM       AM PM       AM PM       AM PM       AM PM       Total Daily amount of Acetaminophen Do not take more than  3,000 mg per day      Day 5    Time  Name of Medication Number of pills taken  Amount of Acetaminophen  Pain Level   Comments  AM PM       AM PM       AM PM       AM PM       AM PM       AM PM  AM PM       AM PM       Total Daily amount of Acetaminophen Do not take more than  3,000 mg per day       Day 6    Time  Name of Medication Number of pills taken  Amount of Acetaminophen  Pain Level  Comments  AM PM       AM PM       AM PM       AM PM       AM PM       AM PM       AM PM       AM PM       Total Daily amount of Acetaminophen Do not take more than  3,000 mg per day      Day 7    Time  Name of Medication Number of pills taken  Amount of Acetaminophen  Pain Level   Comments  AM PM       AM PM       AM PM       AM PM       AM PM       AM PM       AM PM       AM PM       Total Daily amount of Acetaminophen Do not take more than  3,000 mg per day        For additional information about how and where to safely dispose of unused opioid medications - https://www.morepowerfulnc.org  Disclaimer: This document contains information and/or instructional materials adapted  from [facility] for the typical patient with your condition. It does not replace medical advice from your health care provider because your experience may differ from that of the typical patient. Talk to your health care provider if you have any questions about this document, your condition or your treatment plan. Adapted from [facility]   

## 2020-06-25 NOTE — Transfer of Care (Signed)
Immediate Anesthesia Transfer of Care Note  Patient: Toni Gutierrez  Procedure(s) Performed: LAPAROSCOPIC CHOLECYSTECTOMY WITH POSSIBLE INTRAOPERATIVE CHOLANGIOGRAM (N/A )  Patient Location: PACU  Anesthesia Type:General  Level of Consciousness: awake, alert  and oriented  Airway & Oxygen Therapy: Patient Spontanous Breathing and Patient connected to face mask oxygen  Post-op Assessment: Report given to RN and Post -op Vital signs reviewed and stable  Post vital signs: Reviewed and stable  Last Vitals:  Vitals Value Taken Time  BP 210/94 06/25/20 1526  Temp    Pulse 88 06/25/20 1528  Resp 26 06/25/20 1528  SpO2 100 % 06/25/20 1528  Vitals shown include unvalidated device data.  Last Pain:  Vitals:   06/25/20 1307  TempSrc: Oral  PainSc: 7       Patients Stated Pain Goal: 4 (16/07/37 1062)  Complications: No complications documented.

## 2020-06-25 NOTE — Anesthesia Procedure Notes (Signed)
Procedure Name: Intubation Date/Time: 06/25/2020 2:13 PM Performed by: Kaelen Caughlin D, CRNA Pre-anesthesia Checklist: Patient identified, Emergency Drugs available, Suction available and Patient being monitored Patient Re-evaluated:Patient Re-evaluated prior to induction Oxygen Delivery Method: Circle system utilized Preoxygenation: Pre-oxygenation with 100% oxygen Induction Type: IV induction Ventilation: Mask ventilation without difficulty Laryngoscope Size: Mac and 3 Grade View: Grade I Tube type: Oral Tube size: 7.0 mm Number of attempts: 1 Airway Equipment and Method: Stylet Placement Confirmation: ETT inserted through vocal cords under direct vision,  positive ETCO2 and breath sounds checked- equal and bilateral Secured at: 21 cm Tube secured with: Tape Dental Injury: Teeth and Oropharynx as per pre-operative assessment

## 2020-06-25 NOTE — Anesthesia Preprocedure Evaluation (Signed)
Anesthesia Evaluation  Patient identified by MRN, date of birth, ID band Patient awake    Reviewed: Allergy & Precautions, NPO status , Patient's Chart, lab work & pertinent test results  Airway Mallampati: II  TM Distance: >3 FB Neck ROM: Full    Dental no notable dental hx.    Pulmonary neg pulmonary ROS,    Pulmonary exam normal breath sounds clear to auscultation       Cardiovascular hypertension, Normal cardiovascular exam Rhythm:Regular Rate:Normal     Neuro/Psych negative neurological ROS  negative psych ROS   GI/Hepatic Neg liver ROS, GERD  ,  Endo/Other  negative endocrine ROS  Renal/GU negative Renal ROS  negative genitourinary   Musculoskeletal  (+) Arthritis , Osteoarthritis,    Abdominal   Peds negative pediatric ROS (+)  Hematology negative hematology ROS (+)   Anesthesia Other Findings   Reproductive/Obstetrics negative OB ROS                             Anesthesia Physical  Anesthesia Plan  ASA: II  Anesthesia Plan: General   Post-op Pain Management:    Induction: Intravenous  PONV Risk Score and Plan: 3 and Ondansetron, Dexamethasone, Midazolam and Treatment may vary due to age or medical condition  Airway Management Planned: Oral ETT  Additional Equipment:   Intra-op Plan:   Post-operative Plan: Extubation in OR  Informed Consent: I have reviewed the patients History and Physical, chart, labs and discussed the procedure including the risks, benefits and alternatives for the proposed anesthesia with the patient or authorized representative who has indicated his/her understanding and acceptance.     Dental advisory given  Plan Discussed with: CRNA and Surgeon  Anesthesia Plan Comments:         Anesthesia Quick Evaluation

## 2020-06-25 NOTE — H&P (Signed)
CC: here for surgery  Requesting provider: n/a  HPI: Toni Gutierrez is an 60 y.o. female who is here for laparoscopic cholecystectomy with possible cholangiogram.  I last saw her in the clinic in mid February.  She denies any medical changes since I last saw her.  She is still having twinges of right upper quadrant discomfort.  The patient is a 60 year old female who presents for evaluation of gall stones. She underwent laparoscopic repair of large paraesophageal hiatal hernia repair with toupet fundoplication on May 18. She had known gallstones at the time and I think a component of her abdominal issues was symptomatic cholelithiasis that she was more symptomatic from her large paraesophageal hernia so therefore due to complexity of her hiatal hernia we focused on just doing one procedure that day. She states that she is still been having some intermittent twinges of right upper quadrant discomfort. Not any nausea or vomiting. No heartburn. No early satiety. She states that she is doing great with respect to her hiatal hernia repair. She takes her omeprazole occasionally but not daily. She is also having some left hip pain and is getting an MRI. The right upper quadrant discomfort will generally occur after eating. No acholic stools. No trouble swallowing foods or liquids. She had prior imaging that demonstrated gallstones  Past Medical History:  Diagnosis Date  . Alcohol abuse    No alcohol intake since 2001  . Allergic rhinitis   . Anemia   . Arthritis    "back, hands" (01/06/2018)  . Carpal tunnel syndrome, right   . Chronic lower back pain    MRI of cervical, lumbar spine (all obtained by Dr. Ronnie Derby in 2006)- showing mild DDD with bulges but without disc herniation, at multiple levels; mild facet joint dz, primarily at L3-4 and L4-5 bilaterally  . GERD (gastroesophageal reflux disease)   . Heartburn   . History of COVID-19 05/2019  . History of hiatal hernia     history of  . Hyperlipidemia   . Hypertension   . Left knee pain    s/p ACL reconstruction by Dr. Ronnie Derby  . Shoulder pain, bilateral    MRI right shoulder showing tear of infraspinatus tendon with bursal surface fraying of the infraspinatus    Past Surgical History:  Procedure Laterality Date  . ANTERIOR CRUCIATE LIGAMENT REPAIR Left    By Dr. Ronnie Derby  . CARPAL TUNNEL RELEASE Right 02/08/2019   Procedure: CARPAL TUNNEL RELEASE;  Surgeon: Latanya Maudlin, MD;  Location: [facility];  Service: Orthopedics;  Laterality: Right;  . CESAREAN SECTION  1986  . ESOPHAGOGASTRODUODENOSCOPY N/A 07/11/2019   Procedure: UPPER ESOPHAGOGASTRODUODENOSCOPY (EGD);  Surgeon: Greer Pickerel, MD;  Location: [facility];  Service: General;  Laterality: N/A;  . HIATAL HERNIA REPAIR N/A 07/11/2019   Procedure: LAPAROSCOPIC REPAIR OF PARAESOPHAGEAL HERNIA WITH TOUPET FUNDOPLICATION AND GASTROPEXY;  Surgeon: Greer Pickerel, MD;  Location: [facility];  Service: General;  Laterality: N/A;  . SHOULDER ARTHROSCOPY W/ ROTATOR CUFF REPAIR Right   . TUBAL LIGATION  1989    Family History  Problem Relation Age of Onset  . Stroke Mother   . Colon cancer Neg Hx   . Esophageal cancer Neg Hx   . Rectal cancer Neg Hx   . Stomach cancer Neg Hx     Social:  reports that she has never smoked. She has never used smokeless tobacco. She reports previous alcohol use of about 24.0 standard drinks of alcohol per week. She  reports previous drug use.  Allergies: No Known Allergies  Medications: I have reviewed the patient's current medications.   ROS - all of the below systems have been reviewed with the patient and positives are indicated with bold text General: chills, fever or night sweats Eyes: blurry vision or double vision ENT: epistaxis or sore throat Allergy/Immunology: itchy/watery eyes or nasal congestion Hematologic/Lymphatic: bleeding problems, blood clots or swollen lymph nodes Endocrine: temperature  intolerance or unexpected weight changes Breast: new or changing breast lumps or nipple discharge Resp: cough, shortness of breath, or wheezing CV: chest pain or dyspnea on exertion GI: as per HPI GU: dysuria, trouble voiding, or hematuria MSK: joint pain or joint stiffness Neuro: TIA or stroke symptoms Derm: pruritus and skin lesion changes Psych: anxiety and depression  PE Blood pressure (!) 182/91, temperature 98.1 F (36.7 C), temperature source Oral, resp. rate 18, height 4\' 11"  (1.499 m), weight 59.1 kg, last menstrual period 12/25/2010, SpO2 98 %. Constitutional: NAD; conversant; no deformities Eyes: Moist conjunctiva; no lid lag; anicteric; PERRL Neck: Trachea midline; no thyromegaly Lungs: Normal respiratory effort; no tactile fremitus CV: RRR; no palpable thrills; no pitting edema GI: Abd old trocar scars; no palpable hepatosplenomegaly MSK: Normal gait; no clubbing/cyanosis Psychiatric: Appropriate affect; alert and oriented x3 Lymphatic: No palpable cervical or axillary lymphadenopathy Skin:no rash/edema/lesions  No results found for this or any previous visit (from the past 27 hour(s)).  No results found.  Imaging: reviewed  A/P: Toni Gutierrez is an 60 y.o. female with symptomatic cholelithiasis  To operating room for laparoscopic cholecystectomy possible cholangiogram Enhanced recovery protocol IV antibiotic Discussed surgical resident involvement with her case All of her questions asked and answered  Leighton Ruff. Redmond Pulling, MD, FACS General, Bariatric, & Minimally Invasive Surgery [facility], [place]

## 2020-06-25 NOTE — Anesthesia Postprocedure Evaluation (Signed)
Anesthesia Post Note  Patient: Toni Gutierrez  Procedure(s) Performed: LAPAROSCOPIC CHOLECYSTECTOMY WITH POSSIBLE INTRAOPERATIVE CHOLANGIOGRAM (N/A )     Patient location during evaluation: PACU Anesthesia Type: General Level of consciousness: awake and alert Pain management: pain level controlled Vital Signs Assessment: post-procedure vital signs reviewed and stable Respiratory status: spontaneous breathing, nonlabored ventilation and respiratory function stable Cardiovascular status: blood pressure returned to baseline and stable Postop Assessment: no apparent nausea or vomiting Anesthetic complications: no   No complications documented.  Last Vitals:  Vitals:   06/25/20 1615 06/25/20 1630  BP: (!) 165/90 (!) 176/78  Pulse: 70 63  Resp: 13 11  Temp:    SpO2: 100% 100%    Last Pain:  Vitals:   06/25/20 1630  TempSrc:   PainSc: 0-No pain                 Lynda Rainwater

## 2020-06-26 ENCOUNTER — Encounter (HOSPITAL_COMMUNITY): Payer: Self-pay | Admitting: General Surgery

## 2020-06-27 LAB — SURGICAL PATHOLOGY

## 2020-08-29 ENCOUNTER — Other Ambulatory Visit: Payer: Self-pay | Admitting: Orthopedic Surgery

## 2020-08-29 DIAGNOSIS — Z01811 Encounter for preprocedural respiratory examination: Secondary | ICD-10-CM

## 2020-09-13 NOTE — Progress Notes (Addendum)
PCP - Dr. Dustin Folks Cardiologist - no  PPM/ICD -  Device Orders -  Rep Notified -   Chest x-ray -  EKG - 06-01-20 Stress Test - 2010 ECHO - 2019 Cardiac Cath -   Sleep Study -  CPAP -   Fasting Blood Sugar -  Checks Blood Sugar _____ times a day  Blood Thinner Instructions: Aspirin Instructions:  ERAS Protcol - PRE-SURGERY Ensure   COVID TEST- ambulatory  Fully vaccinated and boosted  Activity--ABLE TO WALK A FLIGHT OF STAIRS WITHOUT sob  Anesthesia review: HTN  Patient denies shortness of breath, fever, cough and chest pain at PAT appointment   All instructions explained to the patient, with a verbal understanding of the material. Patient agrees to go over the instructions while at home for a better understanding. Patient also instructed to self quarantine after being tested for COVID-19. The opportunity to ask questions was provided.

## 2020-09-13 NOTE — Patient Instructions (Addendum)
DUE TO COVID-19 ONLY ONE VISITOR IS ALLOWED TO COME WITH YOU AND STAY IN THE WAITING ROOM ONLY DURING PRE OP AND PROCEDURE DAY OF SURGERY.   TWO VISITOR  MAY VISIT WITH YOU AFTER SURGERY IN YOUR PRIVATE ROOM DURING VISITING HOURS ONLY!            Your procedure is scheduled on: 09-19-20   Report to [facility] Main  Entrance   Report to admitting at        [place]    AM     Call this number if you have problems the morning of surgery [phone]    Remember: NO SOLID FOOD AFTER MIDNIGHT THE NIGHT PRIOR TO SURGERY.   NOTHING BY MOUTH EXCEPT CLEAR LIQUIDS UNTIL 0915 am .   PLEASE FINISH ENSURE DRINK PER SURGEON ORDER  WHICH NEEDS TO BE COMPLETED AT         0915 am then nothing by mouth.    BRUSH YOUR TEETH MORNING OF SURGERY AND RINSE YOUR MOUTH OUT, NO CHEWING GUM CANDY OR MINTS.     Take these medicines the morning of surgery with A SIP OF WATER: omeprazole, oxycodone if needed, lyrica  DO NOT TAKE ANY DIABETIC MEDICATIONS DAY OF YOUR SURGERY                               You may not have any metal on your body including hair pins and              piercings  Do not wear jewelry, make-up, lotions, powders or perfumes, deodorant             Do not wear nail polish on your fingernails or toenails .  Do not shave  48 hours prior to surgery.               Do not bring valuables to the hospital. [place].  Contacts, dentures or bridgework may not be worn into surgery.      Patients discharged the day of surgery will not be allowed to drive home. IF YOU ARE HAVING SURGERY AND GOING HOME THE SAME DAY, YOU MUST HAVE AN ADULT TO DRIVE YOU HOME AND BE WITH YOU FOR 24 HOURS. YOU MAY GO HOME BY TAXI OR UBER OR ORTHERWISE, BUT AN ADULT MUST ACCOMPANY YOU HOME AND STAY WITH YOU FOR 24 HOURS.  Name and phone number of your driver:  Special Instructions: N/A              Please read over the following fact sheets you were  given: _____________________________________________________________________ [facility]- Preparing for Total Shoulder Arthroplasty    Before surgery, you can play an important role. Because skin is not sterile, your skin needs to be as free of germs as possible. You can reduce the number of germs on your skin by using the following products. Benzoyl Peroxide Gel Reduces the number of germs present on the skin Applied twice a day to shoulder area starting two days before surgery    ==================================================================  Please follow these instructions carefully:  BENZOYL PEROXIDE 5% GEL  Please do not use if you have an allergy to benzoyl peroxide.   If your skin becomes reddened/irritated stop using the benzoyl peroxide.  Starting two days before surgery, apply as follows: Apply benzoyl peroxide  in the morning and at night. Apply after taking a shower. If you are not taking a shower clean entire shoulder front, back, and side along with the armpit with a clean wet washcloth.  Place a quarter-sized dollop on your shoulder and rub in thoroughly, making sure to cover the front, back, and side of your shoulder, along with the armpit.   2 days before ____ AM   ____ PM              1 day before ____ AM   ____ PM                         Do this twice a day for two days.  (Last application is the night before surgery, AFTER using the CHG soap as described below).  Do NOT apply benzoyl peroxide gel on the day of surgery.             [facility] - Preparing for Surgery Before surgery, you can play an important role.  Because skin is not sterile, your skin needs to be as free of germs as possible.  You can reduce the number of germs on your skin by washing with CHG (chlorahexidine gluconate) soap before surgery.  CHG is an antiseptic cleaner which kills germs and bonds with the skin to continue killing germs even after washing. Please DO NOT use if you have an  allergy to CHG or antibacterial soaps.  If your skin becomes reddened/irritated stop using the CHG and inform your nurse when you arrive at Short Stay. Do not shave (including legs and underarms) for at least 48 hours prior to the first CHG shower.  You may shave your face/neck. Please follow these instructions carefully:  1.  Shower with CHG Soap the night before surgery and the  morning of Surgery.  2.  If you choose to wash your hair, wash your hair first as usual with your  normal  shampoo.  3.  After you shampoo, rinse your hair and body thoroughly to remove the  shampoo.                           4.  Use CHG as you would any other liquid soap.  You can apply chg directly  to the skin and wash                       Gently with a scrungie or clean washcloth.  5.  Apply the CHG Soap to your body ONLY FROM THE NECK DOWN.   Do not use on face/ open                           Wound or open sores. Avoid contact with eyes, ears mouth and genitals (private parts).                       Wash face,  Genitals (private parts) with your normal soap.             6.  Wash thoroughly, paying special attention to the area where your surgery  will be performed.  7.  Thoroughly rinse your body with warm water from the neck down.  8.  DO NOT shower/wash with your normal soap after using and rinsing off  the CHG Soap.  9.  Pat yourself dry with a clean towel.            10.  Wear clean pajamas.            11.  Place clean sheets on your bed the night of your first shower and do not  sleep with pets. Day of Surgery : Do not apply any lotions/deodorants the morning of surgery.  Please wear clean clothes to the hospital/surgery center.  FAILURE TO FOLLOW THESE INSTRUCTIONS MAY RESULT IN THE CANCELLATION OF YOUR SURGERY PATIENT SIGNATURE_________________________________  NURSE SIGNATURE__________________________________  ____  Incentive Spirometer  An incentive spirometer is a tool that can help  keep your lungs clear and active. This tool measures how well you are filling your lungs with each breath. Taking long deep breaths may help reverse or decrease the chance of developing breathing (pulmonary) problems (especially infection) following: A long period of time when you are unable to move or be active. BEFORE THE PROCEDURE  If the spirometer includes an indicator to show your best effort, your nurse or respiratory therapist will set it to a desired goal. If possible, sit up straight or lean slightly forward. Try not to slouch. Hold the incentive spirometer in an upright position. INSTRUCTIONS FOR USE  Sit on the edge of your bed if possible, or sit up as far as you can in bed or on a chair. Hold the incentive spirometer in an upright position. Breathe out normally. Place the mouthpiece in your mouth and seal your lips tightly around it. Breathe in slowly and as deeply as possible, raising the piston or the ball toward the top of the column. Hold your breath for 3-5 seconds or for as long as possible. Allow the piston or ball to fall to the bottom of the column. Remove the mouthpiece from your mouth and breathe out normally. Rest for a few seconds and repeat Steps 1 through 7 at least 10 times every 1-2 hours when you are awake. Take your time and take a few normal breaths between deep breaths. The spirometer may include an indicator to show your best effort. Use the indicator as a goal to work toward during each repetition. After each set of 10 deep breaths, practice coughing to be sure your lungs are clear. If you have an incision (the cut made at the time of surgery), support your incision when coughing by placing a pillow or rolled up towels firmly against it. Once you are able to get out of bed, walk around indoors and cough well. You may stop using the incentive spirometer when instructed by your caregiver.  RISKS AND COMPLICATIONS Take your time so you do not get dizzy or  light-headed. If you are in pain, you may need to take or ask for pain medication before doing incentive spirometry. It is harder to take a deep breath if you are having pain. AFTER USE Rest and breathe slowly and easily. It can be helpful to keep track of a log of your progress. Your caregiver can provide you with a simple table to help with this. If you are using the spirometer at home, follow these instructions: [place] IF:  You are having difficultly using the spirometer. You have trouble using the spirometer as often as instructed. Your pain medication is not giving enough relief while using the spirometer. You develop fever of 100.5 F (38.1 C) or higher. SEEK IMMEDIATE MEDICAL CARE IF:  You cough up bloody sputum that had not  been present before. You develop fever of 102 F (38.9 C) or greater. You develop worsening pain at or near the incision site. MAKE SURE YOU:  Understand these instructions. Will watch your condition. Will get help right away if you are not doing well or get worse. Document Released: 06/22/2006 Document Revised: 05/04/2011 Document Reviewed: 08/23/2006 [facility] Patient Information 2014 [employer], [place].   ________________________________________________________________________ ____________________________________________________________________

## 2020-09-16 ENCOUNTER — Other Ambulatory Visit: Payer: Self-pay

## 2020-09-16 ENCOUNTER — Ambulatory Visit (HOSPITAL_COMMUNITY)
Admission: RE | Admit: 2020-09-16 | Discharge: 2020-09-16 | Disposition: A | Discharge status: Home / Self Care | Payer: Medicare Other | Source: Ambulatory Visit | Attending: Orthopedic Surgery | Admitting: Orthopedic Surgery

## 2020-09-16 ENCOUNTER — Encounter (HOSPITAL_COMMUNITY)
Admission: RE | Admit: 2020-09-16 | Discharge: 2020-09-16 | Disposition: A | Discharge status: Home / Self Care | Payer: Medicare Other | Source: Ambulatory Visit | Attending: Orthopedic Surgery | Admitting: Orthopedic Surgery

## 2020-09-16 ENCOUNTER — Encounter (HOSPITAL_COMMUNITY): Payer: Self-pay

## 2020-09-16 DIAGNOSIS — Z01811 Encounter for preprocedural respiratory examination: Secondary | ICD-10-CM | POA: Insufficient documentation

## 2020-09-16 DIAGNOSIS — Z01818 Encounter for other preprocedural examination: Secondary | ICD-10-CM | POA: Diagnosis present

## 2020-09-16 LAB — CBC WITH DIFFERENTIAL/PLATELET
Abs Immature Granulocytes: 0.02 10*3/uL (ref 0.00–0.07)
Basophils Absolute: 0.1 10*3/uL (ref 0.0–0.1)
Basophils Relative: 1 %
Eosinophils Absolute: 0.1 10*3/uL (ref 0.0–0.5)
Eosinophils Relative: 2 %
HCT: 39 % (ref 36.0–46.0)
Hemoglobin: 13.2 g/dL (ref 12.0–15.0)
Immature Granulocytes: 0 %
Lymphocytes Relative: 32 %
Lymphs Abs: 2.2 10*3/uL (ref 0.7–4.0)
MCH: 32.7 pg (ref 26.0–34.0)
MCHC: 33.8 g/dL (ref 30.0–36.0)
MCV: 96.5 fL (ref 80.0–100.0)
Monocytes Absolute: 0.4 10*3/uL (ref 0.1–1.0)
Monocytes Relative: 6 %
Neutro Abs: 4.2 10*3/uL (ref 1.7–7.7)
Neutrophils Relative %: 59 %
Platelets: 265 10*3/uL (ref 150–400)
RBC: 4.04 MIL/uL (ref 3.87–5.11)
RDW: 12.9 % (ref 11.5–15.5)
WBC: 7.1 10*3/uL (ref 4.0–10.5)
nRBC: 0 % (ref 0.0–0.2)

## 2020-09-16 LAB — SURGICAL PCR SCREEN
MRSA, PCR: NEGATIVE
Staphylococcus aureus: NEGATIVE

## 2020-09-16 LAB — COMPREHENSIVE METABOLIC PANEL
ALT: 39 U/L (ref 0–44)
AST: 32 U/L (ref 15–41)
Albumin: 4.2 g/dL (ref 3.5–5.0)
Alkaline Phosphatase: 81 U/L (ref 38–126)
Anion gap: 8 (ref 5–15)
BUN: 6 mg/dL (ref 6–20)
CO2: 23 mmol/L (ref 22–32)
Calcium: 9.1 mg/dL (ref 8.9–10.3)
Chloride: 110 mmol/L (ref 98–111)
Creatinine, Ser: 0.75 mg/dL (ref 0.44–1.00)
GFR, Estimated: >60 mL/min (ref >60)
Glucose, Bld: 85 mg/dL (ref 70–99)
Potassium: 3.7 mmol/L (ref 3.5–5.1)
Sodium: 141 mmol/L (ref 135–145)
Total Bilirubin: 0.5 mg/dL (ref 0.3–1.2)
Total Protein: 7.5 g/dL (ref 6.5–8.1)

## 2020-09-16 LAB — APTT: aPTT: 35 seconds (ref 24–36)

## 2020-09-16 LAB — URINALYSIS, ROUTINE W REFLEX MICROSCOPIC
Bilirubin Urine: NEGATIVE
Glucose, UA: NEGATIVE mg/dL
Hgb urine dipstick: NEGATIVE
Ketones, ur: NEGATIVE mg/dL
Leukocytes,Ua: NEGATIVE
Nitrite: NEGATIVE
Protein, ur: NEGATIVE mg/dL
Specific Gravity, Urine: 1.016 (ref 1.005–1.030)
pH: 5 (ref 5.0–8.0)

## 2020-09-19 ENCOUNTER — Encounter (HOSPITAL_COMMUNITY)
Admission: RE | Disposition: A | Discharge status: Home / Self Care | Payer: Self-pay | Source: Home / Self Care | Attending: Orthopedic Surgery

## 2020-09-19 ENCOUNTER — Encounter (HOSPITAL_COMMUNITY): Payer: Self-pay | Admitting: Orthopedic Surgery

## 2020-09-19 ENCOUNTER — Other Ambulatory Visit: Payer: Self-pay

## 2020-09-19 ENCOUNTER — Ambulatory Visit (HOSPITAL_COMMUNITY): Payer: Medicare Other | Admitting: Certified Registered Nurse Anesthetist

## 2020-09-19 ENCOUNTER — Ambulatory Visit (HOSPITAL_COMMUNITY)
Admission: RE | Admit: 2020-09-19 | Discharge: 2020-09-19 | Disposition: A | Discharge status: Home / Self Care | Payer: Medicare Other | Attending: Orthopedic Surgery | Admitting: Orthopedic Surgery

## 2020-09-19 DIAGNOSIS — E785 Hyperlipidemia, unspecified: Secondary | ICD-10-CM | POA: Diagnosis not present

## 2020-09-19 DIAGNOSIS — Z9049 Acquired absence of other specified parts of digestive tract: Secondary | ICD-10-CM | POA: Diagnosis not present

## 2020-09-19 DIAGNOSIS — Z8616 Personal history of COVID-19: Secondary | ICD-10-CM | POA: Insufficient documentation

## 2020-09-19 DIAGNOSIS — Z79899 Other long term (current) drug therapy: Secondary | ICD-10-CM | POA: Diagnosis not present

## 2020-09-19 DIAGNOSIS — M75102 Unspecified rotator cuff tear or rupture of left shoulder, not specified as traumatic: Secondary | ICD-10-CM | POA: Diagnosis present

## 2020-09-19 DIAGNOSIS — Z98891 History of uterine scar from previous surgery: Secondary | ICD-10-CM | POA: Insufficient documentation

## 2020-09-19 DIAGNOSIS — I10 Essential (primary) hypertension: Secondary | ICD-10-CM | POA: Insufficient documentation

## 2020-09-19 DIAGNOSIS — Z8719 Personal history of other diseases of the digestive system: Secondary | ICD-10-CM | POA: Diagnosis not present

## 2020-09-19 DIAGNOSIS — M19012 Primary osteoarthritis, left shoulder: Secondary | ICD-10-CM | POA: Insufficient documentation

## 2020-09-19 HISTORY — PX: REVERSE SHOULDER ARTHROPLASTY: SHX5054

## 2020-09-19 LAB — TYPE AND SCREEN
ABO/RH(D): O POS
Antibody Screen: NEGATIVE

## 2020-09-19 SURGERY — ARTHROPLASTY, SHOULDER, TOTAL, REVERSE
Anesthesia: General | Site: Shoulder | Laterality: Left

## 2020-09-19 MED ORDER — FENTANYL CITRATE (PF) 100 MCG/2ML IJ SOLN
25.0000 ug | INTRAMUSCULAR | Status: DC | PRN
  Administered 2020-09-19 (×2): 50 ug via INTRAVENOUS

## 2020-09-19 MED ORDER — CHLORHEXIDINE GLUCONATE 0.12 % MT SOLN
15.0000 mL | Freq: Once | OROMUCOSAL | Status: AC
  Administered 2020-09-19: 15 mL via OROMUCOSAL

## 2020-09-19 MED ORDER — DEXAMETHASONE SODIUM PHOSPHATE 10 MG/ML IJ SOLN
INTRAMUSCULAR | Status: AC
  Filled 2020-09-19: qty 1

## 2020-09-19 MED ORDER — LABETALOL HCL 5 MG/ML IV SOLN
5.0000 mg | Freq: Once | INTRAVENOUS | Status: AC
  Administered 2020-09-19: 5 mg via INTRAVENOUS

## 2020-09-19 MED ORDER — SODIUM CHLORIDE 0.9 % IV SOLN
2.0000 g | INTRAVENOUS | Status: AC
  Administered 2020-09-19: 2 g via INTRAVENOUS
  Filled 2020-09-19: qty 2

## 2020-09-19 MED ORDER — ONDANSETRON HCL 4 MG/2ML IJ SOLN
INTRAMUSCULAR | Status: AC
  Filled 2020-09-19: qty 2

## 2020-09-19 MED ORDER — GLYCOPYRROLATE PF 0.2 MG/ML IJ SOSY
PREFILLED_SYRINGE | INTRAMUSCULAR | Status: AC
  Filled 2020-09-19: qty 1

## 2020-09-19 MED ORDER — SODIUM CHLORIDE 0.9 % IR SOLN
Status: DC | PRN
  Administered 2020-09-19: 1000 mL

## 2020-09-19 MED ORDER — ONDANSETRON HCL 4 MG/2ML IJ SOLN
INTRAMUSCULAR | Status: DC | PRN
  Administered 2020-09-19: 4 mg via INTRAVENOUS

## 2020-09-19 MED ORDER — LIDOCAINE 2% (20 MG/ML) 5 ML SYRINGE
INTRAMUSCULAR | Status: AC
  Filled 2020-09-19: qty 5

## 2020-09-19 MED ORDER — FENTANYL CITRATE (PF) 100 MCG/2ML IJ SOLN
INTRAMUSCULAR | Status: AC
  Filled 2020-09-19: qty 2

## 2020-09-19 MED ORDER — OXYCODONE HCL 5 MG PO TABS
ORAL_TABLET | ORAL | Status: AC
  Filled 2020-09-19: qty 1

## 2020-09-19 MED ORDER — PHENYLEPHRINE 40 MCG/ML (10ML) SYRINGE FOR IV PUSH (FOR BLOOD PRESSURE SUPPORT)
PREFILLED_SYRINGE | INTRAVENOUS | Status: DC | PRN
  Administered 2020-09-19: 120 ug via INTRAVENOUS

## 2020-09-19 MED ORDER — OXYCODONE-ACETAMINOPHEN 10-325 MG PO TABS: 1.0000 | ORAL_TABLET | ORAL | 0 refills | Status: AC | PRN

## 2020-09-19 MED ORDER — BUPIVACAINE LIPOSOME 1.3 % IJ SUSP
INTRAMUSCULAR | Status: DC | PRN
  Administered 2020-09-19: 10 mL via PERINEURAL

## 2020-09-19 MED ORDER — LACTATED RINGERS IV SOLN: INTRAVENOUS | Status: DC

## 2020-09-19 MED ORDER — ONDANSETRON HCL 4 MG/2ML IJ SOLN: 4.0000 mg | Freq: Once | INTRAMUSCULAR | Status: DC | PRN

## 2020-09-19 MED ORDER — SUGAMMADEX SODIUM 200 MG/2ML IV SOLN
INTRAVENOUS | Status: DC | PRN
  Administered 2020-09-19: 200 mg via INTRAVENOUS

## 2020-09-19 MED ORDER — PROPOFOL 10 MG/ML IV BOLUS
INTRAVENOUS | Status: DC | PRN
  Administered 2020-09-19: 150 mg via INTRAVENOUS

## 2020-09-19 MED ORDER — WATER FOR IRRIGATION, STERILE IR SOLN
Status: DC | PRN
  Administered 2020-09-19: 2000 mL

## 2020-09-19 MED ORDER — TRANEXAMIC ACID-NACL 1000-0.7 MG/100ML-% IV SOLN
1000.0000 mg | INTRAVENOUS | Status: AC
  Administered 2020-09-19: 1000 mg via INTRAVENOUS
  Filled 2020-09-19: qty 100

## 2020-09-19 MED ORDER — OXYCODONE HCL 5 MG PO TABS
5.0000 mg | ORAL_TABLET | Freq: Once | ORAL | Status: AC | PRN
  Administered 2020-09-19: 5 mg via ORAL

## 2020-09-19 MED ORDER — ROCURONIUM BROMIDE 10 MG/ML (PF) SYRINGE
PREFILLED_SYRINGE | INTRAVENOUS | Status: DC | PRN
  Administered 2020-09-19: 50 mg via INTRAVENOUS

## 2020-09-19 MED ORDER — PHENYLEPHRINE HCL-NACL 10-0.9 MG/250ML-% IV SOLN
INTRAVENOUS | Status: DC | PRN
  Administered 2020-09-19: 40 ug/min via INTRAVENOUS

## 2020-09-19 MED ORDER — ROCURONIUM BROMIDE 10 MG/ML (PF) SYRINGE
PREFILLED_SYRINGE | INTRAVENOUS | Status: AC
  Filled 2020-09-19: qty 10

## 2020-09-19 MED ORDER — 0.9 % SODIUM CHLORIDE (POUR BTL) OPTIME
TOPICAL | Status: DC | PRN
  Administered 2020-09-19: 1000 mL

## 2020-09-19 MED ORDER — FENTANYL CITRATE (PF) 100 MCG/2ML IJ SOLN
50.0000 ug | INTRAMUSCULAR | Status: DC
  Administered 2020-09-19: 100 ug via INTRAVENOUS
  Filled 2020-09-19: qty 2

## 2020-09-19 MED ORDER — MIDAZOLAM HCL 2 MG/2ML IJ SOLN
1.0000 mg | INTRAMUSCULAR | Status: DC
  Administered 2020-09-19: 2 mg via INTRAVENOUS
  Filled 2020-09-19: qty 2

## 2020-09-19 MED ORDER — LABETALOL HCL 5 MG/ML IV SOLN
INTRAVENOUS | Status: AC
  Filled 2020-09-19: qty 4

## 2020-09-19 MED ORDER — DEXAMETHASONE SODIUM PHOSPHATE 4 MG/ML IJ SOLN
INTRAMUSCULAR | Status: DC | PRN
  Administered 2020-09-19: 10 mg via INTRAVENOUS

## 2020-09-19 MED ORDER — BUPIVACAINE-EPINEPHRINE (PF) 0.5% -1:200000 IJ SOLN
INTRAMUSCULAR | Status: DC | PRN
  Administered 2020-09-19: 20 mL via PERINEURAL

## 2020-09-19 MED ORDER — OXYCODONE HCL 5 MG/5ML PO SOLN
5.0000 mg | Freq: Once | ORAL | Status: AC | PRN
Start: 2020-09-19 — End: 2020-09-19

## 2020-09-19 MED ORDER — ORAL CARE MOUTH RINSE: 15.0000 mL | Freq: Once | OROMUCOSAL | Status: AC

## 2020-09-19 MED ORDER — LIDOCAINE 2% (20 MG/ML) 5 ML SYRINGE
INTRAMUSCULAR | Status: DC | PRN
  Administered 2020-09-19: 50 mg via INTRAVENOUS

## 2020-09-19 SURGICAL SUPPLY — 72 items
BAG COUNTER SPONGE SURGICOUNT (BAG) IMPLANT
BAG ZIPLOCK 12X15 (MISCELLANEOUS) ×2 IMPLANT
BASEPLATE P2 COATD GLND 6.5X30 (Shoulder) ×1 IMPLANT
BIT DRILL 1.6MX128 (BIT) IMPLANT
BIT DRILL 2.5 DIA 127 CALI (BIT) ×2 IMPLANT
BIT DRILL 4 DIA CALIBRATED (BIT) ×2 IMPLANT
BLADE SAW SAG 73X25 THK (BLADE) ×1
BLADE SAW SGTL 73X25 THK (BLADE) ×1 IMPLANT
BOOTIES KNEE HIGH SLOAN (MISCELLANEOUS) ×4 IMPLANT
COOLER ICEMAN CLASSIC (MISCELLANEOUS) IMPLANT
COVER BACK TABLE 60X90IN (DRAPES) ×2 IMPLANT
COVER SURGICAL LIGHT HANDLE (MISCELLANEOUS) ×2 IMPLANT
DRAPE INCISE IOBAN 66X45 STRL (DRAPES) ×2 IMPLANT
DRAPE ORTHO SPLIT 77X108 STRL (DRAPES) ×4
DRAPE POUCH INSTRU U-SHP 10X18 (DRAPES) ×2 IMPLANT
DRAPE SHEET LG 3/4 BI-LAMINATE (DRAPES) ×2 IMPLANT
DRAPE SURG 17X11 SM STRL (DRAPES) ×2 IMPLANT
DRAPE SURG ORHT 6 SPLT 77X108 (DRAPES) ×2 IMPLANT
DRAPE TOP 10253 STERILE (DRAPES) ×2 IMPLANT
DRAPE U-SHAPE 47X51 STRL (DRAPES) ×2 IMPLANT
DRSG AQUACEL AG ADV 3.5X 6 (GAUZE/BANDAGES/DRESSINGS) ×2 IMPLANT
DURAPREP 26ML APPLICATOR (WOUND CARE) ×4 IMPLANT
ELECT BLADE TIP CTD 4 INCH (ELECTRODE) ×2 IMPLANT
ELECT REM PT RETURN 15FT ADLT (MISCELLANEOUS) ×2 IMPLANT
GLOVE SRG 8 PF TXTR STRL LF DI (GLOVE) ×1 IMPLANT
GLOVE SURG ENC MOIS LTX SZ6.5 (GLOVE) ×2 IMPLANT
GLOVE SURG ENC MOIS LTX SZ7.5 (GLOVE) ×2 IMPLANT
GLOVE SURG UNDER POLY LF SZ6.5 (GLOVE) ×2 IMPLANT
GLOVE SURG UNDER POLY LF SZ8 (GLOVE) ×2
GOWN STRL REUS W/TWL LRG LVL3 (GOWN DISPOSABLE) ×2 IMPLANT
GOWN STRL REUS W/TWL XL LVL3 (GOWN DISPOSABLE) ×2 IMPLANT
HANDPIECE INTERPULSE COAX TIP (DISPOSABLE) ×2
HOOD PEEL AWAY FLYTE STAYCOOL (MISCELLANEOUS) ×6 IMPLANT
HUMERA STEM SM SHELL SHOU 10 (Miscellaneous) ×2 IMPLANT
INSERT SMALL SOCKET 32MM NEU (Insert) ×2 IMPLANT
KIT BASIN OR (CUSTOM PROCEDURE TRAY) ×2 IMPLANT
KIT TURNOVER KIT A (KITS) ×2 IMPLANT
MANIFOLD NEPTUNE II (INSTRUMENTS) ×2 IMPLANT
NEEDLE TROCAR POINT SZ 2 1/2 (NEEDLE) IMPLANT
NS IRRIG 1000ML POUR BTL (IV SOLUTION) ×2 IMPLANT
P2 COATDE GLNOID BSEPLT 6.5X30 (Shoulder) ×2 IMPLANT
PACK SHOULDER (CUSTOM PROCEDURE TRAY) ×2 IMPLANT
PAD COLD SHLDR WRAP-ON (PAD) IMPLANT
PROTECTOR NERVE ULNAR (MISCELLANEOUS) IMPLANT
RESTRAINT HEAD UNIVERSAL NS (MISCELLANEOUS) IMPLANT
RETRIEVER SUT HEWSON (MISCELLANEOUS) IMPLANT
SCREW BONE LOCKING RSP 5.0X14 (Screw) ×4 IMPLANT
SCREW BONE RSP LOCK 5X14 (Screw) ×2 IMPLANT
SCREW BONE RSP LOCK 5X26 (Screw) ×2 IMPLANT
SCREW BONE RSP LOCKING 5.0X26 (Screw) ×4 IMPLANT
SCREW RETAIN W/HEAD 4MM OFFSET (Shoulder) ×2 IMPLANT
SET HNDPC FAN SPRY TIP SCT (DISPOSABLE) ×1 IMPLANT
SLING ARM IMMOBILIZER LRG (SOFTGOODS) IMPLANT
SLING ARM IMMOBILIZER MED (SOFTGOODS) IMPLANT
SPONGE T-LAP 18X18 ~~LOC~~+RFID (SPONGE) ×2 IMPLANT
STEM HUMERAL SM SHELL SHOU 10 (Miscellaneous) ×1 IMPLANT
STRIP CLOSURE SKIN 1/2X4 (GAUZE/BANDAGES/DRESSINGS) ×4 IMPLANT
SUCTION FRAZIER HANDLE 10FR (MISCELLANEOUS)
SUCTION TUBE FRAZIER 10FR DISP (MISCELLANEOUS) IMPLANT
SUPPORT WRAP ARM LG (MISCELLANEOUS) IMPLANT
SUT ETHIBOND 2 V 37 (SUTURE) IMPLANT
SUT FIBERWIRE #2 38 REV NDL BL (SUTURE)
SUT MNCRL AB 4-0 PS2 18 (SUTURE) ×2 IMPLANT
SUT VIC AB 2-0 CT1 27 (SUTURE) ×2
SUT VIC AB 2-0 CT1 TAPERPNT 27 (SUTURE) ×1 IMPLANT
SUTURE FIBERWR#2 38 REV NDL BL (SUTURE) IMPLANT
TAPE LABRALWHITE 1.5X36 (TAPE) IMPLANT
TAPE STRIPS DRAPE STRL (GAUZE/BANDAGES/DRESSINGS) ×2 IMPLANT
TAPE SUT LABRALTAP WHT/BLK (SUTURE) IMPLANT
TOWEL OR 17X26 10 PK STRL BLUE (TOWEL DISPOSABLE) ×2 IMPLANT
TOWEL OR NON WOVEN STRL DISP B (DISPOSABLE) ×2 IMPLANT
WATER STERILE IRR 1000ML POUR (IV SOLUTION) ×2 IMPLANT

## 2020-09-19 NOTE — Progress Notes (Signed)
AssistedDr. Royce Macadamia with left, ultrasound guided, interscalene  block. Side rails up, monitors on throughout procedure. See vital signs in flow sheet. Tolerated Procedure well.

## 2020-09-19 NOTE — Op Note (Signed)
Procedure(s): REVERSE SHOULDER ARTHROPLASTY Procedure Note  Toni Gutierrez female 60 y.o. 09/19/2020   Preoperative diagnosis: Left shoulder rotator cuff tear arthropathy  Postoperative diagnosis: Same  Procedure(s) and Anesthesia Type:    * REVERSE SHOULDER ARTHROPLASTY - General   Indications:  60 y.o. female  With endstage left shoulder arthritis with irrepairable rotator cuff tear. Pain and dysfunction interfered with quality of life and nonoperative treatment with activity modification, NSAIDS and injections failed.     Surgeon: Toni Gutierrez   Assistants: Toni Hail PA-C Toni Gutierrez was present and scrubbed throughout the procedure and was essential in positioning, retraction, exposure, and closure)  Anesthesia: General endotracheal anesthesia with preoperative interscalene block given by the attending anesthesiologist    Procedure Detail  REVERSE SHOULDER ARTHROPLASTY   Estimated Blood Loss:  200 mL         Drains: none  Blood Given: none          Specimens: none        Complications:  * No complications entered in OR log *         Disposition: PACU - hemodynamically stable.         Condition: stable      OPERATIVE FINDINGS:  A DJO Altivate pressfit reverse total shoulder arthroplasty was placed with a  size 10 short stem, a 32-4 glenosphere, and a standard-mm poly insert. The base plate  fixation was excellent.  PROCEDURE: The patient was identified in the preoperative holding area  where I personally marked the operative site after verifying site, side,  and procedure with the patient. An interscalene block given by  the attending anesthesiologist in the holding area and the patient was taken back to the operating room where all extremities were  carefully padded in position after general anesthesia was induced. She  was placed in a beach-chair position and the operative upper extremity was  prepped and draped in a standard sterile  fashion. An approximately 10-  cm incision was made from the tip of the coracoid process to the center  point of the humerus at the level of the axilla. Dissection was carried  down through subcutaneous tissues to the level of the cephalic vein  which was taken laterally with the deltoid. The pectoralis major was  retracted medially. The subdeltoid space was developed and the lateral  edge of the conjoined tendon was identified. The undersurface of  conjoined tendon was palpated and the musculocutaneous nerve was not in  the field. Retractor was placed underneath the conjoined and second  retractor was placed lateral into the deltoid. The circumflex humeral  artery and vessels were identified and clamped and coagulated. The  biceps tendon was absent.  The subscapularis was chronically torn.  The  joint was then gently externally rotated while the capsule was released  from the humeral neck around to just beyond the 6 o'clock position. At  this point, the joint was dislocated and the humeral head was presented  into the wound. The excessive osteophyte formation was removed with a  large rongeur.  The cutting guide was used to make the appropriate  head cut and the head was saved for potentially bone grafting.  The glenoid was exposed with the arm in an  abducted extended position. The anterior and posterior labrum were  completely excised and the capsule was released circumferentially to  allow for exposure of the glenoid for preparation. The 2.5 mm drill was  placed using the guide in 5-10 inferior angulation  and the tap was then advanced in the same hole. Small and large reamers were then used. The tap was then removed and the Metaglene was then screwed in with excellent purchase.  The peripheral guide was then used to drilled measured and filled peripheral locking screws. The size 32-4 glenosphere was then impacted on the [facility] taper and the central screw was placed. The humerus was then  again exposed and the diaphyseal reamers were used followed by the metaphyseal reamers. The final broach was left in place in the proximal trial was placed. The joint was reduced and with this implant it was felt that soft tissue tensioning was appropriate with excellent stability and excellent range of motion. Therefore, final humeral stem was placed press-fit.  And then the trial polyethylene inserts were tested again and the above implant was felt to be the most appropriate for final insertion. The joint was reduced taken through full range of motion and felt to be stable. Soft tissue tension was appropriate.  The joint was then copiously irrigated with pulse  lavage and the wound was then closed. The subscapularis was not repaired.  Skin was closed with 2-0 Vicryl in a deep dermal layer and 4-0  Monocryl for skin closure. Steri-Strips were applied. Sterile  dressings were then applied as well as a sling. The patient was allowed  to awaken from general anesthesia, transferred to stretcher, and taken  to recovery room in stable condition.   POSTOPERATIVE PLAN: The patient will be observed postoperatively in the recovery room.  If her pain is well controlled with her regional anesthesia and she is hemodynamically stable she can be discharged home today with family.

## 2020-09-19 NOTE — Transfer of Care (Signed)
Immediate Anesthesia Transfer of Care Note  Patient: Toni Gutierrez  Procedure(s) Performed: REVERSE SHOULDER ARTHROPLASTY (Left: Shoulder)  Patient Location: PACU  Anesthesia Type:GA combined with regional for post-op pain  Level of Consciousness: awake, alert  and patient cooperative  Airway & Oxygen Therapy: Patient Spontanous Breathing and Patient connected to face mask oxygen  Post-op Assessment: Report given to RN and Post -op Vital signs reviewed and stable  Post vital signs: Reviewed and stable  Last Vitals:  Vitals Value Taken Time  BP 160/88 09/19/20 1343  Temp    Pulse 88 09/19/20 1345  Resp 17 09/19/20 1345  SpO2 100 % 09/19/20 1345  Vitals shown include unvalidated device data.  Last Pain:  Vitals:   09/19/20 1120  TempSrc:   PainSc: 0-No pain         Complications: No notable events documented.

## 2020-09-19 NOTE — Anesthesia Postprocedure Evaluation (Signed)
Anesthesia Post Note  Patient: Aveanna Orrick  Procedure(s) Performed: REVERSE SHOULDER ARTHROPLASTY (Left: Shoulder)     Patient location during evaluation: PACU Anesthesia Type: General Level of consciousness: awake and alert and oriented Pain management: pain level controlled Vital Signs Assessment: post-procedure vital signs reviewed and stable Respiratory status: spontaneous breathing, nonlabored ventilation and respiratory function stable Cardiovascular status: blood pressure returned to baseline and stable Postop Assessment: no apparent nausea or vomiting Anesthetic complications: no   No notable events documented.  Last Vitals:  Vitals:   09/19/20 1415 09/19/20 1430  BP: (!) 167/85 (!) 165/88  Pulse: 69 66  Resp: 12 12  Temp:    SpO2: 100% 94%    Last Pain:  Vitals:   09/19/20 1430  TempSrc:   PainSc: 8                  Keeana Pieratt A.

## 2020-09-19 NOTE — Anesthesia Procedure Notes (Signed)
Procedure Name: Intubation Date/Time: 09/19/2020 12:26 PM Performed by: West Pugh, CRNA Pre-anesthesia Checklist: Patient identified, Emergency Drugs available, Suction available, Patient being monitored and Timeout performed Patient Re-evaluated:Patient Re-evaluated prior to induction Oxygen Delivery Method: Circle system utilized Preoxygenation: Pre-oxygenation with 100% oxygen Induction Type: IV induction Ventilation: Mask ventilation without difficulty Laryngoscope Size: Miller and 2 Grade View: Grade I Tube type: Oral Airway Equipment and Method: Stylet Placement Confirmation: ETT inserted through vocal cords under direct vision, positive ETCO2, CO2 detector and breath sounds checked- equal and bilateral Secured at: 21 cm Tube secured with: Tape Dental Injury: Teeth and Oropharynx as per pre-operative assessment  Comments: Dellie Catholic, CRNA performed procedure

## 2020-09-19 NOTE — H&P (Signed)
Toni Gutierrez is an 60 y.o. female.   Chief Complaint: Left shoulder pain and dysfunction HPI: Endstage L shoulder arthritis with significant pain and dysfunction, failed conservative measures.  Pain interferes with sleep and quality of life.   Past Medical History:  Diagnosis Date   Alcohol abuse    No alcohol intake since 2001   Allergic rhinitis    Anemia    Arthritis    "back, hands" (01/06/2018)   Carpal tunnel syndrome, right    Chronic lower back pain    MRI of cervical, lumbar spine (all obtained by Dr. Ronnie Derby in 2006)- showing mild DDD with bulges but without disc herniation, at multiple levels; mild facet joint dz, primarily at L3-4 and L4-5 bilaterally   GERD (gastroesophageal reflux disease)    Heartburn    History of COVID-19 05/2019   History of hiatal hernia    history of   Hyperlipidemia    Hypertension    Left knee pain    s/p ACL reconstruction by Dr. Ronnie Derby   Shoulder pain, bilateral    MRI right shoulder showing tear of infraspinatus tendon with bursal surface fraying of the infraspinatus    Past Surgical History:  Procedure Laterality Date   ANTERIOR CRUCIATE LIGAMENT REPAIR Left    By Dr. Terrilyn Saver RELEASE Right 02/08/2019   Procedure: CARPAL TUNNEL RELEASE;  Surgeon: Latanya Maudlin, MD;  Location: [place];  Service: Orthopedics;  Laterality: Right;   CESAREAN SECTION  1986   CHOLECYSTECTOMY N/A 06/25/2020   Procedure: LAPAROSCOPIC CHOLECYSTECTOMY WITH POSSIBLE INTRAOPERATIVE CHOLANGIOGRAM;  Surgeon: Greer Pickerel, MD;  Location: [facility];  Service: General;  Laterality: N/A;   ESOPHAGOGASTRODUODENOSCOPY N/A 07/11/2019   Procedure: UPPER ESOPHAGOGASTRODUODENOSCOPY (EGD);  Surgeon: Greer Pickerel, MD;  Location: [facility];  Service: General;  Laterality: N/A;   HIATAL HERNIA REPAIR N/A 07/11/2019   Procedure: LAPAROSCOPIC REPAIR OF PARAESOPHAGEAL HERNIA WITH TOUPET FUNDOPLICATION AND GASTROPEXY;  Surgeon: Greer Pickerel, MD;   Location: [facility];  Service: General;  Laterality: N/A;   SHOULDER ARTHROSCOPY W/ ROTATOR CUFF REPAIR Right    TUBAL LIGATION  1989    Family History  Problem Relation Age of Onset   Stroke Mother    Colon cancer Neg Hx    Esophageal cancer Neg Hx    Rectal cancer Neg Hx    Stomach cancer Neg Hx    Social History:  reports that she has never smoked. She has never used smokeless tobacco. She reports previous alcohol use of about 1.0 standard drink of alcohol per week. She reports previous drug use.  Allergies: No Known Allergies  Medications Prior to Admission  Medication Sig Dispense Refill   atorvastatin (LIPITOR) 40 MG tablet Take 40 mg by mouth at bedtime.     cholecalciferol (VITAMIN D3) 25 MCG (1000 UNIT) tablet Take 1,000 Units by mouth daily.     CRANBERRY-VITAMIN C PO Take 1 capsule by mouth daily.     docusate sodium (COLACE) 100 MG capsule Take 100 mg by mouth daily.     hydrOXYzine (ATARAX/VISTARIL) 50 MG tablet Take 50-100 mg by mouth at bedtime.     losartan (COZAAR) 50 MG tablet Take 50 mg by mouth daily.     meloxicam (MOBIC) 15 MG tablet Take 15 mg by mouth daily as needed for pain.     Multiple Vitamins-Minerals (MULTIVITAMIN WITH MINERALS) tablet Take 1 tablet by mouth daily.     omeprazole (PRILOSEC) 20 MG capsule Take 2 capsules (  40 mg total) by mouth daily. (Patient taking differently: Take 20 mg by mouth daily.) 60 capsule 0   OVER THE COUNTER MEDICATION Take 1 capsule by mouth daily. Immune Health     oxyCODONE-acetaminophen (PERCOCET) 7.5-325 MG tablet Take 1 tablet by mouth every 6 (six) hours as needed for severe pain.     pregabalin (LYRICA) 75 MG capsule Take 75 mg by mouth 2 (two) times daily.     thiamine (VITAMIN B-1) 100 MG tablet Take 100 mg by mouth daily.     tiZANidine (ZANAFLEX) 2 MG tablet Take 2 mg by mouth every 8 (eight) hours as needed for muscle spasms.     traZODone (DESYREL) 50 MG tablet Take 50 mg by mouth at bedtime.      No results  found for this or any previous visit (from the past 48 hour(s)). No results found.  Review of Systems  All other systems reviewed and are negative.  Blood pressure (!) 153/92, pulse 81, temperature 98.2 F (36.8 C), temperature source Oral, resp. rate (!) 21, height '4\' 11"'$  (1.499 m), weight 56.2 kg, last menstrual period 12/25/2010, SpO2 100 %. Physical Exam HENT:     Head: Atraumatic.  Eyes:     Extraocular Movements: Extraocular movements intact.  Cardiovascular:     Pulses: Normal pulses.  Pulmonary:     Effort: Pulmonary effort is normal.  Musculoskeletal:     Comments: L shoulder pain with limited ROM. NVID.  Neurological:     Mental Status: She is alert.     Assessment/Plan Left shoulder end-stage osteoarthritis Plan L TSA Risks / benefits of surgery discussed Consent on chart  NPO for OR Preop antibiotics   Isabella Stalling, MD 09/19/2020, 11:24 AM

## 2020-09-19 NOTE — Discharge Instructions (Addendum)
Discharge Instructions after Reverse Total Shoulder Arthroplasty   . A sling has been provided for you. You are to wear this at all times (except for bathing and dressing), until your first post operative visit with Dr. Chandler. Please also wear while sleeping at night. While you bath and dress, let the arm/elbow extend straight down to stretch your elbow. Wiggle your fingers and pump your first while your in the sling to prevent hand swelling. . Use ice on the shoulder intermittently over the first 48 hours after surgery. Continue to use ice or and ice machine as needed after 48 hours for pain control/swelling.  . Pain medicine has been prescribed for you.  . Use your medicine liberally over the first 48 hours, and then you can begin to taper your use. You may take Extra Strength Tylenol or Tylenol only in place of the pain pills. DO NOT take ANY nonsteroidal anti-inflammatory pain medications: Advil, Motrin, Ibuprofen, Aleve, Naproxen or Naprosyn.  . Take one aspirin a day for 2 weeks after surgery, unless you have an aspirin sensitivity/allergy or asthma.  . Leave your dressing on until your first follow up visit.  You may shower with the dressing.  Hold your arm as if you still have your sling on while you shower. . Simply allow the water to wash over the site and then pat dry. Make sure your axilla (armpit) is completely dry after showering.    Please call [phone] during normal business hours or [phone] after hours for any problems. Including the following:  - excessive redness of the incisions - drainage for more than 4 days - fever of more than 101.5 F  *Please note that pain medications will not be refilled after hours or on weekends.     

## 2020-09-19 NOTE — Anesthesia Procedure Notes (Signed)
Anesthesia Regional Block: Interscalene brachial plexus block   Pre-Anesthetic Checklist: , timeout performed,  Correct Patient, Correct Site, Correct Laterality,  Correct Procedure, Correct Position, site marked,  Risks and benefits discussed,  Surgical consent,  Pre-op evaluation,  At surgeon's request and post-op pain management  Laterality: Left  Prep: chloraprep       Needles:  Injection technique: Single-shot  Needle Type: Echogenic Stimulator Needle     Needle Length: 10cm  Needle Gauge: 21   Needle insertion depth: 6 cm   Additional Needles:     Motor weakness within 10 minutes.  Narrative:  Start time: 09/19/2020 11:23 AM End time: 09/19/2020 11:28 AM Injection made incrementally with aspirations every 5 mL.  Performed by: Personally  Anesthesiologist: Josephine Igo, MD  Additional Notes: Timeout performed. Patient sedated. Relevant anatomy ID'd using [place]. Incremental 2-71m injection of LA with frequent aspiration. Patient tolerated procedure well.     Left Interscalene Block

## 2020-09-19 NOTE — Anesthesia Preprocedure Evaluation (Addendum)
Anesthesia Evaluation  Patient identified by MRN, date of birth, ID band Patient awake    Reviewed: Allergy & Precautions, NPO status , Patient's Chart, lab work & pertinent test results, reviewed documented beta blocker date and time   Airway Mallampati: II  TM Distance: >3 FB Neck ROM: Full    Dental no notable dental hx. (+) Dental Advisory Given   Pulmonary neg pulmonary ROS,    Pulmonary exam normal breath sounds clear to auscultation       Cardiovascular hypertension, Pt. on medications Normal cardiovascular exam Rhythm:Regular Rate:Normal  EKG 06/21/20 Sinus Bradycardia, non specific ST-T wave shanges  Echo 01/07/18 Left ventricle: The cavity size was normal. Wall thickness was increased in a pattern of mild LVH. Systolic function was vigorous. The estimated ejection fraction was in the range of 65% to 70%. Wall motion was normal; there were no regional wall motion abnormalities. Doppler parameters are consistent with abnormal left ventricular relaxation (grade 1 diastolicdysfunction).    Neuro/Psych PSYCHIATRIC DISORDERS Left upper extremity mononeuropathy  Neuromuscular disease    GI/Hepatic hiatal hernia, GERD  Medicated,(+)     substance abuse  alcohol use,   Endo/Other  Hyperlipidemia  Renal/GU negative Renal ROS  negative genitourinary   Musculoskeletal  (+) Arthritis , Osteoarthritis,  Left shoulder rotator cuff tear   Abdominal   Peds  Hematology  (+) anemia ,   Anesthesia Other Findings   Reproductive/Obstetrics                            Anesthesia Physical Anesthesia Plan  ASA: 2  Anesthesia Plan: General   Post-op Pain Management:  Regional for Post-op pain   Induction: Intravenous  PONV Risk Score and Plan: 4 or greater and Treatment may vary due to age or medical condition, Ondansetron and Dexamethasone  Airway Management Planned: Oral ETT  Additional  Equipment:   Intra-op Plan:   Post-operative Plan: Extubation in OR  Informed Consent: I have reviewed the patients History and Physical, chart, labs and discussed the procedure including the risks, benefits and alternatives for the proposed anesthesia with the patient or authorized representative who has indicated his/her understanding and acceptance.     Dental advisory given  Plan Discussed with: CRNA and Anesthesiologist  Anesthesia Plan Comments:         Anesthesia Quick Evaluation

## 2020-09-20 ENCOUNTER — Encounter (HOSPITAL_COMMUNITY): Payer: Self-pay | Admitting: Orthopedic Surgery

## 2020-12-12 IMAGING — RF DG UGI W SINGLE CM
7 of 10 series · 12 of 20 positions shown · non-contrast
Comparison: CT abdomen/pelvis 03/06/2018.

CLINICAL DATA: Provided history: Postop hiatal hernia repair.
Additional history obtained from electronic medical record:
Laparoscopic repair of paraesophageal hiatal hernia with Zamzam
fundoplication and gastropexy.

EXAM:
UPPER GI SERIES WITH KUB
TECHNIQUE: After obtaining a scout radiograph a routine upper GI series was
performed using water-soluble iodinated contrast.
FLUOROSCOPY TIME:  Fluoroscopy Time:  1 minutes.
Radiation Exposure Index (if provided by the fluoroscopic device):
29.3 mGy
Number of Acquired Spot Images: 3

[Series 1: t abdomen supine · 0.15mm/px · 1 of 1 slices shown]
[im 1/1]
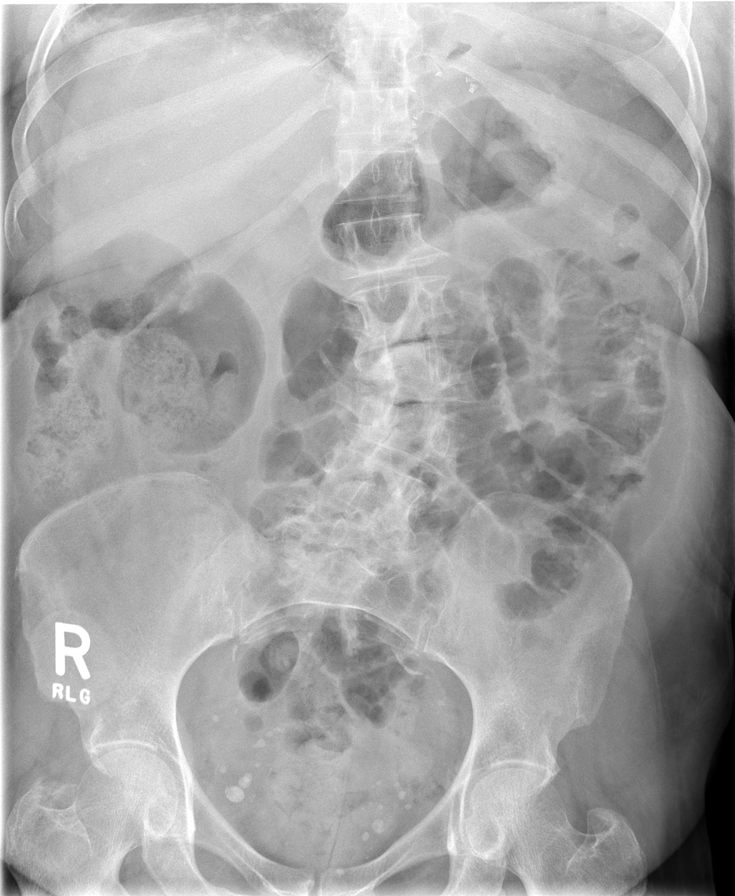

[Series 2: cp_standard · 0.37mm/px · 2 of 99 frames shown (1 of 4)]
[frame 50/99]
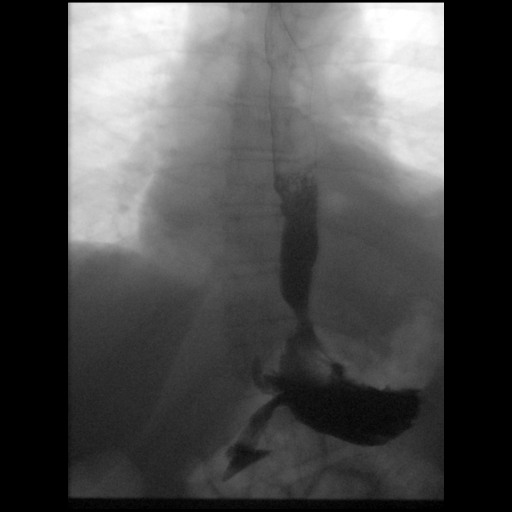
[frame 85/99]
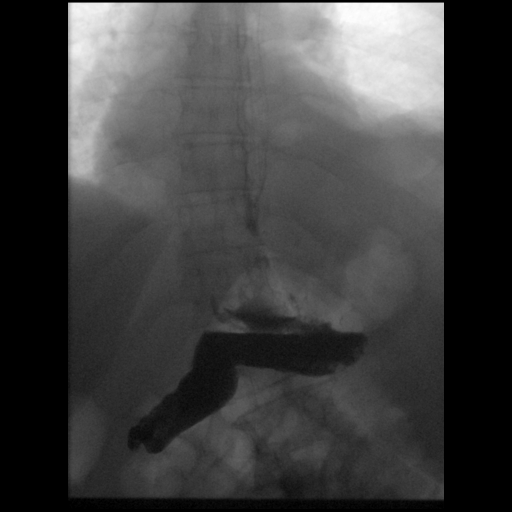

[Series 3: fluoro_barium 2fps_bw · 0.18mm/px · 1 of 1 slices shown (1 of 2)]
[im 1/1]
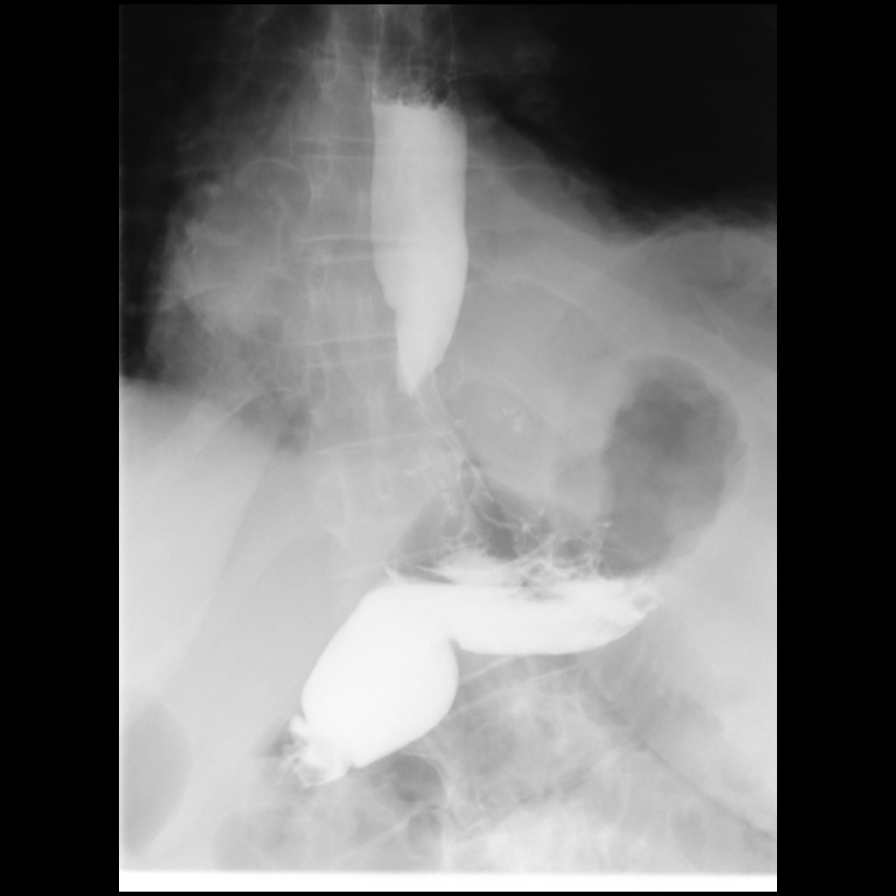

[Series 5: cp_standard · 0.37mm/px · 3 of 51 frames shown (2 of 4)]
[frame 8/51]
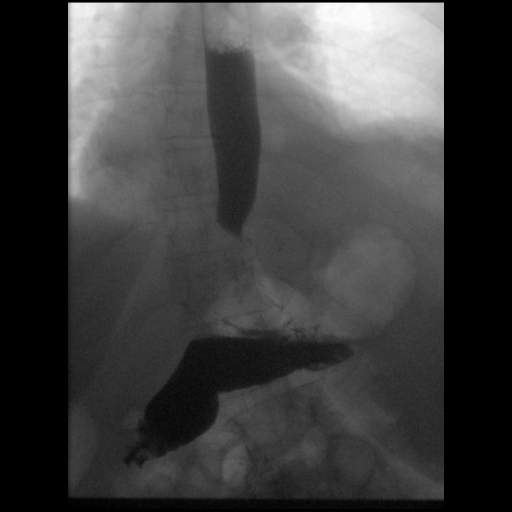
[frame 44/51]
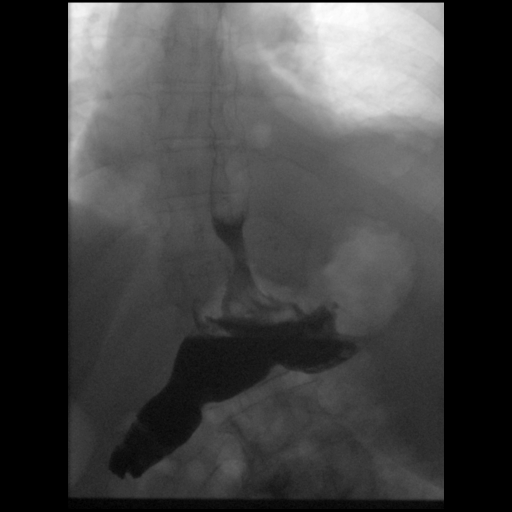
[frame 51/51]
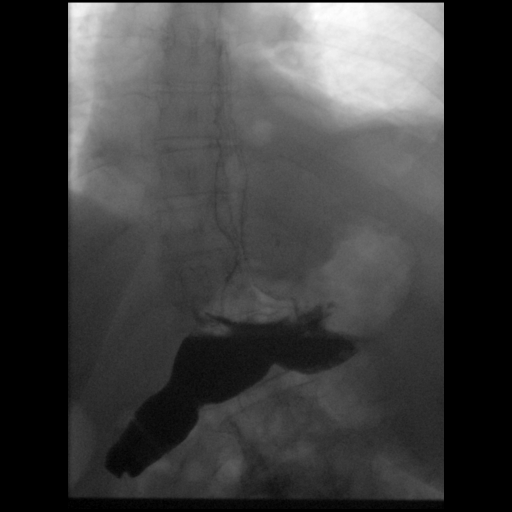

[Series 7: cp_standard · 0.37mm/px · 3 of 74 frames shown (3 of 4)]
[frame 1/74]
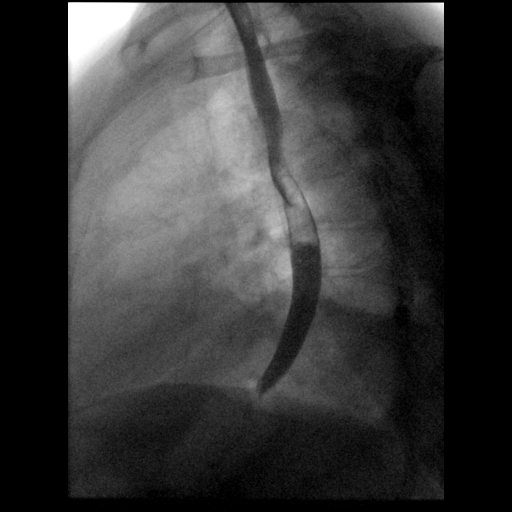
[frame 38/74]
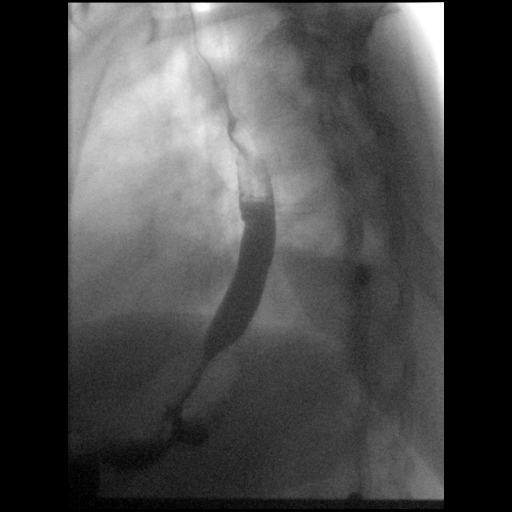
[frame 63/74]
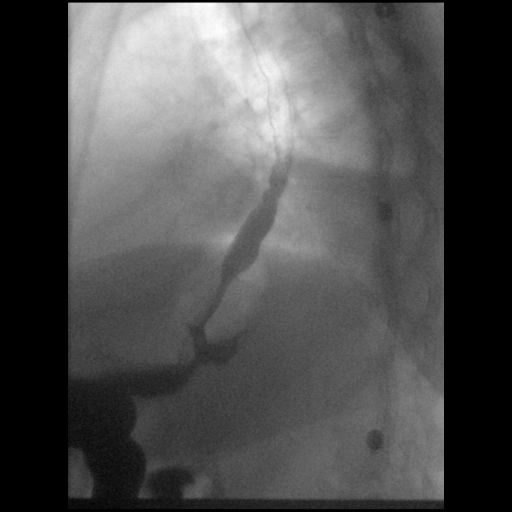

[Series 8: fluoro_barium 2fps_bw · 0.18mm/px · 1 of 2 frames shown (2 of 2)]
[frame 2/2]
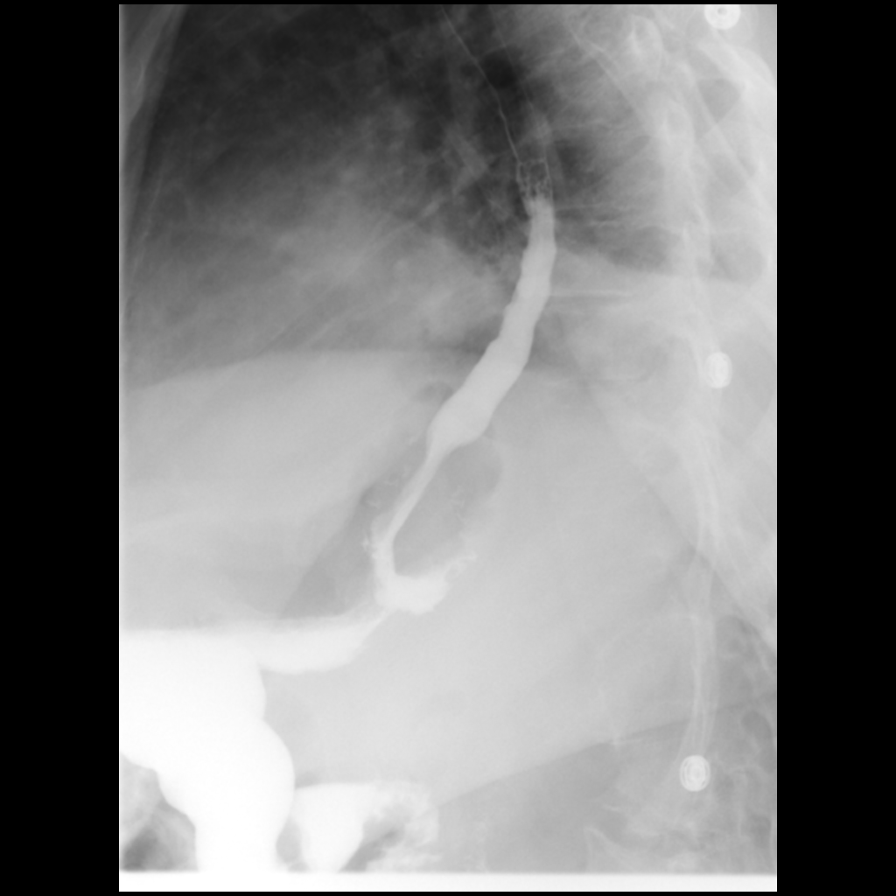

[Series 10: cp_standard · 0.18mm/px · 1 of 1 slices shown (4 of 4)]
[im 1/1]
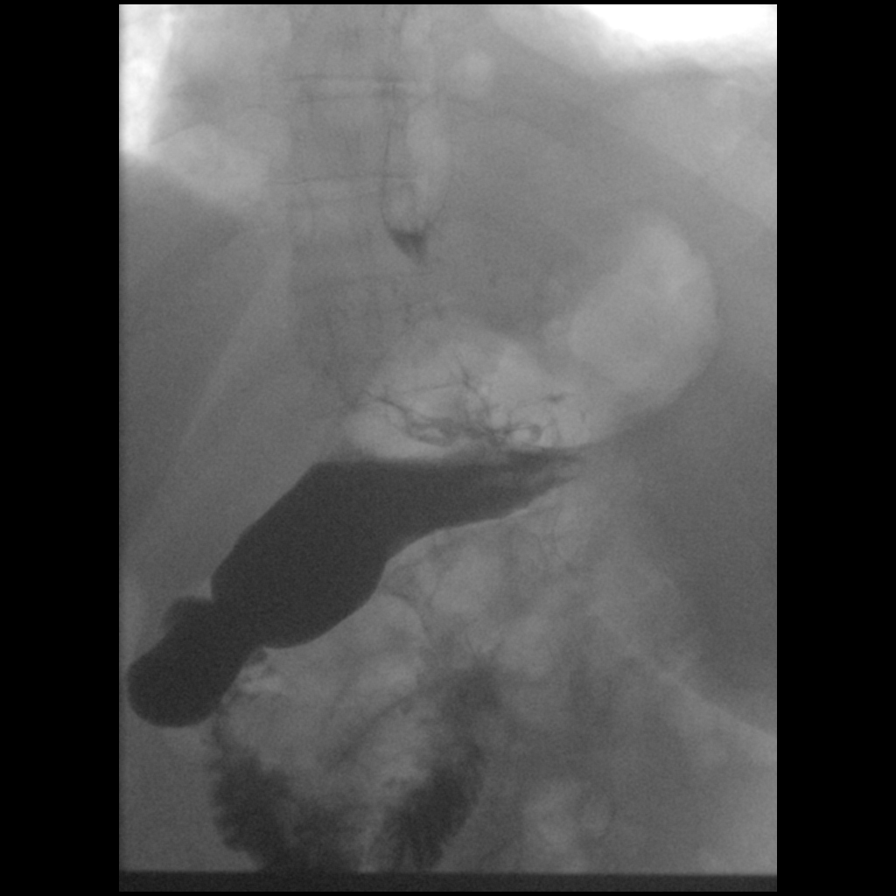

[12 of 20 positions shown; findings below may reference images not displayed]

FINDINGS: A scout radiograph of the abdomen demonstrates a nonobstructive
bowel gas pattern. There is mild gaseous distention of the stomach.
Mild colonic stool burden within the right colon. Prominent lumbar
levocurvature with lumbar spondylosis.

There is smooth narrowing of the distal esophagus consistent with
the history of fundoplication. The esophagus is otherwise normal in
caliber and smooth in contour. Normal esophageal motility was
observed. No evidence of residual hiatal hernia. No gastroesophageal
reflux was observed. No extraluminal contrast is identified to
suggest leak.

There is free flow of contrast from the stomach into the duodenal
bulb and duodenal sweep.
IMPRESSION: Water-soluble upper GI series performed status post paraesophageal
hiatal hernia repair and fundoplication.

No residual hiatal hernia. No extraluminal contrast is identified to
suggest leak. There is free flow of contrast from the distal
esophagus into the stomach.

No gastroesophageal reflux.

## 2021-01-19 ENCOUNTER — Other Ambulatory Visit: Payer: Self-pay

## 2021-01-19 ENCOUNTER — Ambulatory Visit (HOSPITAL_COMMUNITY)
Admission: EM | Admit: 2021-01-19 | Discharge: 2021-01-19 | Disposition: A | Discharge status: Home / Self Care | Payer: Medicare Other | Attending: Urgent Care | Admitting: Urgent Care

## 2021-01-19 ENCOUNTER — Encounter (HOSPITAL_COMMUNITY): Payer: Self-pay

## 2021-01-19 DIAGNOSIS — K529 Noninfective gastroenteritis and colitis, unspecified: Secondary | ICD-10-CM

## 2021-01-19 DIAGNOSIS — M419 Scoliosis, unspecified: Secondary | ICD-10-CM

## 2021-01-19 DIAGNOSIS — R197 Diarrhea, unspecified: Secondary | ICD-10-CM

## 2021-01-19 DIAGNOSIS — R1084 Generalized abdominal pain: Secondary | ICD-10-CM

## 2021-01-19 DIAGNOSIS — M5136 Other intervertebral disc degeneration, lumbar region: Secondary | ICD-10-CM

## 2021-01-19 MED ORDER — LOPERAMIDE HCL 2 MG PO CAPS: 2.0000 mg | ORAL_CAPSULE | Freq: Two times a day (BID) | ORAL | 0 refills | Status: AC | PRN

## 2021-01-19 MED ORDER — PREDNISONE 10 MG PO TABS: 30.0000 mg | ORAL_TABLET | Freq: Every day | ORAL | 0 refills | Status: DC

## 2021-01-19 MED ORDER — ONDANSETRON 8 MG PO TBDP: 8.0000 mg | ORAL_TABLET | Freq: Three times a day (TID) | ORAL | 0 refills | Status: DC | PRN

## 2021-01-19 NOTE — ED Provider Notes (Addendum)
[place]   MRN: [id number] DOB: 02/27/1960  Subjective:   Toni Gutierrez is a 60 y.o. female presenting for 2-day history of acute on chronic low back pain, slight weakness of her lower legs.  Has a history of moderate to severe degenerative disc disease, scoliosis.  Has also had 4 to 5 days of persistent diarrhea, abdominal cramping.  Has been trying to stay very well-hydrated by anything she eats goes right through her and has diarrhea.  He has not used medications for this.  No recent antibiotic use, long distance travel outside the country, bloody stools, history of GI issues.  No vomiting.  No cough, chest pain, shortness of breath, body aches.  No recent falls, trauma, hematuria, changes to urinary habits.  Patient is not a diabetic.  She is compliant with her blood pressure medications but admits that she can do better with her diet.  She has a follow-up requirement coming up with her regular doctor this week.  No current facility-administered medications for this encounter.  Current Outpatient Medications:    atorvastatin (LIPITOR) 40 MG tablet, Take 40 mg by mouth at bedtime., Disp: , Rfl:    cholecalciferol (VITAMIN D3) 25 MCG (1000 UNIT) tablet, Take 1,000 Units by mouth daily., Disp: , Rfl:    CRANBERRY-VITAMIN C PO, Take 1 capsule by mouth daily., Disp: , Rfl:    docusate sodium (COLACE) 100 MG capsule, Take 100 mg by mouth daily., Disp: , Rfl:    hydrOXYzine (ATARAX/VISTARIL) 50 MG tablet, Take 50-100 mg by mouth at bedtime., Disp: , Rfl:    losartan (COZAAR) 50 MG tablet, Take 50 mg by mouth daily., Disp: , Rfl:    Multiple Vitamins-Minerals (MULTIVITAMIN WITH MINERALS) tablet, Take 1 tablet by mouth daily., Disp: , Rfl:    omeprazole (PRILOSEC) 20 MG capsule, Take 2 capsules (40 mg total) by mouth daily. (Patient taking differently: Take 20 mg by mouth daily.), Disp: 60 capsule, Rfl: 0   OVER THE COUNTER MEDICATION, Take 1 capsule by mouth daily.  Immune Health, Disp: , Rfl:    pregabalin (LYRICA) 75 MG capsule, Take 75 mg by mouth 2 (two) times daily., Disp: , Rfl:    thiamine (VITAMIN B-1) 100 MG tablet, Take 100 mg by mouth daily., Disp: , Rfl:    tiZANidine (ZANAFLEX) 2 MG tablet, Take 2 mg by mouth every 8 (eight) hours as needed for muscle spasms., Disp: , Rfl:    traZODone (DESYREL) 50 MG tablet, Take 50 mg by mouth at bedtime., Disp: , Rfl:    No Known Allergies  Past Medical History:  Diagnosis Date   Alcohol abuse    No alcohol intake since 2001   Allergic rhinitis    Anemia    Arthritis    "back, hands" (01/06/2018)   Carpal tunnel syndrome, right    Chronic lower back pain    MRI of cervical, lumbar spine (all obtained by Dr. Ronnie Derby in 2006)- showing mild DDD with bulges but without disc herniation, at multiple levels; mild facet joint dz, primarily at L3-4 and L4-5 bilaterally   GERD (gastroesophageal reflux disease)    Heartburn    History of COVID-19 05/2019   History of hiatal hernia    history of   Hyperlipidemia    Hypertension    Left knee pain    s/p ACL reconstruction by Dr. Ronnie Derby   Shoulder pain, bilateral    MRI right shoulder showing tear of infraspinatus tendon with bursal surface  fraying of the infraspinatus     Past Surgical History:  Procedure Laterality Date   ANTERIOR CRUCIATE LIGAMENT REPAIR Left    By Dr. Terrilyn Saver RELEASE Right 02/08/2019   Procedure: CARPAL TUNNEL RELEASE;  Surgeon: Latanya Maudlin, MD;  Location: [place];  Service: Orthopedics;  Laterality: Right;   CESAREAN SECTION  1986   CHOLECYSTECTOMY N/A 06/25/2020   Procedure: LAPAROSCOPIC CHOLECYSTECTOMY WITH POSSIBLE INTRAOPERATIVE CHOLANGIOGRAM;  Surgeon: Greer Pickerel, MD;  Location: [facility];  Service: General;  Laterality: N/A;   ESOPHAGOGASTRODUODENOSCOPY N/A 07/11/2019   Procedure: UPPER ESOPHAGOGASTRODUODENOSCOPY (EGD);  Surgeon: Greer Pickerel, MD;  Location: [facility];  Service: General;   Laterality: N/A;   HIATAL HERNIA REPAIR N/A 07/11/2019   Procedure: LAPAROSCOPIC REPAIR OF PARAESOPHAGEAL HERNIA WITH TOUPET FUNDOPLICATION AND GASTROPEXY;  Surgeon: Greer Pickerel, MD;  Location: [facility];  Service: General;  Laterality: N/A;   REVERSE SHOULDER ARTHROPLASTY Left 09/19/2020   Procedure: REVERSE SHOULDER ARTHROPLASTY;  Surgeon: Tania Ade, MD;  Location: [facility];  Service: Orthopedics;  Laterality: Left;   SHOULDER ARTHROSCOPY W/ ROTATOR CUFF REPAIR Right    TUBAL LIGATION  1989    Family History  Problem Relation Age of Onset   Stroke Mother    Colon cancer Neg Hx    Esophageal cancer Neg Hx    Rectal cancer Neg Hx    Stomach cancer Neg Hx     Social History   Tobacco Use   Smoking status: Never   Smokeless tobacco: Never  Vaping Use   Vaping Use: Never used  Substance Use Topics   Alcohol use: Not Currently    Alcohol/week: 1.0 standard drink    Types: 1 Glasses of wine per week    Comment: rare   Drug use: Not Currently    ROS   Objective:   Vitals: BP (!) 165/102 (BP Location: Left Arm)   Pulse 72   Temp 98.4 F (36.9 C) (Oral)   LMP 12/25/2010   SpO2 100%   BP Readings from Last 3 Encounters:  01/19/21 (!) 165/102  09/19/20 (!) 170/86  09/16/20 (!) 161/93   Physical Exam Constitutional:      General: She is not in acute distress.    Appearance: Normal appearance. She is well-developed. She is not ill-appearing, toxic-appearing or diaphoretic.  HENT:     Head: Normocephalic and atraumatic.     Nose: Nose normal.     Mouth/Throat:     Mouth: Mucous membranes are moist.     Pharynx: Oropharynx is clear.  Eyes:     General: No scleral icterus.       Right eye: No discharge.        Left eye: No discharge.     Extraocular Movements: Extraocular movements intact.     Conjunctiva/sclera: Conjunctivae normal.     Pupils: Pupils are equal, round, and reactive to light.  Cardiovascular:     Rate and Rhythm: Normal rate and regular rhythm.      Pulses: Normal pulses.     Heart sounds: Normal heart sounds. No murmur heard.   No friction rub. No gallop.  Pulmonary:     Effort: Pulmonary effort is normal. No respiratory distress.     Breath sounds: Normal breath sounds. No stridor. No wheezing, rhonchi or rales.  Abdominal:     General: Bowel sounds are normal. There is no distension.     Palpations: Abdomen is soft. There is no mass.  Tenderness: There is no abdominal tenderness. There is no right CVA tenderness, left CVA tenderness, guarding or rebound.  Musculoskeletal:     Lumbar back: Spasms and tenderness present. No swelling, edema, deformity, signs of trauma, lacerations or bony tenderness. Decreased range of motion. Negative right straight leg raise test and negative left straight leg raise test. No scoliosis.     Comments: Ambulates using a walker.  Skin:    General: Skin is warm and dry.     Findings: No rash.  Neurological:     General: No focal deficit present.     Mental Status: She is alert and oriented to person, place, and time.     Motor: Weakness (not new, lower extremities, symmetric strength) present.     Coordination: Coordination normal.     Gait: Gait normal.     Deep Tendon Reflexes: Reflexes normal.  Psychiatric:        Mood and Affect: Mood normal.        Behavior: Behavior normal.        Thought Content: Thought content normal.        Judgment: Judgment normal.    Assessment and Plan :   PDMP not reviewed this encounter.  1. Acute colitis   2. Generalized abdominal pain   3. Diarrhea, unspecified type   4. Degenerative disc disease, lumbar   5. Scoliosis of lumbar spine, unspecified scoliosis type    Will manage for suspected viral colitis with supportive care.  Recommended patient hydrate well, eat light meals and maintain electrolytes.  Will use Zofran and Imodium for nausea, vomiting and diarrhea. Start prednisone course for her acute on chronic back pain secondary to her  degenerative disc disease, scoliosis.  Counseled patient on potential for adverse effects with medications prescribed/recommended today, ER and return-to-clinic precautions discussed, patient verbalized understanding.     Jaynee Eagles, PA-C 01/19/21 1340

## 2021-01-19 NOTE — Discharge Instructions (Addendum)
Make sure you push fluids drinking mostly water but mix it with Gatorade.  Try to eat light meals including soups, broths and soft foods, fruits.  You may use Zofran for your nausea and vomiting once every 8 hours.  Imodium can help with diarrhea but use this carefully limiting it to 1-2 times per day only if you are having a lot of diarrhea.  Please return to the clinic if symptoms worsen or you start having severe abdominal pain not helped by taking Tylenol or start having bloody stools or blood in the vomit.  We will provide you with a steroid course for a flare up of your back, arthritis and scoliosis.

## 2021-01-19 NOTE — ED Triage Notes (Signed)
Pt presents to urgent care for back pain x 2 days.

## 2021-05-19 ENCOUNTER — Other Ambulatory Visit: Payer: Self-pay

## 2021-05-19 ENCOUNTER — Encounter: Payer: Self-pay | Admitting: Emergency Medicine

## 2021-05-19 ENCOUNTER — Ambulatory Visit
Admission: EM | Admit: 2021-05-19 | Discharge: 2021-05-19 | Disposition: A | Discharge status: Home / Self Care | Payer: Medicare Other

## 2021-05-19 ENCOUNTER — Encounter (HOSPITAL_BASED_OUTPATIENT_CLINIC_OR_DEPARTMENT_OTHER): Payer: Self-pay | Admitting: Emergency Medicine

## 2021-05-19 ENCOUNTER — Emergency Department (HOSPITAL_BASED_OUTPATIENT_CLINIC_OR_DEPARTMENT_OTHER): Payer: Medicare Other

## 2021-05-19 ENCOUNTER — Emergency Department (HOSPITAL_BASED_OUTPATIENT_CLINIC_OR_DEPARTMENT_OTHER)
Admission: EM | Admit: 2021-05-19 | Discharge: 2021-05-19 | Disposition: A | Discharge status: Home / Self Care | Payer: Medicare Other | Attending: Emergency Medicine | Admitting: Emergency Medicine

## 2021-05-19 DIAGNOSIS — I10 Essential (primary) hypertension: Secondary | ICD-10-CM | POA: Diagnosis not present

## 2021-05-19 DIAGNOSIS — E876 Hypokalemia: Secondary | ICD-10-CM | POA: Insufficient documentation

## 2021-05-19 DIAGNOSIS — K5100 Ulcerative (chronic) pancolitis without complications: Secondary | ICD-10-CM | POA: Diagnosis not present

## 2021-05-19 DIAGNOSIS — Z79899 Other long term (current) drug therapy: Secondary | ICD-10-CM | POA: Diagnosis not present

## 2021-05-19 DIAGNOSIS — R1013 Epigastric pain: Secondary | ICD-10-CM

## 2021-05-19 DIAGNOSIS — R1084 Generalized abdominal pain: Secondary | ICD-10-CM | POA: Diagnosis present

## 2021-05-19 LAB — COMPREHENSIVE METABOLIC PANEL
ALT: 21 U/L (ref 0–44)
AST: 62 U/L — ABNORMAL HIGH (ref 15–41)
Albumin: 3.6 g/dL (ref 3.5–5.0)
Alkaline Phosphatase: 71 U/L (ref 38–126)
Anion gap: 12 (ref 5–15)
BUN: 5 mg/dL — ABNORMAL LOW (ref 8–23)
CO2: 32 mmol/L (ref 22–32)
Calcium: 7.8 mg/dL — ABNORMAL LOW (ref 8.9–10.3)
Chloride: 98 mmol/L (ref 98–111)
Creatinine, Ser: 0.75 mg/dL (ref 0.44–1.00)
GFR, Estimated: >60 mL/min (ref >60)
Glucose, Bld: 95 mg/dL (ref 70–99)
Potassium: 2.4 mmol/L — CL (ref 3.5–5.1)
Sodium: 142 mmol/L (ref 135–145)
Total Bilirubin: 0.9 mg/dL (ref 0.3–1.2)
Total Protein: 6.2 g/dL — ABNORMAL LOW (ref 6.5–8.1)

## 2021-05-19 LAB — CBC
HCT: 26.8 % — ABNORMAL LOW (ref 36.0–46.0)
Hemoglobin: 9.8 g/dL — ABNORMAL LOW (ref 12.0–15.0)
MCH: 36.3 pg — ABNORMAL HIGH (ref 26.0–34.0)
MCHC: 36.6 g/dL — ABNORMAL HIGH (ref 30.0–36.0)
MCV: 99.3 fL (ref 80.0–100.0)
Platelets: 233 10*3/uL (ref 150–400)
RBC: 2.7 MIL/uL — ABNORMAL LOW (ref 3.87–5.11)
RDW: 17 % — ABNORMAL HIGH (ref 11.5–15.5)
WBC: 6 10*3/uL (ref 4.0–10.5)
nRBC: 0.3 % — ABNORMAL HIGH (ref 0.0–0.2)

## 2021-05-19 LAB — URINALYSIS, ROUTINE W REFLEX MICROSCOPIC
Bilirubin Urine: NEGATIVE
Glucose, UA: NEGATIVE mg/dL
Hgb urine dipstick: NEGATIVE
Ketones, ur: NEGATIVE mg/dL
Leukocytes,Ua: NEGATIVE
Nitrite: NEGATIVE
Protein, ur: 30 mg/dL — AB
Specific Gravity, Urine: >1.046 — ABNORMAL HIGH (ref 1.005–1.030)
pH: 7.5 (ref 5.0–8.0)

## 2021-05-19 LAB — MAGNESIUM
Magnesium: 1.1 mg/dL — ABNORMAL LOW (ref 1.7–2.4)
Magnesium: 2.5 mg/dL — ABNORMAL HIGH (ref 1.7–2.4)

## 2021-05-19 LAB — LIPASE, BLOOD: Lipase: 19 U/L (ref 11–51)

## 2021-05-19 LAB — POTASSIUM
Potassium: 2.8 mmol/L — ABNORMAL LOW (ref 3.5–5.1)
Potassium: 2.6 mmol/L — CL (ref 3.5–5.1)
Potassium: 3.6 mmol/L (ref 3.5–5.1)

## 2021-05-19 MED ORDER — MAGNESIUM SULFATE 2 GM/50ML IV SOLN
2.0000 g | Freq: Once | INTRAVENOUS | Status: AC
  Administered 2021-05-19: 2 g via INTRAVENOUS
  Filled 2021-05-19: qty 50

## 2021-05-19 MED ORDER — POTASSIUM CHLORIDE 10 MEQ/100ML IV SOLN
10.0000 meq | INTRAVENOUS | Status: AC
  Administered 2021-05-19 (×2): 10 meq via INTRAVENOUS
  Filled 2021-05-19 (×2): qty 100

## 2021-05-19 MED ORDER — DICYCLOMINE HCL 20 MG PO TABS: 20.0000 mg | ORAL_TABLET | Freq: Two times a day (BID) | ORAL | 0 refills | Status: DC

## 2021-05-19 MED ORDER — IOHEXOL 350 MG/ML SOLN
100.0000 mL | Freq: Once | INTRAVENOUS | Status: AC | PRN
  Administered 2021-05-19: 65 mL via INTRAVENOUS

## 2021-05-19 MED ORDER — POTASSIUM CHLORIDE CRYS ER 20 MEQ PO TBCR
40.0000 meq | EXTENDED_RELEASE_TABLET | Freq: Once | ORAL | Status: AC
  Administered 2021-05-19: 40 meq via ORAL
  Filled 2021-05-19: qty 2

## 2021-05-19 MED ORDER — SUCRALFATE 1 G PO TABS: 1.0000 g | ORAL_TABLET | Freq: Three times a day (TID) | ORAL | 0 refills | Status: DC

## 2021-05-19 MED ORDER — SODIUM CHLORIDE 0.9 % IV SOLN
INTRAVENOUS | Status: DC | PRN
Start: 2021-05-19 — End: 2021-05-20

## 2021-05-19 MED ORDER — MAGNESIUM OXIDE -MG SUPPLEMENT 400 (240 MG) MG PO TABS
400.0000 mg | ORAL_TABLET | Freq: Once | ORAL | Status: AC
  Administered 2021-05-19: 400 mg via ORAL
  Filled 2021-05-19: qty 1

## 2021-05-19 MED ORDER — LACTATED RINGERS IV BOLUS
1000.0000 mL | Freq: Once | INTRAVENOUS | Status: AC
  Administered 2021-05-19: 1000 mL via INTRAVENOUS

## 2021-05-19 MED ORDER — HYDROMORPHONE HCL 1 MG/ML IJ SOLN
1.0000 mg | Freq: Once | INTRAMUSCULAR | Status: AC
  Administered 2021-05-19: 1 mg via INTRAVENOUS
  Filled 2021-05-19: qty 1

## 2021-05-19 MED ORDER — POTASSIUM CHLORIDE CRYS ER 20 MEQ PO TBCR
40.0000 meq | EXTENDED_RELEASE_TABLET | Freq: Once | ORAL | Status: AC
Start: 2021-05-19 — End: 2021-05-19
  Administered 2021-05-19: 40 meq via ORAL
  Filled 2021-05-19: qty 2

## 2021-05-19 MED ORDER — LOSARTAN POTASSIUM 25 MG PO TABS
50.0000 mg | ORAL_TABLET | Freq: Once | ORAL | Status: AC
  Administered 2021-05-19: 50 mg via ORAL
  Filled 2021-05-19: qty 2

## 2021-05-19 MED ORDER — ONDANSETRON HCL 4 MG/2ML IJ SOLN
4.0000 mg | Freq: Once | INTRAMUSCULAR | Status: AC
  Administered 2021-05-19: 4 mg via INTRAVENOUS
  Filled 2021-05-19: qty 2

## 2021-05-19 MED ORDER — SODIUM CHLORIDE 0.9 % IV SOLN: Freq: Once | INTRAVENOUS | Status: AC

## 2021-05-19 NOTE — ED Provider Notes (Signed)
Patient here today for evaluation of epigastric pain that started last night. She reports pain has improved somewhat but she continues to have nausea and has had some diarrhea as well. PMH significant for multiple GI issues in the past including hiatal hernia, alcoholism, and more. Recommended further evaluation in the ED as I suspect she will need stat labs and imaging. Patient is agreeable to same and husband will drive her by POV.  ?  ?Francene Finders, PA-C ?05/19/21 0945 ? ?

## 2021-05-19 NOTE — Discharge Instructions (Addendum)
It was a pleasure caring for you today in the emergency department. ? ?Please consider cessation of alcohol use ? ?Please follow-up with PCP for repeat potassium and magnesium level in the next 2 to 3 days ? ?Please return to the emergency department for any worsening or worrisome symptoms. ? ?

## 2021-05-19 NOTE — ED Provider Notes (Signed)
?  Provider Note ?MRN:  [id number]  ?Arrival date & time: 05/20/21    ?ED Course and Medical Decision Making  ?Assumed care from [facility] at shift change. ? ?See not from prior team for complete details, in brief: 61 yo female to ED with diarrhea/abd pain. Drinking ETOH regularly last 2 mos, not taking potassium. Feeling better after intervention in the ED. CT with pancolitis. Lytes abnormal. Replaced in ED. ? ?Plan per prior physician PO challenge, BP meds. Check lytes.  ? ?Electrolytes replaced, repeat potassium is within normal limits. ? ?Patient tolerating p.o. intake, overall feels much better.  Vies she refrain from alcohol use.  Follow-up PCP for repeat electrolytes in 2 to 3 days.  Advised bland diet. ? ?No evidence of acute alcohol withdrawal at this time. ? ?The patient improved significantly and was discharged in stable condition. Detailed discussions were had with the patient regarding current findings, and need for close f/u with PCP or on call doctor. The patient has been instructed to return immediately if the symptoms worsen in any way for re-evaluation. Patient verbalized understanding and is in agreement with current care plan. All questions answered prior to discharge. ?  ? ?.Critical Care ?Performed by: Jeanell Sparrow, DO ?Authorized by: Jeanell Sparrow, DO  ? ?Critical care provider statement:  ?  Critical care time (minutes):  37 ?  Critical care time was exclusive of:  Separately billable procedures and treating other patients ?  Critical care was necessary to treat or prevent imminent or life-threatening deterioration of the following conditions: Critical hypokalemia. ?  Critical care was time spent personally by me on the following activities:  Development of treatment plan with patient or surrogate, discussions with consultants, evaluation of patient's response to treatment, examination of patient, ordering and review of laboratory studies, ordering and review of radiographic studies, ordering  and performing treatments and interventions, pulse oximetry, re-evaluation of patient's condition, review of old charts and obtaining history from patient or surrogate ? ?Final Clinical Impressions(s) / ED Diagnoses  ? ?  ICD-10-CM   ?1. Hypokalemia  E87.6   ?  ?2. Pancolitis ([place])  K51.00   ?  ?3. Hypomagnesemia  E83.42   ?  ?  ?ED Discharge Orders   ? ?      Ordered  ?  sucralfate (CARAFATE) 1 g tablet  3 times daily with meals & bedtime       ? 05/19/21 2309  ?  dicyclomine (BENTYL) 20 MG tablet  2 times daily       ? 05/19/21 2309  ? ?  ?  ? ?  ?  ? ? ?Discharge Instructions   ? ?  ?It was a pleasure caring for you today in the emergency department. ? ?Please consider cessation of alcohol use ? ?Please follow-up with PCP for repeat potassium and magnesium level in the next 2 to 3 days ? ?Please return to the emergency department for any worsening or worrisome symptoms. ? ? ? ? ? ? ? ?  ?Jeanell Sparrow, DO ?05/20/21 0002 ? ?

## 2021-05-19 NOTE — Discharge Instructions (Signed)
?  [facility] ? ?[address] ?[place], [place]  ? ? ? ?[place] ? ?[place], [place] ? ?

## 2021-05-19 NOTE — ED Provider Notes (Signed)
?[facility] ?Provider Note ? ? ?CSN: [id number] ?Arrival date & time: 05/19/21  3646 ? ?  ? ?History ? ?Chief Complaint  ?Patient presents with  ? Abdominal Pain  ? ? ?Toni Gutierrez is a 61 y.o. female. ? ?Patient is a 61 year old female with a history of hypertension, hyperlipidemia, prior alcohol abuse but in the last few months has started drinking 2 or 3 smash alcoholic drinks daily, GERD and prior history of pancolitis who is presenting today with complaints of abdominal pain, nausea, vomiting and diarrhea.  Patient reports yesterday was a normal day, however she woke up this morning around 6 AM started having significant abdominal cramping and pain with associated vomiting and diarrhea.  She reports the pain is just crippling at times and kind of comes in waves.  She has had prior cholecystectomy but denies any other known surgeries of her abdomen.  She has not had fever or urinary complaints.  She denies any cough congestion or shortness of breath.  Patient reports her nutrition has not been great because she just feels full very quickly and does not eat a whole lot. ? ?The history is provided by the patient.  ?Abdominal Pain ?Pain location:  Generalized ? ?  ? ?Home Medications ?Prior to Admission medications   ?Medication Sig Start Date End Date Taking? Authorizing Provider  ?atorvastatin (LIPITOR) 40 MG tablet Take 40 mg by mouth at bedtime.    [provider]  ?cholecalciferol (VITAMIN D3) 25 MCG (1000 UNIT) tablet Take 1,000 Units by mouth daily.    [provider]  ?CRANBERRY-VITAMIN C PO Take 1 capsule by mouth daily.    [provider]  ?docusate sodium (COLACE) 100 MG capsule Take 100 mg by mouth daily.    [provider]  ?hydrOXYzine (ATARAX/VISTARIL) 50 MG tablet Take 50-100 mg by mouth at bedtime. 08/07/20   [provider]  ?loperamide (IMODIUM) 2 MG capsule Take 1 capsule (2 mg total) by mouth 2 (two) times daily  as needed for diarrhea or loose stools. 01/19/21   Jaynee Eagles, PA-C  ?losartan (COZAAR) 50 MG tablet Take 50 mg by mouth daily.    [provider]  ?Multiple Vitamins-Minerals (MULTIVITAMIN WITH MINERALS) tablet Take 1 tablet by mouth daily.    [provider]  ?omeprazole (PRILOSEC) 20 MG capsule Take 2 capsules (40 mg total) by mouth daily. ?Patient taking differently: Take 20 mg by mouth daily. 03/29/16   Rai, Vernelle Emerald, MD  ?ondansetron (ZOFRAN-ODT) 8 MG disintegrating tablet Take 1 tablet (8 mg total) by mouth every 8 (eight) hours as needed for nausea or vomiting. 01/19/21   Jaynee Eagles, PA-C  ?OVER THE COUNTER MEDICATION Take 1 capsule by mouth daily. Immune Health    [provider]  ?predniSONE (DELTASONE) 10 MG tablet Take 3 tablets (30 mg total) by mouth daily with breakfast. 01/19/21   Jaynee Eagles, PA-C  ?pregabalin (LYRICA) 75 MG capsule Take 75 mg by mouth 2 (two) times daily.    [provider]  ?thiamine (VITAMIN B-1) 100 MG tablet Take 100 mg by mouth daily.    [provider]  ?tiZANidine (ZANAFLEX) 2 MG tablet Take 2 mg by mouth every 8 (eight) hours as needed for muscle spasms.    [provider]  ?traZODone (DESYREL) 50 MG tablet Take 50 mg by mouth at bedtime. 08/07/20   [provider]  ?   ? ?Allergies    ?Patient has no known allergies.   ? ?  Review of Systems   ?Review of Systems  ?Gastrointestinal:  Positive for abdominal pain.  ? ?Physical Exam ?Updated Vital Signs ?BP (!) 200/103   Pulse 83   Temp 97.8 ?F (36.6 ?C) (Temporal)   Resp (!) 22   Ht '4\' 11"'$  (1.499 m)   Wt 57.6 kg   LMP 12/25/2010   SpO2 98%   BMI 25.65 kg/m?  ?Physical Exam ?Vitals and nursing note reviewed.  ?Constitutional:   ?   General: She is in acute distress.  ?   Appearance: She is well-developed.  ?   Comments: Appears to be in pain  ?HENT:  ?   Head: Normocephalic and atraumatic.  ?Eyes:  ?   Pupils: Pupils are equal, round, and reactive to light.   ?Cardiovascular:  ?   Rate and Rhythm: Normal rate and regular rhythm.  ?   Heart sounds: Normal heart sounds. No murmur heard. ?  No friction rub.  ?Pulmonary:  ?   Effort: Pulmonary effort is normal.  ?   Breath sounds: Normal breath sounds. No wheezing or rales.  ?Abdominal:  ?   General: There is no distension.  ?   Palpations: Abdomen is soft.  ?   Tenderness: There is generalized abdominal tenderness. There is no right CVA tenderness, left CVA tenderness, guarding or rebound.  ?   Comments: Decreased bowel sounds  ?Musculoskeletal:     ?   General: No tenderness. Normal range of motion.  ?   Right lower leg: No edema.  ?   Left lower leg: No edema.  ?   Comments: No edema  ?Skin: ?   General: Skin is warm and dry.  ?   Findings: No rash.  ?Neurological:  ?   Mental Status: She is alert and oriented to person, place, and time.  ?   Cranial Nerves: No cranial nerve deficit.  ?Psychiatric:     ?   Behavior: Behavior normal.  ? ? ?ED Results / Procedures / Treatments   ?Labs ?(all labs ordered are listed, but only abnormal results are displayed) ?Labs Reviewed  ?COMPREHENSIVE METABOLIC PANEL - Abnormal; Notable for the following components:  ?    Result Value  ? Potassium 2.4 (*)   ? BUN 5 (*)   ? Calcium 7.8 (*)   ? Total Protein 6.2 (*)   ? AST 62 (*)   ? All other components within normal limits  ?CBC - Abnormal; Notable for the following components:  ? RBC 2.70 (*)   ? Hemoglobin 9.8 (*)   ? HCT 26.8 (*)   ? MCH 36.3 (*)   ? MCHC 36.6 (*)   ? RDW 17.0 (*)   ? nRBC 0.3 (*)   ? All other components within normal limits  ?URINALYSIS, ROUTINE W REFLEX MICROSCOPIC - Abnormal; Notable for the following components:  ? Specific Gravity, Urine >1.046 (*)   ? Protein, ur 30 (*)   ? All other components within normal limits  ?LIPASE, BLOOD  ?POTASSIUM  ?MAGNESIUM  ? ? ?EKG ?None ? ?Radiology ?CT ABDOMEN PELVIS W CONTRAST ? ?Result Date: 05/19/2021 ?CLINICAL DATA:  Abdominal pain. Nausea since this morning. Prior hernia  repair. EXAM: CT ABDOMEN AND PELVIS WITH CONTRAST TECHNIQUE: Multidetector CT imaging of the abdomen and pelvis was performed using the standard protocol following bolus administration of intravenous contrast. RADIATION DOSE REDUCTION: This exam was performed according to the departmental dose-optimization program which includes automated exposure control, adjustment of the mA and/or kV  according to patient size and/or use of iterative reconstruction technique. CONTRAST:  1m OMNIPAQUE IOHEXOL 350 MG/ML SOLN COMPARISON:  08/05/2018 FINDINGS: Lower chest: Clear lung bases. Mild cardiomegaly. Interval hiatal hernia repair with small residual or recurrent hernia identified. Hepatobiliary: Moderate hepatic steatosis. Cholecystectomy, without biliary ductal dilatation. Pancreas: Normal, without mass or ductal dilatation. Spleen: Normal in size, without focal abnormality. Adrenals/Urinary Tract: Normal adrenal glands. Normal kidneys, without hydronephrosis. The bladder appears mildly thick walled, but is decompressed. Stomach/Bowel: Normal remainder of the stomach. Mild diffuse colonic wall thickening, mucosal hyperenhancement, and pericolonic edema. Normal terminal ileum and appendix. Normal small bowel. Vascular/Lymphatic: Aortic atherosclerosis. No abdominopelvic adenopathy. Reproductive: Central uterine hypoattenuation, including on 56/2. This is similar. No adnexal mass. Other: No significant free fluid. Mild pelvic floor laxity. No free intraperitoneal air. Small fat containing paraumbilical hernia. Musculoskeletal: Convex left lumbar spine curvature. Lumbosacral spondylosis. IMPRESSION: 1. Pancolitis. Distribution favors infection (exclude C difficile) or less likely ulcerative colitis. 2. Chronically prominent central uterine hypoattenuation for age. Correlate with postmenopausal bleeding and if any such history, consider pelvic ultrasound. 3. Interval repair of hiatal hernia with small residual or recurrent  hernia identified. 4. Hepatic steatosis. 5.  Aortic Atherosclerosis (ICD10-I70.0). Electronically Signed   By: KAbigail MiyamotoM.D.   On: 05/19/2021 12:23   ? ?Procedures ?Procedures  ? ? ?Medications Ordered in ED ?M

## 2021-05-19 NOTE — ED Notes (Signed)
Provider wanted the Potassium checked after the second set of Potassium. ?

## 2021-05-19 NOTE — ED Triage Notes (Signed)
Pt arrives to ED with c/o epigastric abdominal pain that started today. The pain is described as intermittent and sharp/stabbing. Associated symptoms include nausea, diarrhea. No vomiting, constipation.  ?

## 2021-05-19 NOTE — ED Triage Notes (Signed)
Sharp epigastric/midline pain starting this morning. Hx of stomach problems. Reports associated nausea. Says it no longer hurts at this time ?

## 2021-06-23 ENCOUNTER — Other Ambulatory Visit: Payer: Self-pay | Admitting: Registered Nurse

## 2021-06-23 DIAGNOSIS — R0989 Other specified symptoms and signs involving the circulatory and respiratory systems: Secondary | ICD-10-CM

## 2021-06-24 ENCOUNTER — Other Ambulatory Visit: Payer: Self-pay | Admitting: Family

## 2021-06-24 DIAGNOSIS — R0989 Other specified symptoms and signs involving the circulatory and respiratory systems: Secondary | ICD-10-CM

## 2021-06-27 ENCOUNTER — Inpatient Hospital Stay: Admission: RE | Admit: 2021-06-27 | Payer: Medicare Other | Source: Ambulatory Visit

## 2021-09-12 ENCOUNTER — Other Ambulatory Visit: Payer: Self-pay | Admitting: Family

## 2021-09-12 ENCOUNTER — Emergency Department (HOSPITAL_BASED_OUTPATIENT_CLINIC_OR_DEPARTMENT_OTHER): Payer: Medicare Other

## 2021-09-12 ENCOUNTER — Emergency Department (HOSPITAL_BASED_OUTPATIENT_CLINIC_OR_DEPARTMENT_OTHER)
Admission: EM | Admit: 2021-09-12 | Discharge: 2021-09-13 | Disposition: A | Discharge status: Home / Self Care | Payer: Medicare Other | Attending: Emergency Medicine | Admitting: Emergency Medicine

## 2021-09-12 ENCOUNTER — Other Ambulatory Visit: Payer: Self-pay

## 2021-09-12 DIAGNOSIS — M791 Myalgia, unspecified site: Secondary | ICD-10-CM | POA: Insufficient documentation

## 2021-09-12 DIAGNOSIS — R2241 Localized swelling, mass and lump, right lower limb: Secondary | ICD-10-CM | POA: Insufficient documentation

## 2021-09-12 DIAGNOSIS — Z79899 Other long term (current) drug therapy: Secondary | ICD-10-CM | POA: Insufficient documentation

## 2021-09-12 DIAGNOSIS — M79604 Pain in right leg: Secondary | ICD-10-CM | POA: Diagnosis not present

## 2021-09-12 DIAGNOSIS — R609 Edema, unspecified: Secondary | ICD-10-CM

## 2021-09-12 DIAGNOSIS — R600 Localized edema: Secondary | ICD-10-CM

## 2021-09-12 DIAGNOSIS — M7918 Myalgia, other site: Secondary | ICD-10-CM

## 2021-09-12 LAB — COMPREHENSIVE METABOLIC PANEL
ALT: 37 U/L (ref 0–44)
AST: 55 U/L — ABNORMAL HIGH (ref 15–41)
Albumin: 3.7 g/dL (ref 3.5–5.0)
Alkaline Phosphatase: 78 U/L (ref 38–126)
Anion gap: 7 (ref 5–15)
BUN: 8 mg/dL (ref 8–23)
CO2: 28 mmol/L (ref 22–32)
Calcium: 8.3 mg/dL — ABNORMAL LOW (ref 8.9–10.3)
Chloride: 106 mmol/L (ref 98–111)
Creatinine, Ser: 0.76 mg/dL (ref 0.44–1.00)
GFR, Estimated: >60 mL/min (ref >60)
Glucose, Bld: 92 mg/dL (ref 70–99)
Potassium: 3.2 mmol/L — ABNORMAL LOW (ref 3.5–5.1)
Sodium: 141 mmol/L (ref 135–145)
Total Bilirubin: 0.5 mg/dL (ref 0.3–1.2)
Total Protein: 6.3 g/dL — ABNORMAL LOW (ref 6.5–8.1)

## 2021-09-12 LAB — CBC WITH DIFFERENTIAL/PLATELET
Abs Immature Granulocytes: 0.01 10*3/uL (ref 0.00–0.07)
Basophils Absolute: 0 10*3/uL (ref 0.0–0.1)
Basophils Relative: 0 %
Eosinophils Absolute: 0.1 10*3/uL (ref 0.0–0.5)
Eosinophils Relative: 2 %
HCT: 30.8 % — ABNORMAL LOW (ref 36.0–46.0)
Hemoglobin: 10.8 g/dL — ABNORMAL LOW (ref 12.0–15.0)
Immature Granulocytes: 0 %
Lymphocytes Relative: 49 %
Lymphs Abs: 2.7 10*3/uL (ref 0.7–4.0)
MCH: 35.2 pg — ABNORMAL HIGH (ref 26.0–34.0)
MCHC: 35.1 g/dL (ref 30.0–36.0)
MCV: 100.3 fL — ABNORMAL HIGH (ref 80.0–100.0)
Monocytes Absolute: 0.3 10*3/uL (ref 0.1–1.0)
Monocytes Relative: 5 %
Neutro Abs: 2.4 10*3/uL (ref 1.7–7.7)
Neutrophils Relative %: 44 %
Platelets: 169 10*3/uL (ref 150–400)
RBC: 3.07 MIL/uL — ABNORMAL LOW (ref 3.87–5.11)
RDW: 15.2 % (ref 11.5–15.5)
WBC: 5.5 10*3/uL (ref 4.0–10.5)
nRBC: 0 % (ref 0.0–0.2)

## 2021-09-12 LAB — TROPONIN I (HIGH SENSITIVITY): Troponin I (High Sensitivity): 15 ng/L (ref <18)

## 2021-09-12 MED ORDER — IOHEXOL 350 MG/ML SOLN
100.0000 mL | Freq: Once | INTRAVENOUS | Status: AC | PRN
  Administered 2021-09-12: 80 mL via INTRAVENOUS

## 2021-09-12 NOTE — Discharge Instructions (Signed)
Your history, exam, work-up today did not show evidence of blood clot in the legs or lungs.  Your other work-up was similar to prior and reassuring.  We feel you are safe for discharge home and suspect this is more musculoskeletal in nature.  Please rest and stay hydrated and if any symptoms change or worsen acutely, please return to the nearest emergency department.  Please follow-up with your primary doctor.

## 2021-09-12 NOTE — ED Triage Notes (Signed)
Started 1 week ago, swelling in right foot and ankle swelling. No injury. Some mild pain in calf when walking.  Has improved some (pt has a picture from a few day ago it was worse) Seen by PCP today and advised to get it checked out.

## 2021-09-12 NOTE — ED Provider Notes (Signed)
[facility] Provider Note   CSN: [id number] Arrival date & time: 09/12/21  1857     History  Chief Complaint  Patient presents with   Leg Swelling    Toni Gutierrez is a 61 y.o. female.  The history is provided by the patient and medical records. No language interpreter was used.  Leg Pain Location:  Leg Time since incident:  1 week Injury: no   Leg location:  R lower leg Pain details:    Quality:  Aching and cramping   Radiates to:  Does not radiate   Severity:  Moderate   Onset quality:  Gradual   Timing:  Constant   Progression:  Unchanged Chronicity:  New Relieved by:  Nothing Worsened by:  Nothing Ineffective treatments:  None tried Associated symptoms: swelling   Associated symptoms: no back pain, no fatigue, no fever, no itching, no muscle weakness, no numbness, no stiffness and no tingling        Home Medications Prior to Admission medications   Medication Sig Start Date End Date Taking? Authorizing Provider  atorvastatin (LIPITOR) 40 MG tablet Take 40 mg by mouth at bedtime.    [provider]  cholecalciferol (VITAMIN D3) 25 MCG (1000 UNIT) tablet Take 1,000 Units by mouth daily.    [provider]  CRANBERRY-VITAMIN C PO Take 1 capsule by mouth daily.    [provider]  dicyclomine (BENTYL) 20 MG tablet Take 1 tablet (20 mg total) by mouth 2 (two) times daily. 05/19/21   Jeanell Sparrow, DO  docusate sodium (COLACE) 100 MG capsule Take 100 mg by mouth daily.    [provider]  hydrOXYzine (ATARAX/VISTARIL) 50 MG tablet Take 50-100 mg by mouth at bedtime. 08/07/20   [provider]  loperamide (IMODIUM) 2 MG capsule Take 1 capsule (2 mg total) by mouth 2 (two) times daily as needed for diarrhea or loose stools. 01/19/21   Jaynee Eagles, PA-C  losartan (COZAAR) 50 MG tablet Take 50 mg by mouth daily.    [provider]  Multiple Vitamins-Minerals (MULTIVITAMIN WITH  MINERALS) tablet Take 1 tablet by mouth daily.    [provider]  omeprazole (PRILOSEC) 20 MG capsule Take 2 capsules (40 mg total) by mouth daily. Patient taking differently: Take 20 mg by mouth daily. 03/29/16   Rai, Ripudeep K, MD  ondansetron (ZOFRAN-ODT) 8 MG disintegrating tablet Take 1 tablet (8 mg total) by mouth every 8 (eight) hours as needed for nausea or vomiting. 01/19/21   Jaynee Eagles, PA-C  OVER THE COUNTER MEDICATION Take 1 capsule by mouth daily. Immune Health    [provider]  predniSONE (DELTASONE) 10 MG tablet Take 3 tablets (30 mg total) by mouth daily with breakfast. 01/19/21   Jaynee Eagles, PA-C  pregabalin (LYRICA) 75 MG capsule Take 75 mg by mouth 2 (two) times daily.    [provider]  sucralfate (CARAFATE) 1 g tablet Take 1 tablet (1 g total) by mouth 4 (four) times daily -  with meals and at bedtime for 7 days. 05/19/21 05/26/21  Wynona Dove A, DO  thiamine (VITAMIN B-1) 100 MG tablet Take 100 mg by mouth daily.    [provider]  tiZANidine (ZANAFLEX) 2 MG tablet Take 2 mg by mouth every 8 (eight) hours as needed for muscle spasms.    [provider]  traZODone (DESYREL) 50 MG tablet Take 50 mg by mouth at bedtime. 08/07/20   [provider]  Allergies    Patient has no known allergies.    Review of Systems   Review of Systems  Constitutional:  Negative for chills, diaphoresis, fatigue and fever.  HENT:  Negative for congestion.   Respiratory:  Negative for cough, chest tightness, shortness of breath and wheezing.   Cardiovascular:  Positive for chest pain (brief and resolved yestgerday) and leg swelling. Negative for palpitations.  Gastrointestinal:  Negative for abdominal pain, constipation, diarrhea, nausea and vomiting.  Genitourinary:  Negative for dysuria, flank pain and frequency.  Musculoskeletal:  Negative for back pain, neck stiffness and stiffness.  Skin:  Negative for itching, rash and wound.   Neurological:  Negative for weakness, light-headedness and headaches.  Psychiatric/Behavioral:  Negative for agitation and confusion.   All other systems reviewed and are negative.   Physical Exam Updated Vital Signs BP 100/66 (BP Location: Right Arm)   Pulse 82   Temp 98.8 F (37.1 C)   Resp 18   LMP 12/25/2010   SpO2 100%  Physical Exam Vitals and nursing note reviewed.  Constitutional:      General: She is not in acute distress.    Appearance: She is well-developed. She is not ill-appearing, toxic-appearing or diaphoretic.  HENT:     Head: Normocephalic and atraumatic.     Nose: No congestion or rhinorrhea.     Mouth/Throat:     Pharynx: No oropharyngeal exudate or posterior oropharyngeal erythema.  Eyes:     Extraocular Movements: Extraocular movements intact.     Conjunctiva/sclera: Conjunctivae normal.     Pupils: Pupils are equal, round, and reactive to light.  Cardiovascular:     Rate and Rhythm: Normal rate and regular rhythm.     Heart sounds: No murmur heard. Pulmonary:     Effort: Pulmonary effort is normal. No respiratory distress.     Breath sounds: Normal breath sounds. No wheezing, rhonchi or rales.  Chest:     Chest wall: No tenderness.  Abdominal:     Palpations: Abdomen is soft.     Tenderness: There is no abdominal tenderness. There is no right CVA tenderness, left CVA tenderness, guarding or rebound.  Musculoskeletal:        General: Tenderness present. No swelling.     Cervical back: Neck supple. No tenderness.     Right lower leg: Edema present.     Left lower leg: No edema.     Comments: Tenderness and mild swelling in the right calf.  Intact sensation, strength, and pulses.  No tenderness of the actual ankle bones on exam.  Skin:    General: Skin is warm and dry.     Capillary Refill: Capillary refill takes less than 2 seconds.     Findings: No erythema or rash.  Neurological:     General: No focal deficit present.     Mental Status: She  is alert.  Psychiatric:        Mood and Affect: Mood normal.     ED Results / Procedures / Treatments   Labs (all labs ordered are listed, but only abnormal results are displayed) Labs Reviewed  CBC WITH DIFFERENTIAL/PLATELET - Abnormal; Notable for the following components:      Result Value   RBC 3.07 (*)    Hemoglobin 10.8 (*)    HCT 30.8 (*)    MCV 100.3 (*)    MCH 35.2 (*)    All other components within normal limits  COMPREHENSIVE METABOLIC PANEL - Abnormal; Notable for the following components:  Potassium 3.2 (*)    Calcium 8.3 (*)    Total Protein 6.3 (*)    AST 55 (*)    All other components within normal limits  TROPONIN I (HIGH SENSITIVITY)  TROPONIN I (HIGH SENSITIVITY)    EKG EKG Interpretation  Date/Time:  Friday September 12 2021 21:21:13 EDT Ventricular Rate:  76 PR Interval:  146 QRS Duration: 71 QT Interval:  553 QTC Calculation: 622 R Axis:   64 Text Interpretation: Sinus rhythm Nonspecific T abnormalities, lateral leads Prolonged QT interval when compared to prior,  longer QTc. No STEMI Confirmed by Antony Blackbird [id number]) on 09/12/2021 9:49:07 PM  Radiology CT Angio Chest PE W and/or Wo Contrast  Result Date: 09/12/2021 CLINICAL DATA:  Pulmonary embolism (PE) suspected, high prob Unilateral leg swelling on the right with new intermittent chest pain. Rule out PE EXAM: CT ANGIOGRAPHY CHEST WITH CONTRAST TECHNIQUE: Multidetector CT imaging of the chest was performed using the standard protocol during bolus administration of intravenous contrast. Multiplanar CT image reconstructions and MIPs were obtained to evaluate the vascular anatomy. RADIATION DOSE REDUCTION: This exam was performed according to the departmental dose-optimization program which includes automated exposure control, adjustment of the mA and/or kV according to patient size and/or use of iterative reconstruction technique. CONTRAST:  28m OMNIPAQUE IOHEXOL 350 MG/ML SOLN COMPARISON:  CT abdomen  pelvis 05/19/2021 FINDINGS: Cardiovascular: Satisfactory opacification of the pulmonary arteries to the segmental level. No evidence of pulmonary embolism. Normal heart size. No significant pericardial effusion. The thoracic aorta is normal in caliber. Mild atherosclerotic plaque of the thoracic aorta. Left anterior descending coronary artery calcifications. Mediastinum/Nodes: No enlarged mediastinal, hilar, or axillary lymph nodes. Thyroid gland, trachea, and esophagus demonstrate no significant findings. At least small volume hiatal hernia. Surgical changes noted along the gastroesophageal junction/gastric fundus. Lungs/Pleura: No focal consolidation. No pulmonary nodule. No pulmonary mass. No pleural effusion. No pneumothorax. Upper Abdomen: Status post cholecystectomy.  No acute abnormality. Musculoskeletal: No chest wall abnormality. No suspicious lytic or blastic osseous lesions. No acute displaced fracture. Left shoulder surgical hardware partially visualized. Review of the MIP images confirms the above findings. IMPRESSION: 1. No pulmonary embolus. 2. No acute intrapulmonary abnormality. 3. At least small volume hiatal hernia-possibly recurrent given surgical changes along the gastroesophageal junction/gastric fundus that may represent fundoplication. Electronically Signed   By: MIven FinnM.D.   On: 09/12/2021 22:30   UKoreaVenous Img Lower Unilateral Right  Result Date: 09/12/2021 CLINICAL DATA:  Right ankle swelling for 2 weeks. EXAM: RIGHT LOWER EXTREMITY VENOUS DOPPLER ULTRASOUND TECHNIQUE: Gray-scale sonography with compression, as well as color and duplex ultrasound, were performed to evaluate the deep venous system(s) from the level of the common femoral vein through the popliteal and proximal calf veins. COMPARISON:  None Available. FINDINGS: VENOUS Normal compressibility of the common femoral, superficial femoral, and popliteal veins, as well as the visualized calf veins. Visualized portions  of profunda femoral vein and great saphenous vein unremarkable. No filling defects to suggest DVT on grayscale or color Doppler imaging. Doppler waveforms show normal direction of venous flow, normal respiratory plasticity and response to augmentation. Limited views of the contralateral common femoral vein are unremarkable. OTHER None. Limitations: Subcutaneous edema is noted at the calf and ankle. IMPRESSION: No evidence of deep venous thrombosis. Electronically Signed   By: LBrett FairyM.D.   On: 09/12/2021 22:17    Procedures Procedures    Medications Ordered in ED Medications  iohexol (OMNIPAQUE) 350 MG/ML injection 100 mL (80 mLs  Intravenous Contrast Given 09/12/21 2206)    ED Course/ Medical Decision Making/ A&P                           Medical Decision Making Amount and/or Complexity of Data Reviewed Labs: ordered. Radiology: ordered.  Risk Prescription drug management.    Kandas Luria Rosario is a 61 y.o. female with a past medical history significant for hyperlipidemia, GERD, previous cholecystectomy, hiatal hernia status post repair, and history of heartburn who presents with unilateral right leg swelling for the last 1 week and intermittent chest pain yesterday.  According to patient, for the last week she has had right-sided foot and ankle swelling.  She reports no injury to her knowledge but is having pain in her right calf towards her heel.  She denies any numbness, tingling, or weakness.  Denies any left leg symptoms.  Has never had this before.  She denies any nausea, vomiting, constipation, diarrhea, or urinary changes.  Denies history of heart failure.  She denies history of blood clots however she reports yesterday she had a very bad sensation and felt a sudden onset of severe left-sided chest pressure.  She reports it went away and has since resolved but she is concerned about it.  She denies fevers, chills, congestion, or cough.  She denies any shortness of breath  now.  On exam, patient does have some swelling in her right leg compared to left and does have some tenderness in her right calf compared to left.  Intact sensation, strength, and pulses.  No erythema or laceration or abrasion seen.  Does not appear cellulitic on my exam.  Knee and hips nontender.  Abdomen and chest nontender.  No murmur.  Lungs were clear.  Intact pulses in upper extremities.  EKG showed no sTEMI  Given the unilateral leg swelling and this severe sudden onset chest discomfort I am concerned that the patient has a DVT and possible PE.  We will get ultrasound of the leg and will get CT PE study among other labs to rule out cardiac disease.  Anticipate reassessment after work-up to determine disposition.   CT scan of the chest showed no evidence of pulmonary embolism or other abnormality.  Patient does have some likely recurrent hiatal hernia which she will follow-up with her PCP and surgeon for.  Ultrasound was negative of the leg and other work-up similar to prior.  Patient is feeling better and we feel she is safe for discharge home.  She reports that chest discomfort was very brief and has not had any chest discomfort today.  She will follow-up with her PCP and maintain hydration.  She will rest.  She likely is more musculoskeletal pain and she agrees.  We will discharge for outpatient follow-up.  She understands strict return precautions.  She no other questions or concerns and was discharged in good condition.        Final Clinical Impression(s) / ED Diagnoses Final diagnoses:  Pain of right lower extremity  Musculoskeletal pain    Rx / DC Orders ED Discharge Orders     None       Clinical Impression: 1. Pain of right lower extremity   2. Musculoskeletal pain     Disposition: Discharge  Condition: Good  I have discussed the results, Dx and Tx plan with the pt(& family if present). He/she/they expressed understanding and agree(s) with the plan.  Discharge instructions discussed at great length. Strict return precautions  discussed and pt &/or family have verbalized understanding of the instructions. No further questions at time of discharge.    New Prescriptions   No medications on file    Follow Up: [name]., FNP [place] [id number] [phone]     [facility] [place] [address] [phone]        Khiley Lieser, Gwenyth Allegra, MD 09/12/21 2350

## 2021-09-16 ENCOUNTER — Other Ambulatory Visit: Payer: Medicare Other

## 2021-09-19 ENCOUNTER — Other Ambulatory Visit: Payer: Self-pay | Admitting: Family

## 2021-09-19 DIAGNOSIS — Z1231 Encounter for screening mammogram for malignant neoplasm of breast: Secondary | ICD-10-CM

## 2021-10-03 ENCOUNTER — Ambulatory Visit
Admission: RE | Admit: 2021-10-03 | Discharge: 2021-10-03 | Disposition: A | Discharge status: Home / Self Care | Payer: Medicare Other | Source: Ambulatory Visit | Attending: Family | Admitting: Family

## 2021-10-03 DIAGNOSIS — Z1231 Encounter for screening mammogram for malignant neoplasm of breast: Secondary | ICD-10-CM

## 2021-11-20 ENCOUNTER — Encounter (HOSPITAL_BASED_OUTPATIENT_CLINIC_OR_DEPARTMENT_OTHER): Payer: Self-pay | Admitting: Emergency Medicine

## 2021-11-20 ENCOUNTER — Other Ambulatory Visit: Payer: Self-pay

## 2021-11-20 ENCOUNTER — Encounter (HOSPITAL_COMMUNITY): Payer: Self-pay

## 2021-11-20 ENCOUNTER — Emergency Department (HOSPITAL_BASED_OUTPATIENT_CLINIC_OR_DEPARTMENT_OTHER): Payer: Medicare Other

## 2021-11-20 ENCOUNTER — Observation Stay (HOSPITAL_BASED_OUTPATIENT_CLINIC_OR_DEPARTMENT_OTHER)
Admission: EM | Admit: 2021-11-20 | Discharge: 2021-11-21 | Disposition: A | Discharge status: Home / Self Care | Payer: Medicare Other | Attending: Internal Medicine | Admitting: Internal Medicine

## 2021-11-20 DIAGNOSIS — R778 Other specified abnormalities of plasma proteins: Secondary | ICD-10-CM | POA: Diagnosis not present

## 2021-11-20 DIAGNOSIS — K529 Noninfective gastroenteritis and colitis, unspecified: Secondary | ICD-10-CM | POA: Diagnosis not present

## 2021-11-20 DIAGNOSIS — Z96612 Presence of left artificial shoulder joint: Secondary | ICD-10-CM | POA: Insufficient documentation

## 2021-11-20 DIAGNOSIS — D649 Anemia, unspecified: Secondary | ICD-10-CM | POA: Insufficient documentation

## 2021-11-20 DIAGNOSIS — I4581 Long QT syndrome: Secondary | ICD-10-CM | POA: Diagnosis not present

## 2021-11-20 DIAGNOSIS — E876 Hypokalemia: Secondary | ICD-10-CM | POA: Diagnosis not present

## 2021-11-20 DIAGNOSIS — R9431 Abnormal electrocardiogram [ECG] [EKG]: Secondary | ICD-10-CM

## 2021-11-20 DIAGNOSIS — I10 Essential (primary) hypertension: Secondary | ICD-10-CM | POA: Diagnosis not present

## 2021-11-20 DIAGNOSIS — Z8616 Personal history of COVID-19: Secondary | ICD-10-CM | POA: Diagnosis not present

## 2021-11-20 DIAGNOSIS — Z79899 Other long term (current) drug therapy: Secondary | ICD-10-CM | POA: Diagnosis not present

## 2021-11-20 DIAGNOSIS — R1013 Epigastric pain: Secondary | ICD-10-CM | POA: Diagnosis present

## 2021-11-20 DIAGNOSIS — E785 Hyperlipidemia, unspecified: Secondary | ICD-10-CM | POA: Diagnosis present

## 2021-11-20 DIAGNOSIS — R7989 Other specified abnormal findings of blood chemistry: Secondary | ICD-10-CM

## 2021-11-20 LAB — CBC
HCT: 27.9 % — ABNORMAL LOW (ref 36.0–46.0)
HCT: 25.4 % — ABNORMAL LOW (ref 36.0–46.0)
Hemoglobin: 9.9 g/dL — ABNORMAL LOW (ref 12.0–15.0)
Hemoglobin: 8.6 g/dL — ABNORMAL LOW (ref 12.0–15.0)
MCH: 39.1 pg — ABNORMAL HIGH (ref 26.0–34.0)
MCH: 38.1 pg — ABNORMAL HIGH (ref 26.0–34.0)
MCHC: 35.5 g/dL (ref 30.0–36.0)
MCHC: 33.9 g/dL (ref 30.0–36.0)
MCV: 110.3 fL — ABNORMAL HIGH (ref 80.0–100.0)
MCV: 112.4 fL — ABNORMAL HIGH (ref 80.0–100.0)
Platelets: 191 10*3/uL (ref 150–400)
Platelets: 179 10*3/uL (ref 150–400)
RBC: 2.53 MIL/uL — ABNORMAL LOW (ref 3.87–5.11)
RBC: 2.26 MIL/uL — ABNORMAL LOW (ref 3.87–5.11)
RDW: 15.1 % (ref 11.5–15.5)
RDW: 15.7 % — ABNORMAL HIGH (ref 11.5–15.5)
WBC: 11 10*3/uL — ABNORMAL HIGH (ref 4.0–10.5)
WBC: 8.3 10*3/uL (ref 4.0–10.5)
nRBC: 0.3 % — ABNORMAL HIGH (ref 0.0–0.2)
nRBC: 0.2 % (ref 0.0–0.2)

## 2021-11-20 LAB — COMPREHENSIVE METABOLIC PANEL
ALT: 12 U/L (ref 0–44)
AST: 37 U/L (ref 15–41)
Albumin: 3.9 g/dL (ref 3.5–5.0)
Alkaline Phosphatase: 70 U/L (ref 38–126)
Anion gap: 14 (ref 5–15)
BUN: 7 mg/dL — ABNORMAL LOW (ref 8–23)
CO2: 30 mmol/L (ref 22–32)
Calcium: 7.3 mg/dL — ABNORMAL LOW (ref 8.9–10.3)
Chloride: 97 mmol/L — ABNORMAL LOW (ref 98–111)
Creatinine, Ser: 0.85 mg/dL (ref 0.44–1.00)
GFR, Estimated: >60 mL/min (ref >60)
Glucose, Bld: 92 mg/dL (ref 70–99)
Potassium: 2.1 mmol/L — CL (ref 3.5–5.1)
Sodium: 141 mmol/L (ref 135–145)
Total Bilirubin: 0.8 mg/dL (ref 0.3–1.2)
Total Protein: 6.4 g/dL — ABNORMAL LOW (ref 6.5–8.1)

## 2021-11-20 LAB — BASIC METABOLIC PANEL
Anion gap: 12 (ref 5–15)
BUN: 7 mg/dL — ABNORMAL LOW (ref 8–23)
CO2: 29 mmol/L (ref 22–32)
Calcium: 7.1 mg/dL — ABNORMAL LOW (ref 8.9–10.3)
Chloride: 101 mmol/L (ref 98–111)
Creatinine, Ser: 0.8 mg/dL (ref 0.44–1.00)
GFR, Estimated: >60 mL/min (ref >60)
Glucose, Bld: 88 mg/dL (ref 70–99)
Potassium: 3.8 mmol/L (ref 3.5–5.1)
Sodium: 142 mmol/L (ref 135–145)

## 2021-11-20 LAB — URINALYSIS, ROUTINE W REFLEX MICROSCOPIC
Bilirubin Urine: NEGATIVE
Glucose, UA: NEGATIVE mg/dL
Hgb urine dipstick: NEGATIVE
Ketones, ur: NEGATIVE mg/dL
Leukocytes,Ua: NEGATIVE
Nitrite: NEGATIVE
Protein, ur: NEGATIVE mg/dL
Specific Gravity, Urine: 1.014 (ref 1.005–1.030)
pH: 7.5 (ref 5.0–8.0)

## 2021-11-20 LAB — CREATININE, SERUM
Creatinine, Ser: 0.73 mg/dL (ref 0.44–1.00)
GFR, Estimated: >60 mL/min (ref >60)

## 2021-11-20 LAB — MAGNESIUM
Magnesium: 0.9 mg/dL — CL (ref 1.7–2.4)
Magnesium: 3.3 mg/dL — ABNORMAL HIGH (ref 1.7–2.4)

## 2021-11-20 LAB — TROPONIN I (HIGH SENSITIVITY)
Troponin I (High Sensitivity): 25 ng/L — ABNORMAL HIGH (ref <18)
Troponin I (High Sensitivity): 33 ng/L — ABNORMAL HIGH (ref <18)

## 2021-11-20 LAB — LIPASE, BLOOD: Lipase: 17 U/L (ref 11–51)

## 2021-11-20 LAB — ETHANOL: Alcohol, Ethyl (B): <10 mg/dL (ref <10)

## 2021-11-20 MED ORDER — HYDRALAZINE HCL 20 MG/ML IJ SOLN
10.0000 mg | Freq: Three times a day (TID) | INTRAMUSCULAR | Status: DC | PRN
  Administered 2021-11-20: 10 mg via INTRAVENOUS
  Filled 2021-11-20: qty 1

## 2021-11-20 MED ORDER — FOLIC ACID 1 MG PO TABS
1.0000 mg | ORAL_TABLET | Freq: Every day | ORAL | Status: DC
  Administered 2021-11-20 – 2021-11-21 (×2): 1 mg via ORAL
  Filled 2021-11-20 (×2): qty 1

## 2021-11-20 MED ORDER — ATORVASTATIN CALCIUM 40 MG PO TABS
40.0000 mg | ORAL_TABLET | Freq: Every day | ORAL | Status: DC
  Administered 2021-11-20: 40 mg via ORAL
  Filled 2021-11-20: qty 1

## 2021-11-20 MED ORDER — OXYCODONE-ACETAMINOPHEN 10-325 MG PO TABS: 1.0000 | ORAL_TABLET | Freq: Four times a day (QID) | ORAL | Status: DC | PRN

## 2021-11-20 MED ORDER — SODIUM CHLORIDE 0.9 % IV SOLN: INTRAVENOUS | Status: DC

## 2021-11-20 MED ORDER — ONDANSETRON HCL 4 MG/2ML IJ SOLN
4.0000 mg | Freq: Once | INTRAMUSCULAR | Status: AC
  Administered 2021-11-20: 4 mg via INTRAVENOUS
  Filled 2021-11-20: qty 2

## 2021-11-20 MED ORDER — THIAMINE MONONITRATE 100 MG PO TABS
100.0000 mg | ORAL_TABLET | Freq: Every day | ORAL | Status: DC
  Administered 2021-11-20 – 2021-11-21 (×2): 100 mg via ORAL
  Filled 2021-11-20 (×2): qty 1

## 2021-11-20 MED ORDER — ALBUTEROL SULFATE (2.5 MG/3ML) 0.083% IN NEBU: 2.5000 mg | INHALATION_SOLUTION | RESPIRATORY_TRACT | Status: DC | PRN

## 2021-11-20 MED ORDER — MAGNESIUM SULFATE 2 GM/50ML IV SOLN
2.0000 g | Freq: Once | INTRAVENOUS | Status: AC
  Administered 2021-11-20: 2 g via INTRAVENOUS
  Filled 2021-11-20: qty 50

## 2021-11-20 MED ORDER — ACETAMINOPHEN 325 MG PO TABS
650.0000 mg | ORAL_TABLET | Freq: Four times a day (QID) | ORAL | Status: DC | PRN
  Administered 2021-11-20 – 2021-11-21 (×2): 650 mg via ORAL
  Filled 2021-11-20 (×2): qty 2

## 2021-11-20 MED ORDER — ACETAMINOPHEN 650 MG RE SUPP: 650.0000 mg | Freq: Four times a day (QID) | RECTAL | Status: DC | PRN

## 2021-11-20 MED ORDER — MORPHINE SULFATE (PF) 4 MG/ML IV SOLN
4.0000 mg | Freq: Once | INTRAVENOUS | Status: AC
  Administered 2021-11-20: 4 mg via INTRAVENOUS
  Filled 2021-11-20: qty 1

## 2021-11-20 MED ORDER — ACETAMINOPHEN 325 MG PO TABS: 325.0000 mg | ORAL_TABLET | ORAL | Status: DC | PRN

## 2021-11-20 MED ORDER — PANTOPRAZOLE SODIUM 40 MG PO TBEC
40.0000 mg | DELAYED_RELEASE_TABLET | Freq: Every day | ORAL | Status: DC
  Administered 2021-11-21: 40 mg via ORAL
  Filled 2021-11-20: qty 1

## 2021-11-20 MED ORDER — ALUM & MAG HYDROXIDE-SIMETH 200-200-20 MG/5ML PO SUSP
30.0000 mL | Freq: Once | ORAL | Status: AC
  Administered 2021-11-20: 30 mL via ORAL
  Filled 2021-11-20: qty 30

## 2021-11-20 MED ORDER — THIAMINE HCL 100 MG/ML IJ SOLN
100.0000 mg | Freq: Every day | INTRAMUSCULAR | Status: DC
  Filled 2021-11-20: qty 2

## 2021-11-20 MED ORDER — IOHEXOL 300 MG/ML  SOLN
100.0000 mL | Freq: Once | INTRAMUSCULAR | Status: AC | PRN
  Administered 2021-11-20: 80 mL via INTRAVENOUS

## 2021-11-20 MED ORDER — LIDOCAINE VISCOUS HCL 2 % MT SOLN
15.0000 mL | Freq: Once | OROMUCOSAL | Status: AC
  Administered 2021-11-20: 15 mL via ORAL
  Filled 2021-11-20: qty 15

## 2021-11-20 MED ORDER — OXYCODONE HCL 5 MG PO TABS
10.0000 mg | ORAL_TABLET | ORAL | Status: DC | PRN
  Administered 2021-11-20 – 2021-11-21 (×3): 10 mg via ORAL
  Filled 2021-11-20 (×3): qty 2

## 2021-11-20 MED ORDER — LOSARTAN POTASSIUM 50 MG PO TABS
50.0000 mg | ORAL_TABLET | Freq: Every day | ORAL | Status: DC
  Administered 2021-11-20 – 2021-11-21 (×2): 50 mg via ORAL
  Filled 2021-11-20 (×2): qty 1

## 2021-11-20 MED ORDER — LORAZEPAM 2 MG/ML IJ SOLN: 1.0000 mg | INTRAMUSCULAR | Status: DC | PRN

## 2021-11-20 MED ORDER — ADULT MULTIVITAMIN W/MINERALS CH
1.0000 | ORAL_TABLET | Freq: Every day | ORAL | Status: DC
  Administered 2021-11-20 – 2021-11-21 (×2): 1 via ORAL
  Filled 2021-11-20 (×2): qty 1

## 2021-11-20 MED ORDER — LORAZEPAM 1 MG PO TABS: 1.0000 mg | ORAL_TABLET | ORAL | Status: DC | PRN

## 2021-11-20 MED ORDER — ENOXAPARIN SODIUM 40 MG/0.4ML IJ SOSY
40.0000 mg | PREFILLED_SYRINGE | INTRAMUSCULAR | Status: DC
  Administered 2021-11-20: 40 mg via SUBCUTANEOUS
  Filled 2021-11-20: qty 0.4

## 2021-11-20 MED ORDER — POTASSIUM CHLORIDE 20 MEQ PO PACK
60.0000 meq | PACK | Freq: Once | ORAL | Status: AC
  Administered 2021-11-20: 60 meq via ORAL
  Filled 2021-11-20: qty 3

## 2021-11-20 MED ORDER — POTASSIUM CHLORIDE 10 MEQ/100ML IV SOLN
10.0000 meq | INTRAVENOUS | Status: AC
  Administered 2021-11-20 (×3): 10 meq via INTRAVENOUS
  Filled 2021-11-20 (×3): qty 100

## 2021-11-20 NOTE — ED Triage Notes (Signed)
Pt arrives to ED with c/o abdominal pain that started at 4am this morning. Associated symptoms include nausea, vomiting, and diarrhea.

## 2021-11-20 NOTE — ED Provider Notes (Signed)
[facility] EMERGENCY DEPT Provider Note  CSN: [id number] Arrival date & time: 11/20/21 1962  Chief Complaint(s) Abdominal Pain  HPI Toni Gutierrez is a 61 y.o. female with history of alcohol use disorder, GERD hypertension, hyperlipidemia presenting to the emergency department with nausea, vomiting, diarrhea.  Patient reports the symptoms began around 4 AM.  Abdominal pain is epigastric.  She denies any hematemesis.  She reports that she had similar symptoms before when she was told she had low potassium.  She denies fevers or chills.  No melena.  No back pain.  Symptoms are moderate.   Past Medical History Past Medical History:  Diagnosis Date   Alcohol abuse    No alcohol intake since 2001   Allergic rhinitis    Anemia    Arthritis    "back, hands" (01/06/2018)   Carpal tunnel syndrome, right    Chronic lower back pain    MRI of cervical, lumbar spine (all obtained by Dr. Ronnie Derby in 2006)- showing mild DDD with bulges but without disc herniation, at multiple levels; mild facet joint dz, primarily at L3-4 and L4-5 bilaterally   GERD (gastroesophageal reflux disease)    Heartburn    History of COVID-19 05/2019   History of hiatal hernia    history of   Hyperlipidemia    Hypertension    Left knee pain    s/p ACL reconstruction by Dr. Ronnie Derby   Shoulder pain, bilateral    MRI right shoulder showing tear of infraspinatus tendon with bursal surface fraying of the infraspinatus   Patient Active Problem List   Diagnosis Date Noted   S/P repair of paraesophageal hernia 07/11/2019   Chronic prescription opiate use 03/25/2016   Essential hypertension, malignant 07/29/2015   Rhinitis, allergic 07/29/2015   GERD (gastroesophageal reflux disease) 11/17/2012   Chronic back pain 11/17/2012   Dental caries 11/17/2012   Sciatica 11/17/2012   Hypokalemia 08/16/2012   IT band syndrome 11/16/2011   Right leg pain 08/31/2011   Mononeuropathy of left upper extremity  07/15/2010   Acute pain of left shoulder 03/02/2007   PAIN, CHRONIC NEC 04/27/2006   Hyperlipidemia 03/02/2006   Alcohol abuse 11/20/2005   CARPAL TUNNEL SYNDROME, RIGHT 11/20/2005   ALLERGIC RHINITIS 11/20/2005   Home Medication(s) Prior to Admission medications   Medication Sig Start Date End Date Taking? Authorizing Provider  atorvastatin (LIPITOR) 40 MG tablet Take 40 mg by mouth at bedtime.   Yes [provider]  dicyclomine (BENTYL) 20 MG tablet Take 1 tablet (20 mg total) by mouth 2 (two) times daily. 05/19/21  Yes Wynona Dove A, DO  hydrOXYzine (ATARAX/VISTARIL) 50 MG tablet Take 50-100 mg by mouth at bedtime. 08/07/20  Yes [provider]  loperamide (IMODIUM) 2 MG capsule Take 1 capsule (2 mg total) by mouth 2 (two) times daily as needed for diarrhea or loose stools. 01/19/21  Yes Jaynee Eagles, PA-C  losartan (COZAAR) 50 MG tablet Take 50 mg by mouth daily.   Yes [provider]  mirtazapine (REMERON) 15 MG tablet Take 15 mg by mouth at bedtime. 09/22/21  Yes [provider]  Multiple Vitamins-Minerals (MULTIVITAMIN WITH MINERALS) tablet Take 1 tablet by mouth daily.   Yes [provider]  omeprazole (PRILOSEC) 20 MG capsule Take 2 capsules (40 mg total) by mouth daily. Patient taking differently: Take 20 mg by mouth daily. 03/29/16  Yes Rai, Ripudeep K, MD  oxyCODONE-acetaminophen (PERCOCET) 10-325 MG tablet Take 1 tablet by mouth 4 (four) times daily as  needed. 10/29/21  Yes [provider]  predniSONE (DELTASONE) 10 MG tablet Take 3 tablets (30 mg total) by mouth daily with breakfast. 01/19/21  Yes Jaynee Eagles, PA-C  ondansetron (ZOFRAN-ODT) 8 MG disintegrating tablet Take 1 tablet (8 mg total) by mouth every 8 (eight) hours as needed for nausea or vomiting. Patient not taking: Reported on 11/20/2021 01/19/21   Jaynee Eagles, PA-C  sucralfate (CARAFATE) 1 g tablet Take 1 tablet (1 g total) by mouth 4 (four) times daily -  with meals and  at bedtime for 7 days. 05/19/21 05/26/21  Jeanell Sparrow, DO                                                                                                                                    Past Surgical History Past Surgical History:  Procedure Laterality Date   ANTERIOR CRUCIATE LIGAMENT REPAIR Left    By Dr. Terrilyn Saver RELEASE Right 02/08/2019   Procedure: CARPAL TUNNEL RELEASE;  Surgeon: Latanya Maudlin, MD;  Location: [facility];  Service: Orthopedics;  Laterality: Right;   CESAREAN SECTION  1986   CHOLECYSTECTOMY N/A 06/25/2020   Procedure: LAPAROSCOPIC CHOLECYSTECTOMY WITH POSSIBLE INTRAOPERATIVE CHOLANGIOGRAM;  Surgeon: Greer Pickerel, MD;  Location: [facility];  Service: General;  Laterality: N/A;   ESOPHAGOGASTRODUODENOSCOPY N/A 07/11/2019   Procedure: UPPER ESOPHAGOGASTRODUODENOSCOPY (EGD);  Surgeon: Greer Pickerel, MD;  Location: [facility];  Service: General;  Laterality: N/A;   HIATAL HERNIA REPAIR N/A 07/11/2019   Procedure: LAPAROSCOPIC REPAIR OF PARAESOPHAGEAL HERNIA WITH TOUPET FUNDOPLICATION AND GASTROPEXY;  Surgeon: Greer Pickerel, MD;  Location: [facility];  Service: General;  Laterality: N/A;   REVERSE SHOULDER ARTHROPLASTY Left 09/19/2020   Procedure: REVERSE SHOULDER ARTHROPLASTY;  Surgeon: Tania Ade, MD;  Location: [facility];  Service: Orthopedics;  Laterality: Left;   SHOULDER ARTHROSCOPY W/ ROTATOR CUFF REPAIR Right    TUBAL LIGATION  1989   Family History Family History  Problem Relation Age of Onset   Stroke Mother    Colon cancer Neg Hx    Esophageal cancer Neg Hx    Rectal cancer Neg Hx    Stomach cancer Neg Hx     Social History Social History   Tobacco Use   Smoking status: Never   Smokeless tobacco: Never  Vaping Use   Vaping Use: Never used  Substance Use Topics   Alcohol use: Not Currently    Alcohol/week: 1.0 standard drink of alcohol    Types: 1 Glasses of wine per week    Comment: rare   Drug use: Not Currently    Allergies Patient has no known allergies.  Review of Systems Review of Systems  All other systems reviewed and are negative.   Physical Exam Vital Signs  I have reviewed the triage vital signs BP (!) 176/93   Pulse 84   Temp 98.7 F (37.1 C) (Oral)   Resp (!) 26  Ht 4' 11"  (1.499 m)   Wt 58.1 kg   LMP 12/25/2010   SpO2 97%   BMI 25.85 kg/m  Physical Exam Vitals and nursing note reviewed.  Constitutional:      General: She is not in acute distress.    Appearance: She is well-developed.  HENT:     Head: Normocephalic and atraumatic.     Mouth/Throat:     Mouth: Mucous membranes are moist.  Eyes:     Pupils: Pupils are equal, round, and reactive to light.  Cardiovascular:     Rate and Rhythm: Normal rate and regular rhythm.     Heart sounds: No murmur heard. Pulmonary:     Effort: Pulmonary effort is normal. No respiratory distress.     Breath sounds: Normal breath sounds.  Abdominal:     General: Abdomen is flat.     Palpations: Abdomen is soft.     Tenderness: There is abdominal tenderness (mild) in the epigastric area.  Musculoskeletal:        General: No tenderness.     Right lower leg: No edema.     Left lower leg: No edema.  Skin:    General: Skin is warm and dry.  Neurological:     General: No focal deficit present.     Mental Status: She is alert. Mental status is at baseline.  Psychiatric:        Mood and Affect: Mood normal.        Behavior: Behavior normal.     ED Results and Treatments Labs (all labs ordered are listed, but only abnormal results are displayed) Labs Reviewed  COMPREHENSIVE METABOLIC PANEL - Abnormal; Notable for the following components:      Result Value   Potassium 2.1 (*)    Chloride 97 (*)    BUN 7 (*)    Calcium 7.3 (*)    Total Protein 6.4 (*)    All other components within normal limits  CBC - Abnormal; Notable for the following components:   WBC 11.0 (*)    RBC 2.53 (*)    Hemoglobin 9.9 (*)    HCT 27.9  (*)    MCV 110.3 (*)    MCH 39.1 (*)    nRBC 0.3 (*)    All other components within normal limits  MAGNESIUM - Abnormal; Notable for the following components:   Magnesium 0.9 (*)    All other components within normal limits  BASIC METABOLIC PANEL - Abnormal; Notable for the following components:   BUN 7 (*)    Calcium 7.1 (*)    All other components within normal limits  MAGNESIUM - Abnormal; Notable for the following components:   Magnesium 3.3 (*)    All other components within normal limits  TROPONIN I (HIGH SENSITIVITY) - Abnormal; Notable for the following components:   Troponin I (High Sensitivity) 25 (*)    All other components within normal limits  TROPONIN I (HIGH SENSITIVITY) - Abnormal; Notable for the following components:   Troponin I (High Sensitivity) 33 (*)    All other components within normal limits  LIPASE, BLOOD  URINALYSIS, ROUTINE W REFLEX MICROSCOPIC  ETHANOL  Radiology CT Abdomen Pelvis W Contrast  Result Date: 11/20/2021 CLINICAL DATA:  Abdominal pain.  Nausea, vomiting and diarrhea. EXAM: CT ABDOMEN AND PELVIS WITH CONTRAST TECHNIQUE: Multidetector CT imaging of the abdomen and pelvis was performed using the standard protocol following bolus administration of intravenous contrast. RADIATION DOSE REDUCTION: This exam was performed according to the departmental dose-optimization program which includes automated exposure control, adjustment of the mA and/or kV according to patient size and/or use of iterative reconstruction technique. CONTRAST:  54m OMNIPAQUE IOHEXOL 300 MG/ML  SOLN COMPARISON:  CT abdomen dated 05/19/2021. FINDINGS: Lower chest: No acute abnormality. Hepatobiliary: Liver is diffusely low in density indicating fatty infiltration. No focal mass or lesion is seen within the liver. Status post cholecystectomy. No bile duct  dilatation. Pancreas: Unremarkable. No pancreatic ductal dilatation or surrounding inflammatory changes. Spleen: Normal in size without focal abnormality. Adrenals/Urinary Tract: Adrenal glands appear normal. Kidneys are unremarkable without mass, stone or hydronephrosis. No ureteral or bladder calculi are identified. Bladder is unremarkable. Stomach/Bowel: No dilated large or small bowel loops. Appendix is unremarkable. Small hiatal hernia, with surgical clips at the gastroesophageal junction which may be related to a previous hiatal hernia repair. Stomach is otherwise unremarkable, partially decompressed. Majority of the small and large bowel demonstrate wall thickening with adjacent fat stranding/inflammation. Fluid is present throughout the majority of the nondistended small bowel, with associated scattered air-fluid levels. Vascular/Lymphatic: Aortic atherosclerosis. No abdominal aortic aneurysm. No acute-appearing vascular abnormality. No enlarged lymph nodes are seen within the abdomen or pelvis. Reproductive: Uterus and bilateral adnexa are unremarkable. Other: Moderate amount of free fluid in the pelvis. No abscess collection is seen. No free intraperitoneal air. Musculoskeletal: Degenerative spondylosis of the scoliotic thoracolumbar spine, at least moderate in degree. No acute-appearing osseous abnormality. IMPRESSION: 1. Majority of the small and large bowel now demonstrate wall thickening with adjacent fat stranding/inflammation, similar to the appearance on the earlier CT abdomen and pelvis of 05/19/2021 except now there is fluid throughout the small bowel and associated scattered air-fluid levels, now suggesting a more diffuse enterocolitis of infectious or inflammatory nature. On the previous study, this was described as a pancolitis with differential considerations of infection and ulcerative colitis. 2. Moderate amount of free fluid in the pelvis, new compared to the previous CT, again indicating  progression of the infectious or inflammatory process described above. 3. No abscess collection is seen. No free intraperitoneal air. No bowel obstruction 4. Fatty infiltration of the liver. 5. Small hiatal hernia. Surgical changes at the gastroesophageal junction suggest a previous hiatal hernia repair. 6. Degenerative spondylosis of the scoliotic thoracolumbar spine, at least moderate in degree. 7. Aortic atherosclerosis. Electronically Signed   By: SFranki CabotM.D.   On: 11/20/2021 14:14    Pertinent labs & imaging results that were available during my care of the patient were reviewed by me and considered in my medical decision making (see MDM for details).  Medications Ordered in ED Medications  alum & mag hydroxide-simeth (MAALOX/MYLANTA) 200-200-20 MG/5ML suspension 30 mL (30 mLs Oral Given 11/20/21 0857)    And  lidocaine (XYLOCAINE) 2 % viscous mouth solution 15 mL (15 mLs Oral Given 11/20/21 0857)  potassium chloride (KLOR-CON) packet 60 mEq (60 mEq Oral Given 11/20/21 0855)  magnesium sulfate IVPB 2 g 50 mL (0 g Intravenous Stopped 11/20/21 1058)  potassium chloride 10 mEq in 100 mL IVPB (0 mEq Intravenous Stopped 11/20/21 1210)  ondansetron (ZOFRAN) injection 4 mg (4 mg Intravenous Given 11/20/21 0851)  morphine (PF)  4 MG/ML injection 4 mg (4 mg Intravenous Given 11/20/21 0852)  magnesium sulfate IVPB 2 g 50 mL (0 g Intravenous Stopped 11/20/21 1152)  iohexol (OMNIPAQUE) 300 MG/ML solution 100 mL (80 mLs Intravenous Contrast Given 11/20/21 1357)                                                                                                                                     Procedures .Critical Care  Performed by: Cristie Hem, MD Authorized by: Cristie Hem, MD   Critical care provider statement:    Critical care time (minutes):  30   Critical care time was exclusive of:  Separately billable procedures and treating other patients   Critical care was necessary to treat  or prevent imminent or life-threatening deterioration of the following conditions:  Cardiac failure, metabolic crisis and dehydration   Critical care was time spent personally by me on the following activities:  Development of treatment plan with patient or surrogate, discussions with consultants, evaluation of patient's response to treatment, examination of patient, ordering and review of laboratory studies, ordering and review of radiographic studies, ordering and performing treatments and interventions, pulse oximetry, re-evaluation of patient's condition and review of old charts   Care discussed with: admitting provider     (including critical care time)  Medical Decision Making / ED Course   MDM:  61 year old female presenting to the emergency department with nausea, vomiting, diarrhea.  Well-appearing, abdominal exam with very mild tenderness.  EKG notable for prolonged QT.  Differential includes gastritis, gastroenteritis, colitis, doubt pancreatitis with normal lipase.  Doubt cholecystitis without right upper quadrant tenderness no right lower quadrant or left lower quadrant tenderness to suggest appendicitis, diverticulitis.  Was notable for severely low potassium at 2.1.  Will give magnesium, potassium, pain medication, recheck potassium.  If remains persistently low may need hospitalization given how low it is today.  Clinical Course as of 11/20/21 1459  Thu Nov 20, 2021  1216 Dr. Marisue Ivan of cardiology will consult for abnormal ECG and elevated troponin. Will order CT Scan. Discussed with Dr. Rogers Blocker of [facility] who will admit the patient  [WS]  1458 CT scan demonstrates worsening enterocolitis. Unclear cause. Can get further workup as inpatient. Repeat K/Mg reassuring [WS]    Clinical Course User Index [WS] Cristie Hem, MD     Additional history obtained: -External records from outside source obtained and reviewed including: Chart review including previous notes, labs,  imaging, consultation notes   Lab Tests: -I ordered, reviewed, and interpreted labs.   The pertinent results include:   Labs Reviewed  COMPREHENSIVE METABOLIC PANEL - Abnormal; Notable for the following components:      Result Value   Potassium 2.1 (*)    Chloride 97 (*)    BUN 7 (*)    Calcium 7.3 (*)    Total Protein 6.4 (*)    All other components within normal  limits  CBC - Abnormal; Notable for the following components:   WBC 11.0 (*)    RBC 2.53 (*)    Hemoglobin 9.9 (*)    HCT 27.9 (*)    MCV 110.3 (*)    MCH 39.1 (*)    nRBC 0.3 (*)    All other components within normal limits  MAGNESIUM - Abnormal; Notable for the following components:   Magnesium 0.9 (*)    All other components within normal limits  BASIC METABOLIC PANEL - Abnormal; Notable for the following components:   BUN 7 (*)    Calcium 7.1 (*)    All other components within normal limits  MAGNESIUM - Abnormal; Notable for the following components:   Magnesium 3.3 (*)    All other components within normal limits  TROPONIN I (HIGH SENSITIVITY) - Abnormal; Notable for the following components:   Troponin I (High Sensitivity) 25 (*)    All other components within normal limits  TROPONIN I (HIGH SENSITIVITY) - Abnormal; Notable for the following components:   Troponin I (High Sensitivity) 33 (*)    All other components within normal limits  LIPASE, BLOOD  URINALYSIS, ROUTINE W REFLEX MICROSCOPIC  ETHANOL      EKG   EKG Interpretation  Date/Time:  Thursday November 20 2021 08:29:15 EDT Ventricular Rate:  76 PR Interval:  137 QRS Duration: 86 QT Interval:  531 QTC Calculation: 598 R Axis:   55 Text Interpretation: Sinus rhythm Abnormal T, consider ischemia, diffuse leads Prolonged QT interval Confirmed by Garnette Gunner [phone]) on 11/20/2021 8:43:22 AM         Imaging Studies ordered: I ordered imaging studies including CT abd  On my interpretation imaging demonstrates enterocolitis I  independently visualized and interpreted imaging. I agree with the radiologist interpretation   Medicines ordered and prescription drug management: Meds ordered this encounter  Medications   AND Linked Order Group    alum & mag hydroxide-simeth (MAALOX/MYLANTA) 200-200-20 MG/5ML suspension 30 mL    lidocaine (XYLOCAINE) 2 % viscous mouth solution 15 mL   potassium chloride (KLOR-CON) packet 60 mEq   magnesium sulfate IVPB 2 g 50 mL   potassium chloride 10 mEq in 100 mL IVPB   ondansetron (ZOFRAN) injection 4 mg   morphine (PF) 4 MG/ML injection 4 mg   magnesium sulfate IVPB 2 g 50 mL   iohexol (OMNIPAQUE) 300 MG/ML solution 100 mL    -I have reviewed the patients home medicines and have made adjustments as needed   Consultations Obtained: I requested consultation with the cardiologist,  and discussed lab and imaging findings as well as pertinent plan - they recommend: admit, they will consult   Cardiac Monitoring: The patient was maintained on a cardiac monitor.  I personally viewed and interpreted the cardiac monitored which showed an underlying rhythm of: NSR  Social Determinants of Health:  Factors impacting patients care include: alcohol use   Reevaluation: After the interventions noted above, I reevaluated the patient and found that they have improved  Co morbidities that complicate the patient evaluation  Past Medical History:  Diagnosis Date   Alcohol abuse    No alcohol intake since 2001   Allergic rhinitis    Anemia    Arthritis    "back, hands" (01/06/2018)   Carpal tunnel syndrome, right    Chronic lower back pain    MRI of cervical, lumbar spine (all obtained by Dr. Ronnie Derby in 2006)- showing mild DDD with bulges but without disc herniation,  at multiple levels; mild facet joint dz, primarily at L3-4 and L4-5 bilaterally   GERD (gastroesophageal reflux disease)    Heartburn    History of COVID-19 05/2019   History of hiatal hernia    history of    Hyperlipidemia    Hypertension    Left knee pain    s/p ACL reconstruction by Dr. Ronnie Derby   Shoulder pain, bilateral    MRI right shoulder showing tear of infraspinatus tendon with bursal surface fraying of the infraspinatus      Dispostion: Admit    Final Clinical Impression(s) / ED Diagnoses Final diagnoses:  Hypokalemia  Prolonged Q-T interval on ECG  Hypomagnesemia  Enterocolitis  Elevated troponin     This chart was dictated using voice recognition software.  Despite best efforts to proofread,  errors can occur which can change the documentation meaning.    Cristie Hem, MD 11/20/21 1459

## 2021-11-20 NOTE — H&P (Addendum)
History and Physical    Patient: Toni Gutierrez WGY:[id number] DOB: 19-Nov-1960 DOA: 11/20/2021 DOS: the patient was seen and examined on 11/20/2021 PCP: [name]., FNP  Patient coming from: Home  Chief Complaint:  Chief Complaint  Patient presents with   Abdominal Pain   HPI: Toni Gutierrez is a 61 y.o. female with medical history significant of HTN, HLD, hiatal hernia s/p repair, chronic pain syndrome (chronic back pain/arthritis) on narcotics, EtOH use-who presented to the ED earlier this morning with complaints of mid abdominal pain with nausea/vomiting and diarrhea.    Per history obtained-patient was in her usual state of health when she went to bed last night.  Around 4 AM this morning she woke up with mid abdominal pain.  Pain was mostly in the upper abdomen, 6-7/10 at its worst, no radiation, but associated numerous episodes of vomiting and diarrhea.  Vomitus was nonbloody.  Stool was mostly watery-without any blood or mucus in it.  Patient denies any fever.  She denies any recent sick contacts.  She subsequently presented to [facility] where she was found to have severe hypokalemia, hypomagnesemia-CT abdomen showed enteritis colitis.  Hospitalist service was asked then to admit this patient for further evaluation and treatment.  Patient denies any fever, headache, chest pain, shortness of breath.  No history of dysuria, hematuria.  Patient denies any history of chronic diarrhea.    Review of Systems: As mentioned in the history of present illness. All other systems reviewed and are negative. Past Medical History:  Diagnosis Date   Alcohol abuse    No alcohol intake since 2001   Allergic rhinitis    Anemia    Arthritis    "back, hands" (01/06/2018)   Carpal tunnel syndrome, right    Chronic lower back pain    MRI of cervical, lumbar spine (all obtained by Dr. Ronnie Derby in 2006)- showing mild DDD with bulges but without disc herniation, at  multiple levels; mild facet joint dz, primarily at L3-4 and L4-5 bilaterally   GERD (gastroesophageal reflux disease)    Heartburn    History of COVID-19 05/2019   History of hiatal hernia    history of   Hyperlipidemia    Hypertension    Left knee pain    s/p ACL reconstruction by Dr. Ronnie Derby   Shoulder pain, bilateral    MRI right shoulder showing tear of infraspinatus tendon with bursal surface fraying of the infraspinatus   Past Surgical History:  Procedure Laterality Date   ANTERIOR CRUCIATE LIGAMENT REPAIR Left    By Dr. Terrilyn Saver RELEASE Right 02/08/2019   Procedure: CARPAL TUNNEL RELEASE;  Surgeon: Latanya Maudlin, MD;  Location: [facility];  Service: Orthopedics;  Laterality: Right;   CESAREAN SECTION  1986   CHOLECYSTECTOMY N/A 06/25/2020   Procedure: LAPAROSCOPIC CHOLECYSTECTOMY WITH POSSIBLE INTRAOPERATIVE CHOLANGIOGRAM;  Surgeon: Greer Pickerel, MD;  Location: [facility];  Service: General;  Laterality: N/A;   ESOPHAGOGASTRODUODENOSCOPY N/A 07/11/2019   Procedure: UPPER ESOPHAGOGASTRODUODENOSCOPY (EGD);  Surgeon: Greer Pickerel, MD;  Location: [facility];  Service: General;  Laterality: N/A;   HIATAL HERNIA REPAIR N/A 07/11/2019   Procedure: LAPAROSCOPIC REPAIR OF PARAESOPHAGEAL HERNIA WITH TOUPET FUNDOPLICATION AND GASTROPEXY;  Surgeon: Greer Pickerel, MD;  Location: [facility];  Service: General;  Laterality: N/A;   REVERSE SHOULDER ARTHROPLASTY Left 09/19/2020   Procedure: REVERSE SHOULDER ARTHROPLASTY;  Surgeon: Tania Ade, MD;  Location: [facility];  Service: Orthopedics;  Laterality: Left;  SHOULDER ARTHROSCOPY W/ ROTATOR CUFF REPAIR Right    TUBAL LIGATION  1989   Social History:  reports that she has never smoked. She has never used smokeless tobacco. She reports that she does not currently use alcohol after a past usage of about 1.0 standard drink of alcohol per week. She reports that she does not currently use drugs.  No Known Allergies  Family History   Problem Relation Age of Onset   Stroke Mother    Colon cancer Neg Hx    Esophageal cancer Neg Hx    Rectal cancer Neg Hx    Stomach cancer Neg Hx     Prior to Admission medications   Medication Sig Start Date End Date Taking? Authorizing Provider  atorvastatin (LIPITOR) 40 MG tablet Take 40 mg by mouth at bedtime.   Yes [provider]  dicyclomine (BENTYL) 20 MG tablet Take 1 tablet (20 mg total) by mouth 2 (two) times daily. 05/19/21  Yes Wynona Dove A, DO  hydrOXYzine (ATARAX/VISTARIL) 50 MG tablet Take 50-100 mg by mouth at bedtime. 08/07/20  Yes [provider]  loperamide (IMODIUM) 2 MG capsule Take 1 capsule (2 mg total) by mouth 2 (two) times daily as needed for diarrhea or loose stools. 01/19/21  Yes Jaynee Eagles, PA-C  losartan (COZAAR) 50 MG tablet Take 50 mg by mouth daily.   Yes [provider]  mirtazapine (REMERON) 15 MG tablet Take 15 mg by mouth at bedtime. 09/22/21  Yes [provider]  Multiple Vitamins-Minerals (MULTIVITAMIN WITH MINERALS) tablet Take 1 tablet by mouth daily.   Yes [provider]  omeprazole (PRILOSEC) 20 MG capsule Take 2 capsules (40 mg total) by mouth daily. Patient taking differently: Take 20 mg by mouth daily. 03/29/16  Yes Rai, Ripudeep K, MD  oxyCODONE-acetaminophen (PERCOCET) 10-325 MG tablet Take 1 tablet by mouth 4 (four) times daily as needed. 10/29/21  Yes [provider]  predniSONE (DELTASONE) 10 MG tablet Take 3 tablets (30 mg total) by mouth daily with breakfast. 01/19/21  Yes Jaynee Eagles, PA-C  ondansetron (ZOFRAN-ODT) 8 MG disintegrating tablet Take 1 tablet (8 mg total) by mouth every 8 (eight) hours as needed for nausea or vomiting. Patient not taking: Reported on 11/20/2021 01/19/21   Jaynee Eagles, PA-C  sucralfate (CARAFATE) 1 g tablet Take 1 tablet (1 g total) by mouth 4 (four) times daily -  with meals and at bedtime for 7 days. 05/19/21 05/26/21  Jeanell Sparrow, DO    Physical  Exam: Vitals:   11/20/21 1249 11/20/21 1445 11/20/21 1447 11/20/21 1700  BP: (!) 162/71  (!) 176/93 (!) 184/86  Pulse: 81  84 73  Resp: 19  (!) 26 19  Temp:  98.7 F (37.1 C)    TempSrc:  Oral    SpO2: 94%  97% 98%  Weight:      Height:        Gen Exam:Alert awake-not in any distress HEENT:atraumatic, normocephalic Chest: B/L clear to auscultation anteriorly CVS:S1S2 regular Abdomen: Soft-mildly tender in the mid abdominal area-without any rebound or any sort of peritoneal signs.  Not distended. Extremities:no edema Neurology: Non focal Skin: no rash    Data Reviewed:    Latest Ref Rng & Units 11/20/2021    7:40 AM 09/12/2021    9:17 PM 05/19/2021   10:03 AM  CBC  WBC 4.0 - 10.5 K/uL 11.0  5.5  6.0   Hemoglobin 12.0 - 15.0 g/dL 9.9  10.8  9.8  Hematocrit 36.0 - 46.0 % 27.9  30.8  26.8   Platelets 150 - 400 K/uL 191  169  233        Latest Ref Rng & Units 11/20/2021   11:10 AM 11/20/2021    7:40 AM 09/12/2021    9:17 PM  BMP  Glucose 70 - 99 mg/dL 88  92  92   BUN 8 - 23 mg/dL 7  7  8    Creatinine 0.44 - 1.00 mg/dL 0.80  0.85  0.76   Sodium 135 - 145 mmol/L 142  141  141   Potassium 3.5 - 5.1 mmol/L 3.8  2.1  3.2   Chloride 98 - 111 mmol/L 101  97  106   CO2 22 - 32 mmol/L 29  30  28    Calcium 8.9 - 10.3 mg/dL 7.1  7.3  8.3      Assessment and Plan: Abdominal pain with nausea/vomiting/diarrhea CT abdomen showing enteritis/colitis-given sudden onset-symptoms overall suggestive of gastroenteritis/enterocolitis-likely a viral syndrome.  Although prior CT abdomen several months ago showed similar findings-she has no history of chronic diarrhea or chronic abdominal pain to suggest underlying inflammatory bowel disease.  CT abdomen done several months ago was in the setting of abdominal pain/diarrhea as well.  We will manage with supportive care-if diarrhea reoccurs-we will send out stool studies.  We will place on clear liquids and see how she does.  Although she claims to  have had a colonoscopy-I do not see it in the chart, she has had a EGD done several years ago to evaluate a hiatal hernia.  She may need a colonoscopy at some point in time-we will need to discuss with gastroenterology prior to discharge.  Hypokalemia/hypomagnesemia Likely due to GI loss and alcohol use.  Has been repleted while in the ED-follow-up labs are much better.  We will repeat electrolyte panel tomorrow morning.  Hypocalcemia Likely due to hypomagnesemia.  Should improve with correction magnesium levels.  If not-further work-up could be contemplated.  Prolonged QTc Due to electrolyte abnormalities-repeat twelve-lead EKG today.  Minimally elevated troponin She has no chest pain-no shortness of breath.  Doubt of any clinical significance.  EKG changes could be due to hypokalemia.  Repeating twelve-lead EKG now.  ED MD has consulted cardiology as well.  Macrocytic anemia Likely due to EtOH use-check B12/folate/anemia panel with a.m. labs.  HTN BP on the higher side-resume losartan  Chronic pain syndrome/chronic back pain/osteoarthritis Resume usual narcotic regimen  HLD On statin  EtOH abuse Per patient-she was abstinent since 1999-and relapsed few months ago.  Claims she drinks a alcoholic beverage-that has 8% alcohol and had-she approximately drinks around 16 ounces daily.  Denies hard liquor/beer use.  Will need to watch closely for withdrawal symptoms.  Per patient when she quit in 1999-she did have withdrawal symptoms.    Advance Care Planning:   Code Status: Full Code   Consults: Cards consulted by EDP  Family Communication: None at at bedside  Severity of Illness: The appropriate patient status for this patient is OBSERVATION. Observation status is judged to be reasonable and necessary in order to provide the required intensity of service to ensure the patient's safety. The patient's presenting symptoms, physical exam findings, and initial radiographic and laboratory  data in the context of their medical condition is felt to place them at decreased risk for further clinical deterioration. Furthermore, it is anticipated that the patient will be medically stable for discharge from the hospital within 2 midnights of admission.  Author: Oren Binet, MD 11/20/2021 5:12 PM  For on call review [email or web address].

## 2021-11-20 NOTE — Consult Note (Signed)
Cardiology Consultation   Patient ID: Toni Gutierrez MRN: [id number]; DOB: 06/29/1960  Admit date: 11/20/2021 Date of Consult: 11/20/2021  PCP:  [name]., [place] Providers Cardiologist:  None        Patient Profile:   Toni Gutierrez is a 61 y.o. female with a hx of hypertension, hyperlipidemia, chronic pain syndrome, alcohol abuse who is being seen 11/20/2021 for the evaluation of troponin elevation at the request of Dr. Truett Mainland.  History of Present Illness:   Toni Gutierrez is a 61 year old female with the above medical history who presented to the ED today with abdominal pain.  She reports she woke up around 4 AM with pain in her abdomen and associated with multiple episodes of vomiting and diarrhea.  Did not note any blood in vomit or stool.  Given these symptoms, she presented this morning to [place].  Initial vitals notable for BP 152/101, pulse 88, SPO2 100% on room air.  Labs notable for potassium 2.1, creatinine 0.85, magnesium 0.9, troponin 25 > 33, WBC 11, hemoglobin 9.9, platelets 191.  EKG shows sinus rhythm, rate 76, diffuse T wave inversions with prolonged QT (598).  CT abdomen pelvis showed diffuse enterocolitis.  She denies any chest pain.  Echocardiogram 12/2017 showed EF 65 to 10%, grade 1 diastolic dysfunction, mild LVH.   Past Medical History:  Diagnosis Date   Alcohol abuse    No alcohol intake since 2001   Allergic rhinitis    Anemia    Arthritis    "back, hands" (01/06/2018)   Carpal tunnel syndrome, right    Chronic lower back pain    MRI of cervical, lumbar spine (all obtained by Dr. Ronnie Derby in 2006)- showing mild DDD with bulges but without disc herniation, at multiple levels; mild facet joint dz, primarily at L3-4 and L4-5 bilaterally   GERD (gastroesophageal reflux disease)    Heartburn    History of COVID-19 05/2019   History of hiatal hernia    history of   Hyperlipidemia    Hypertension     Left knee pain    s/p ACL reconstruction by Dr. Ronnie Derby   Shoulder pain, bilateral    MRI right shoulder showing tear of infraspinatus tendon with bursal surface fraying of the infraspinatus    Past Surgical History:  Procedure Laterality Date   ANTERIOR CRUCIATE LIGAMENT REPAIR Left    By Dr. Terrilyn Saver RELEASE Right 02/08/2019   Procedure: CARPAL TUNNEL RELEASE;  Surgeon: Latanya Maudlin, MD;  Location: [facility];  Service: Orthopedics;  Laterality: Right;   CESAREAN SECTION  1986   CHOLECYSTECTOMY N/A 06/25/2020   Procedure: LAPAROSCOPIC CHOLECYSTECTOMY WITH POSSIBLE INTRAOPERATIVE CHOLANGIOGRAM;  Surgeon: Greer Pickerel, MD;  [facility];  Service: General;  Laterality: N/A;   ESOPHAGOGASTRODUODENOSCOPY N/A 07/11/2019   Procedure: UPPER ESOPHAGOGASTRODUODENOSCOPY (EGD);  Surgeon: Greer Pickerel, MD;  Location[facility];  Service: General;  Laterality: N/A;   HIATAL HERNIA REPAIR N/A 07/11/2019   Procedure: LAPAROSCOPIC REPAIR OF PARAESOPHAGEAL HERNIA WITH TOUPET FUNDOPLICATION AND GASTROPEXY;  Surgeon: Greer Pickerel, MD;  Location[facility];  Service: General;  Laterality: N/A;   REVERSE SHOULDER ARTHROPLASTY Left 09/19/2020   Procedure: REVERSE SHOULDER ARTHROPLASTY;  Surgeon: Tania Ade, MD;  Location[facility];  Service: Orthopedics;  Laterality: Left;   SHOULDER ARTHROSCOPY W/ ROTATOR CUFF REPAIR Right    TUBAL LIGATION  1989     Inpatient Medications: Scheduled Meds:  atorvastatin  40 mg Oral QHS   enoxaparin (LOVENOX) injection  40 mg Subcutaneous F02O   folic acid  1 mg Oral Daily   losartan  50 mg Oral Daily   multivitamin with minerals  1 tablet Oral Daily   [START ON 11/21/2021] pantoprazole  40 mg Oral Q1200   thiamine  100 mg Oral Daily   Or   thiamine  100 mg Intravenous Daily   Continuous Infusions:  sodium chloride     PRN Meds: acetaminophen **OR** acetaminophen, albuterol, hydrALAZINE, LORazepam **OR** LORazepam,  oxyCODONE-acetaminophen  Allergies:   No Known Allergies  Social History:   Social History   Socioeconomic History   Marital status: Married    Spouse name: Not on file   Number of children: Not on file   Years of education: Not on file   Highest education level: Not on file  Occupational History   Not on file  Tobacco Use   Smoking status: Never   Smokeless tobacco: Never  Vaping Use   Vaping Use: Never used  Substance and Sexual Activity   Alcohol use: Not Currently    Alcohol/week: 1.0 standard drink of alcohol    Types: 1 Glasses of wine per week    Comment: rare   Drug use: Not Currently   Sexual activity: Not Currently    Partners: Male    Birth control/protection: Post-menopausal  Other Topics Concern   Not on file  Social History Narrative   Not on file   Social Determinants of Health   Financial Resource Strain: Not on file  Food Insecurity: Not on file  Transportation Needs: Not on file  Physical Activity: Not on file  Stress: Not on file  Social Connections: Not on file  Intimate Partner Violence: Not on file    Family History:    Family History  Problem Relation Age of Onset   Stroke Mother    Colon cancer Neg Hx    Esophageal cancer Neg Hx    Rectal cancer Neg Hx    Stomach cancer Neg Hx      ROS:  Please see the history of present illness.   All other ROS reviewed and negative.     Physical Exam/Data:   Vitals:   11/20/21 1249 11/20/21 1445 11/20/21 1447 11/20/21 1700  BP: (!) 162/71  (!) 176/93 (!) 184/86  Pulse: 81  84 73  Resp: 19  (!) 26 19  Temp:  98.7 F (37.1 C)    TempSrc:  Oral    SpO2: 94%  97% 98%  Weight:      Height:        Intake/Output Summary (Last 24 hours) at 11/20/2021 1715 Last data filed at 11/20/2021 1455 Gross per 24 hour  Intake 393.96 ml  Output 125 ml  Net 268.96 ml      11/20/2021    7:28 AM 05/19/2021    9:52 AM 09/19/2020   10:38 AM  Last 3 Weights  Weight (lbs) 128 lb 127 lb 124 lb  Weight  (kg) 58.06 kg 57.607 kg 56.246 kg     Body mass index is 25.85 kg/m.  General:  Well nourished, well developed, in no acute distress HEENT: normal Neck: no JVD Cardiac:  normal S1, S2; RRR; no murmur  Lungs:  clear to auscultation bilaterally, no wheezing, rhonchi or rales  Abd: soft, nontende Ext: no edema Musculoskeletal:  No deformities, Skin: warm and dry  Neuro:  no focal abnormalities noted Psych:  Normal affect  EKG:  The EKG was personally reviewed and demonstrates:  sinus rhythm, rate 76, diffuse T wave inversions with prolonged QT (598) Telemetry:  Telemetry was personally reviewed and demonstrates: Sinus rhythm with rate 70s  Relevant CV Studies:   Laboratory Data:  High Sensitivity Troponin:   Recent Labs  Lab 11/20/21 0740 11/20/21 1110  TROPONINIHS 25* 33*     Chemistry Recent Labs  Lab 11/20/21 0740 11/20/21 1110  NA 141 142  K 2.1* 3.8  CL 97* 101  CO2 30 29  GLUCOSE 92 88  BUN 7* 7*  CREATININE 0.85 0.80  CALCIUM 7.3* 7.1*  MG 0.9* 3.3*  GFRNONAA >60 >60  ANIONGAP 14 12    Recent Labs  Lab 11/20/21 0740  PROT 6.4*  ALBUMIN 3.9  AST 37  ALT 12  ALKPHOS 70  BILITOT 0.8   Lipids No results for input(s): "CHOL", "TRIG", "HDL", "LABVLDL", "LDLCALC", "CHOLHDL" in the last 168 hours.  Hematology Recent Labs  Lab 11/20/21 0740  WBC 11.0*  RBC 2.53*  HGB 9.9*  HCT 27.9*  MCV 110.3*  MCH 39.1*  MCHC 35.5  RDW 15.1  PLT 191   Thyroid No results for input(s): "TSH", "FREET4" in the last 168 hours.  BNPNo results for input(s): "BNP", "PROBNP" in the last 168 hours.  DDimer No results for input(s): "DDIMER" in the last 168 hours.   Radiology/Studies:  CT Abdomen Pelvis W Contrast  Result Date: 11/20/2021 CLINICAL DATA:  Abdominal pain.  Nausea, vomiting and diarrhea. EXAM: CT ABDOMEN AND PELVIS WITH CONTRAST TECHNIQUE: Multidetector CT imaging of the abdomen and pelvis was performed using the standard protocol following bolus  administration of intravenous contrast. RADIATION DOSE REDUCTION: This exam was performed according to the departmental dose-optimization program which includes automated exposure control, adjustment of the mA and/or kV according to patient size and/or use of iterative reconstruction technique. CONTRAST:  51m OMNIPAQUE IOHEXOL 300 MG/ML  SOLN COMPARISON:  CT abdomen dated 05/19/2021. FINDINGS: Lower chest: No acute abnormality. Hepatobiliary: Liver is diffusely low in density indicating fatty infiltration. No focal mass or lesion is seen within the liver. Status post cholecystectomy. No bile duct dilatation. Pancreas: Unremarkable. No pancreatic ductal dilatation or surrounding inflammatory changes. Spleen: Normal in size without focal abnormality. Adrenals/Urinary Tract: Adrenal glands appear normal. Kidneys are unremarkable without mass, stone or hydronephrosis. No ureteral or bladder calculi are identified. Bladder is unremarkable. Stomach/Bowel: No dilated large or small bowel loops. Appendix is unremarkable. Small hiatal hernia, with surgical clips at the gastroesophageal junction which may be related to a previous hiatal hernia repair. Stomach is otherwise unremarkable, partially decompressed. Majority of the small and large bowel demonstrate wall thickening with adjacent fat stranding/inflammation. Fluid is present throughout the majority of the nondistended small bowel, with associated scattered air-fluid levels. Vascular/Lymphatic: Aortic atherosclerosis. No abdominal aortic aneurysm. No acute-appearing vascular abnormality. No enlarged lymph nodes are seen within the abdomen or pelvis. Reproductive: Uterus and bilateral adnexa are unremarkable. Other: Moderate amount of free fluid in the pelvis. No abscess collection is seen. No free intraperitoneal air. Musculoskeletal: Degenerative spondylosis of the scoliotic thoracolumbar spine, at least moderate in degree. No acute-appearing osseous abnormality.  IMPRESSION: 1. Majority of the small and large bowel now demonstrate wall thickening with adjacent fat stranding/inflammation, similar to the appearance on the earlier CT abdomen and pelvis of 05/19/2021 except now there is fluid throughout the small bowel and associated scattered air-fluid levels, now suggesting a more diffuse enterocolitis of infectious or inflammatory nature. On the  previous study, this was described as a pancolitis with differential considerations of infection and ulcerative colitis. 2. Moderate amount of free fluid in the pelvis, new compared to the previous CT, again indicating progression of the infectious or inflammatory process described above. 3. No abscess collection is seen. No free intraperitoneal air. No bowel obstruction 4. Fatty infiltration of the liver. 5. Small hiatal hernia. Surgical changes at the gastroesophageal junction suggest a previous hiatal hernia repair. 6. Degenerative spondylosis of the scoliotic thoracolumbar spine, at least moderate in degree. 7. Aortic atherosclerosis. Electronically Signed   By: Franki Cabot M.D.   On: 11/20/2021 14:14     Assessment and Plan:   Troponin elevation: Mild troponin elevation (25 > 33).  Suspect demand ischemia in setting of her acute illness.  Will check echocardiogram  EKG abnormalities: Diffuse T wave inversions and prolonged QT.  Suspect due to hypokalemia, as had severe hypokalemia (2.1) on presentation due to multiple episodes of vomiting and diarrhea.  Will check echocardiogram as above and repeat EKG since potassium repleted  Prolonged QT: QTc 598 on initial EKG.  Suspect due to profound hypokalemia.  Will repeat EKG now that potassium improved  Hypokalemia/hypomagnesemia: K+ 2.1, Mag 0.9 on presentation, likely due to GI losses.  Repletion per primary team  Hypertension: BP elevated, restarted home losartan  Enterocolitis: Presented with abdominal pain and nausea/vomiting/diarrhea, CT shows diffuse  enterocolitis   For questions or updates, please contact [place] Please consult [email or web address] for contact info under    Signed, Donato Heinz, MD  11/20/2021 5:15 PM

## 2021-11-20 NOTE — Progress Notes (Signed)
Plan of Care Note for accepted transfer   Patient: Toni Gutierrez MRN: [id number]   [place]: 11/20/2021  Facility requesting transfer: [facility] Requesting Provider: Dr. Truett Mainland  Reason for transfer: abdominal pain, N/V/D.  Facility course: 61 year old female with medical history significant for HTN, HLD, chronic pain, GERD, alcohol use who presented to ED with complaints of sudden onset abdominal pain associated with N/V/D.   Vitals: afebrile, bp: 152/101, HR: 88, RR: 24, oxygen: 100% on RA Pertinent labs: wbc: 11.1, hgb: 9.9, MCV: 110, potassium: 2.1, mag: .9, troponin 25>33, (has some t wave changes)  No imaging done of abdomen.   Talked to cardiology, Dr. Audie Box, they will consult. Repleted potassium/Mag. Repeat bmp/mag pending.  -Asked about abdominal imaging. He will order a CT scan of her abdomen. Asked if he could check ethanol and give her some protonix as well.   Plan of care: The patient is accepted for admission to Telemetry unit, at [facility]..    Author: Orma Flaming, MD 11/20/2021  Check [email or web address] for on-call coverage.  Nursing staff, Please call [place] number on Amion as soon as patient's arrival, so appropriate admitting provider can evaluate the pt.

## 2021-11-21 ENCOUNTER — Observation Stay (HOSPITAL_BASED_OUTPATIENT_CLINIC_OR_DEPARTMENT_OTHER): Payer: Medicare Other

## 2021-11-21 DIAGNOSIS — R9431 Abnormal electrocardiogram [ECG] [EKG]: Secondary | ICD-10-CM

## 2021-11-21 DIAGNOSIS — K529 Noninfective gastroenteritis and colitis, unspecified: Secondary | ICD-10-CM | POA: Diagnosis not present

## 2021-11-21 DIAGNOSIS — E876 Hypokalemia: Secondary | ICD-10-CM | POA: Diagnosis not present

## 2021-11-21 DIAGNOSIS — R778 Other specified abnormalities of plasma proteins: Secondary | ICD-10-CM | POA: Diagnosis not present

## 2021-11-21 DIAGNOSIS — I10 Essential (primary) hypertension: Secondary | ICD-10-CM | POA: Diagnosis not present

## 2021-11-21 LAB — HIV ANTIBODY (ROUTINE TESTING W REFLEX): HIV Screen 4th Generation wRfx: NONREACTIVE

## 2021-11-21 LAB — COMPREHENSIVE METABOLIC PANEL
ALT: 11 U/L (ref 0–44)
AST: 21 U/L (ref 15–41)
Albumin: 2.5 g/dL — ABNORMAL LOW (ref 3.5–5.0)
Alkaline Phosphatase: 48 U/L (ref 38–126)
Anion gap: 6 (ref 5–15)
BUN: <5 mg/dL — ABNORMAL LOW (ref 8–23)
CO2: 29 mmol/L (ref 22–32)
Calcium: 6.6 mg/dL — ABNORMAL LOW (ref 8.9–10.3)
Chloride: 104 mmol/L (ref 98–111)
Creatinine, Ser: 0.73 mg/dL (ref 0.44–1.00)
GFR, Estimated: >60 mL/min (ref >60)
Glucose, Bld: 83 mg/dL (ref 70–99)
Potassium: 3 mmol/L — ABNORMAL LOW (ref 3.5–5.1)
Sodium: 139 mmol/L (ref 135–145)
Total Bilirubin: 1.1 mg/dL (ref 0.3–1.2)
Total Protein: 4.6 g/dL — ABNORMAL LOW (ref 6.5–8.1)

## 2021-11-21 LAB — CBC
HCT: 21.5 % — ABNORMAL LOW (ref 36.0–46.0)
Hemoglobin: 7.3 g/dL — ABNORMAL LOW (ref 12.0–15.0)
MCH: 39.2 pg — ABNORMAL HIGH (ref 26.0–34.0)
MCHC: 34 g/dL (ref 30.0–36.0)
MCV: 115.6 fL — ABNORMAL HIGH (ref 80.0–100.0)
Platelets: 121 10*3/uL — ABNORMAL LOW (ref 150–400)
RBC: 1.86 MIL/uL — ABNORMAL LOW (ref 3.87–5.11)
RDW: 15.8 % — ABNORMAL HIGH (ref 11.5–15.5)
WBC: 5.3 10*3/uL (ref 4.0–10.5)
nRBC: 0 % (ref 0.0–0.2)

## 2021-11-21 LAB — ECHOCARDIOGRAM COMPLETE
AR max vel: 2.51 cm2
AV Area VTI: 3.12 cm2
AV Area mean vel: 2.54 cm2
AV Mean grad: 7 mmHg
AV Peak grad: 14.3 mmHg
Ao pk vel: 1.89 m/s
Area-P 1/2: 4.39 cm2
Height: 59 in
S' Lateral: 2.7 cm
Weight: 2048 oz

## 2021-11-21 LAB — FOLATE: Folate: 4.5 ng/mL — ABNORMAL LOW (ref >5.9)

## 2021-11-21 LAB — MAGNESIUM: Magnesium: 2 mg/dL (ref 1.7–2.4)

## 2021-11-21 LAB — VITAMIN B12: Vitamin B-12: 189 pg/mL (ref 180–914)

## 2021-11-21 MED ORDER — POTASSIUM CHLORIDE CRYS ER 20 MEQ PO TBCR
40.0000 meq | EXTENDED_RELEASE_TABLET | ORAL | Status: AC
  Administered 2021-11-21 (×2): 40 meq via ORAL
  Filled 2021-11-21 (×2): qty 2

## 2021-11-21 MED ORDER — VITAMIN B-12 1000 MCG PO TABS: 1000.0000 ug | ORAL_TABLET | Freq: Every day | ORAL | 2 refills | Status: AC

## 2021-11-21 MED ORDER — FOLIC ACID 1 MG PO TABS: 1.0000 mg | ORAL_TABLET | Freq: Every day | ORAL | 2 refills | Status: DC

## 2021-11-21 NOTE — Plan of Care (Signed)

## 2021-11-21 NOTE — Progress Notes (Signed)
Rounding Note    Patient Name: Toni Gutierrez Date of Encounter: 11/21/2021  [place] Cardiologist: None   Subjective   Potassium 3.0.  Hgb 7.3.  Reports abdominal pain improved.  Denies chest pain or dyspnea  Inpatient Medications    Scheduled Meds:  atorvastatin  40 mg Oral QHS   enoxaparin (LOVENOX) injection  40 mg Subcutaneous F74B   folic acid  1 mg Oral Daily   losartan  50 mg Oral Daily   multivitamin with minerals  1 tablet Oral Daily   pantoprazole  40 mg Oral Q1200   thiamine  100 mg Oral Daily   Or   thiamine  100 mg Intravenous Daily   Continuous Infusions:  sodium chloride 75 mL/hr at 11/21/21 0800   PRN Meds: acetaminophen **OR** acetaminophen, oxyCODONE **AND** acetaminophen, albuterol, hydrALAZINE, LORazepam **OR** LORazepam   Vital Signs    Vitals:   11/21/21 0000 11/21/21 0400 11/21/21 0800 11/21/21 1200  BP: (!) 111/57 (!) 103/58 (!) 160/84 (!) 183/82  Pulse: 75 68 85 100  Resp:    (!) 99  Temp: 98.8 F (37.1 C) 98.4 F (36.9 C) 98.2 F (36.8 C)   TempSrc: Oral Oral Oral   SpO2: 97% 94% 96% 100%  Weight:      Height:        Intake/Output Summary (Last 24 hours) at 11/21/2021 1240 Last data filed at 11/21/2021 0500 Gross per 24 hour  Intake 809.4 ml  Output 125 ml  Net 684.4 ml      11/20/2021    7:28 AM 05/19/2021    9:52 AM 09/19/2020   10:38 AM  Last 3 Weights  Weight (lbs) 128 lb 127 lb 124 lb  Weight (kg) 58.06 kg 57.607 kg 56.246 kg      Telemetry    NSR - Personally Reviewed  ECG    NSR, nonspecific T wave flattening, Qtc 497 - Personally Reviewed  Physical Exam   GEN: No acute distress.   Neck: No JVD Cardiac: RRR, no murmurs, rubs, or gallops.  Respiratory: Clear to auscultation bilaterally. GI: Soft, nontender, non-distended  MS: No edema; No deformity. Neuro:  Nonfocal  Psych: Normal affect   Labs    High Sensitivity Troponin:   Recent Labs  Lab 11/20/21 0740 11/20/21 1110   TROPONINIHS 25* 33*     Chemistry Recent Labs  Lab 11/20/21 0740 11/20/21 1110 11/20/21 1829 11/21/21 0418  NA 141 142  --  139  K 2.1* 3.8  --  3.0*  CL 97* 101  --  104  CO2 30 29  --  29  GLUCOSE 92 88  --  83  BUN 7* 7*  --  <5*  CREATININE 0.85 0.80 0.73 0.73  CALCIUM 7.3* 7.1*  --  6.6*  MG 0.9* 3.3*  --  2.0  PROT 6.4*  --   --  4.6*  ALBUMIN 3.9  --   --  2.5*  AST 37  --   --  21  ALT 12  --   --  11  ALKPHOS 70  --   --  48  BILITOT 0.8  --   --  1.1  GFRNONAA >60 >60 >60 >60  ANIONGAP 14 12  --  6    Lipids No results for input(s): "CHOL", "TRIG", "HDL", "LABVLDL", "LDLCALC", "CHOLHDL" in the last 168 hours.  Hematology Recent Labs  Lab 11/20/21 0740 11/20/21 1829 11/21/21 0418  WBC 11.0* 8.3 5.3  RBC  2.53* 2.26* 1.86*  HGB 9.9* 8.6* 7.3*  HCT 27.9* 25.4* 21.5*  MCV 110.3* 112.4* 115.6*  MCH 39.1* 38.1* 39.2*  MCHC 35.5 33.9 34.0  RDW 15.1 15.7* 15.8*  PLT 191 179 121*   Thyroid No results for input(s): "TSH", "FREET4" in the last 168 hours.  BNPNo results for input(s): "BNP", "PROBNP" in the last 168 hours.  DDimer No results for input(s): "DDIMER" in the last 168 hours.   Radiology    ECHOCARDIOGRAM COMPLETE  Result Date: 11/21/2021    ECHOCARDIOGRAM REPORT   Patient Name:   Toni Gutierrez Date of Exam: 11/21/2021 Medical Rec #:  [id number]               Height:       59.0 in Accession #:    [id number]              Weight:       128.0 lb Date of Birth:  [birth year]              BSA:          1.526 m Patient Age:    61 years                BP:           103/58 mmHg Patient Gender: F                       HR:           91 bpm. Exam Location:  Inpatient Procedure: 2D Echo, Cardiac Doppler and Color Doppler Indications:    Abnormal ECG  History:        Patient has prior history of Echocardiogram examinations, most                 recent 01/07/2018. Risk Factors:Hypertension and Dyslipidemia.  Sonographer:    Memory Argue Referring Phys: [id number]  [place]  1. Left ventricular ejection fraction, by estimation, is >75%. The left ventricle has hyperdynamic function. The left ventricle has no regional wall motion abnormalities. Left ventricular diastolic parameters were normal.  2. Right ventricular systolic function is normal. The right ventricular size is normal.  3. The mitral valve is normal in structure. Trivial mitral valve regurgitation. No evidence of mitral stenosis.  4. The aortic valve is tricuspid. Aortic valve regurgitation is not visualized. Aortic valve sclerosis is present, with no evidence of aortic valve stenosis.  5. The inferior vena cava is normal in size with greater than 50% respiratory variability, suggesting right atrial pressure of 3 mmHg. Comparison(s): No prior Echocardiogram. FINDINGS  Left Ventricle: Left ventricular ejection fraction, by estimation, is >75%. The left ventricle has hyperdynamic function. The left ventricle has no regional wall motion abnormalities. The left ventricular internal cavity size was normal in size. There is no left ventricular hypertrophy. Left ventricular diastolic parameters were normal. Right Ventricle: The right ventricular size is normal. Right ventricular systolic function is normal. Left Atrium: Left atrial size was normal in size. Right Atrium: Right atrial size was normal in size. Pericardium: There is no evidence of pericardial effusion. Mitral Valve: The mitral valve is normal in structure. Trivial mitral valve regurgitation. No evidence of mitral valve stenosis. Tricuspid Valve: The tricuspid valve is normal in structure. Tricuspid valve regurgitation is trivial. No evidence of tricuspid stenosis. Aortic Valve: The aortic valve is tricuspid. Aortic valve regurgitation is not visualized. Aortic valve sclerosis is present, with no evidence  of aortic valve stenosis. Aortic valve mean gradient measures 7.0 mmHg. Aortic valve peak gradient measures 14.3 mmHg. Aortic valve  area, by VTI measures 3.12 cm. Pulmonic Valve: The pulmonic valve was not well visualized. Pulmonic valve regurgitation is not visualized. No evidence of pulmonic stenosis. Aorta: The aortic root is normal in size and structure. Venous: The inferior vena cava is normal in size with greater than 50% respiratory variability, suggesting right atrial pressure of 3 mmHg. IAS/Shunts: No atrial level shunt detected by color flow Doppler.  LEFT VENTRICLE PLAX 2D LVIDd:         4.00 cm   Diastology LVIDs:         2.70 cm   LV e' medial:    9.64 cm/s LV PW:         0.90 cm   LV E/e' medial:  6.7 LV IVS:        0.90 cm   LV e' lateral:   14.90 cm/s LVOT diam:     2.10 cm   LV E/e' lateral: 4.4 LV SV:         91 LV SV Index:   60 LVOT Area:     3.46 cm  RIGHT VENTRICLE TAPSE (M-mode): 1.7 cm LEFT ATRIUM             Index        RIGHT ATRIUM          Index LA diam:        3.30 cm 2.16 cm/m   RA Area:     8.54 cm LA Vol (A2C):   32.4 ml 21.24 ml/m  RA Volume:   15.10 ml 9.90 ml/m LA Vol (A4C):   37.3 ml 24.45 ml/m LA Biplane Vol: 37.0 ml 24.25 ml/m  AORTIC VALVE AV Area (Vmax):    2.51 cm AV Area (Vmean):   2.54 cm AV Area (VTI):     3.12 cm AV Vmax:           189.00 cm/s AV Vmean:          125.000 cm/s AV VTI:            0.293 m AV Peak Grad:      14.3 mmHg AV Mean Grad:      7.0 mmHg LVOT Vmax:         137.00 cm/s LVOT Vmean:        91.800 cm/s LVOT VTI:          0.264 m LVOT/AV VTI ratio: 0.90  AORTA Ao Root diam: 3.00 cm Ao Asc diam:  2.90 cm MITRAL VALVE MV Area (PHT): 4.39 cm     SHUNTS MV Decel Time: 173 msec     Systemic VTI:  0.26 m MV E velocity: 65.00 cm/s   Systemic Diam: 2.10 cm MV A velocity: 101.00 cm/s MV E/A ratio:  0.64 Kirk Ruths MD Electronically signed by Kirk Ruths MD Signature Date/Time: 11/21/2021/12:23:29 PM    Final    CT Abdomen Pelvis W Contrast  Result Date: 11/20/2021 CLINICAL DATA:  Abdominal pain.  Nausea, vomiting and diarrhea. EXAM: CT ABDOMEN AND PELVIS WITH CONTRAST  TECHNIQUE: Multidetector CT imaging of the abdomen and pelvis was performed using the standard protocol following bolus administration of intravenous contrast. RADIATION DOSE REDUCTION: This exam was performed according to the departmental dose-optimization program which includes automated exposure control, adjustment of the mA and/or kV according to patient size and/or use of iterative reconstruction technique. CONTRAST:  72m OMNIPAQUE  IOHEXOL 300 MG/ML  SOLN COMPARISON:  CT abdomen dated 05/19/2021. FINDINGS: Lower chest: No acute abnormality. Hepatobiliary: Liver is diffusely low in density indicating fatty infiltration. No focal mass or lesion is seen within the liver. Status post cholecystectomy. No bile duct dilatation. Pancreas: Unremarkable. No pancreatic ductal dilatation or surrounding inflammatory changes. Spleen: Normal in size without focal abnormality. Adrenals/Urinary Tract: Adrenal glands appear normal. Kidneys are unremarkable without mass, stone or hydronephrosis. No ureteral or bladder calculi are identified. Bladder is unremarkable. Stomach/Bowel: No dilated large or small bowel loops. Appendix is unremarkable. Small hiatal hernia, with surgical clips at the gastroesophageal junction which may be related to a previous hiatal hernia repair. Stomach is otherwise unremarkable, partially decompressed. Majority of the small and large bowel demonstrate wall thickening with adjacent fat stranding/inflammation. Fluid is present throughout the majority of the nondistended small bowel, with associated scattered air-fluid levels. Vascular/Lymphatic: Aortic atherosclerosis. No abdominal aortic aneurysm. No acute-appearing vascular abnormality. No enlarged lymph nodes are seen within the abdomen or pelvis. Reproductive: Uterus and bilateral adnexa are unremarkable. Other: Moderate amount of free fluid in the pelvis. No abscess collection is seen. No free intraperitoneal air. Musculoskeletal: Degenerative  spondylosis of the scoliotic thoracolumbar spine, at least moderate in degree. No acute-appearing osseous abnormality. IMPRESSION: 1. Majority of the small and large bowel now demonstrate wall thickening with adjacent fat stranding/inflammation, similar to the appearance on the earlier CT abdomen and pelvis of 05/19/2021 except now there is fluid throughout the small bowel and associated scattered air-fluid levels, now suggesting a more diffuse enterocolitis of infectious or inflammatory nature. On the previous study, this was described as a pancolitis with differential considerations of infection and ulcerative colitis. 2. Moderate amount of free fluid in the pelvis, new compared to the previous CT, again indicating progression of the infectious or inflammatory process described above. 3. No abscess collection is seen. No free intraperitoneal air. No bowel obstruction 4. Fatty infiltration of the liver. 5. Small hiatal hernia. Surgical changes at the gastroesophageal junction suggest a previous hiatal hernia repair. 6. Degenerative spondylosis of the scoliotic thoracolumbar spine, at least moderate in degree. 7. Aortic atherosclerosis. Electronically Signed   By: Franki Cabot M.D.   On: 11/20/2021 14:14    Cardiac Studies   Echo 9/29:  1. Left ventricular ejection fraction, by estimation, is >75%. The left  ventricle has hyperdynamic function. The left ventricle has no regional  wall motion abnormalities. Left ventricular diastolic parameters were  normal.   2. Right ventricular systolic function is normal. The right ventricular  size is normal.   3. The mitral valve is normal in structure. Trivial mitral valve  regurgitation. No evidence of mitral stenosis.   4. The aortic valve is tricuspid. Aortic valve regurgitation is not  visualized. Aortic valve sclerosis is present, with no evidence of aortic  valve stenosis.   5. The inferior vena cava is normal in size with greater than 50%  respiratory  variability, suggesting right atrial pressure of 3 mmHg.   Patient Profile     61 y.o. female with medical history significant of HTN, HLD, hiatal hernia s/p repair, chronic pain syndrome (chronic back pain/arthritis) on narcotics, EtOH use-who presented to the ED earlier this morning with complaints of mid abdominal pain with nausea/vomiting and diarrhea.    Assessment & Plan    Troponin elevation: Mild troponin elevation (25 > 33).  Suspect demand ischemia in setting of her acute illness.  Echo shows EF>75% -No further workup recommended at this  time.  Can consider coronary CTA as outpatient once recovers from acute illness to r/o underlying CAD  EKG abnormalities: Diffuse T wave inversions and prolonged QT.  Suspect due to hypokalemia, as had severe hypokalemia (2.1) on presentation due to multiple episodes of vomiting and diarrhea.  Echocardiogram unremarkable as above   Prolonged QT: QTc 598 on initial EKG.  Suspect due to profound hypokalemia.  Repeat EKG with treatment of hypokalemia showed improvement, Qtc 497   Hypokalemia/hypomagnesemia: K+ 2.1, Mag 0.9 on presentation, likely due to GI losses.  Repletion per primary team   Hypertension: BP elevated, restarted home losartan   Enterocolitis: Presented with abdominal pain and nausea/vomiting/diarrhea, CT shows diffuse enterocolitis  [facility] will sign off.   Medication Recommendations:  None Other recommendations (labs, testing, etc):  None Follow up as an outpatient:  Will schedule   For questions or updates, please contact [place] Please consult [email or web address] for contact info under        Signed, Donato Heinz, MD  11/21/2021, 12:40 PM

## 2021-11-21 NOTE — Discharge Summary (Addendum)
PATIENT DETAILS Name: Toni Gutierrez Age: 61 y.o. Sex: female Date of Birth: 1961/02/06 MRN: [id number]. Admitting Physician: Orma Flaming, MD [id number], Malva Limes., FNP  Admit Date: 11/20/2021 Discharge date: 11/21/2021  Recommendations for Outpatient Follow-up:  Follow up with PCP in 1-2 weeks Please obtain CMP/CBC in one week Please ensure follow up with gastroenterology for colonoscopy Continue counseling regarding importance of abstaining from further alcohol use Check B12/Folate levels in 6 weeks  Admitted From:  Home  Disposition: Home   Discharge Condition: fair  CODE STATUS:   Code Status: Full Code   Diet recommendation:  Diet Order             DIET SOFT Room service appropriate? Yes; Fluid consistency: Thin  Diet effective now           Diet - low sodium heart healthy                    Brief Summary: 61 year old female with history of chronic pain syndrome on narcotics, EtOH use, HTN, HLD, s/p hiatal hernia repair-who presented with abdominal pain/nausea/vomiting and diarrhea.  She was thought to have viral enterocolitis-managed with supportive care with significant clinical improvement.  See below for further details.  Brief Hospital Course: Abdominal pain with nausea/vomiting/diarrhea-likely viral enterocolitis: Patient work-up with sudden onset of mid abdominal pain along with nausea, vomiting, diarrhea-she was managed with supportive care.  Symptoms improved rapidly-stool studies were never sent because her diarrhea resolved.  Diet was slowly advanced-by day of discharge-she is tolerating a soft diet.  Note-patient has had prior CT scans with bowel thickening as well-spoke with GI team-they have arranged for outpatient follow-up for a potential colonoscopy-to ensure she does not have inflammatory bowel disease-although she does not have any history of chronic abdominal pain or diarrhea.  Hypokalemia/hypomagnesemia:  Repleted.  Hypocalcemia: Better after correction of hypomagnesemia.  Prolonged QTc: Significantly improved after correction of hypokalemia/hypomagnesemia  Minimal troponin elevation: Likely demand ischemia-no anginal symptoms.  Echo stable.  Per cardiology-Dr. Jaci Standard further work-up required.  Okay for discharge from his point of view.  Macrocytic anemia: Likely due to alcohol use-however could have folate/B12 deficiency.  Although plans were to perform a folate/B12 levels-unfortunately-it was not done-we will order B12/folate levels prior to discharge-PCP to follow.  Addendum: Folic/vit L07 levels low-scripts sent to [facility] pharmacy.PCP to recheck levels in 6 weeks (couldn't get hold of patient-but managed to get hold of daughger Hafsh Fincher-know that scripts were sent to [facility]-I asked to let the patient know)  HLD: Continue statin  HTN: Continue losartan  Chronic pain syndrome/back pain/OA: Follows with pain management-resume usual narcotic regimen  EtOH use: Apparently relapsed after she quit in 1999-counseled extensively.  No withdrawal symptoms at the time of discharge.  BMI: Estimated body mass index is 25.85 kg/m as calculated from the following:   Height as of this encounter: 4' 11"  (1.499 m).   Weight as of this encounter: 58.1 kg.    Discharge Diagnoses:  Principal Problem:   Hypokalemia Active Problems:   Hyperlipidemia   Essential hypertension, malignant   Elevated troponin   Nonspecific abnormal electrocardiogram (ECG) (EKG)   Prolonged QT interval   Hypomagnesemia   Discharge Instructions:  Activity:  As tolerated   Discharge Instructions     Call MD for:  persistant nausea and vomiting   Complete by: As directed    Call MD for:  severe uncontrolled pain   Complete by: As directed    Diet -  low sodium heart healthy   Complete by: As directed    Discharge instructions   Complete by: As directed    Follow with Primary MD  [name]., FNP in 1-2 weeks  Please get a complete blood count and chemistry panel checked by your Primary MD at your next visit, and again as instructed by your Primary MD.  Get Medicines reviewed and adjusted: Please take all your medications with you for your next visit with your Primary MD  Laboratory/radiological data: Please request your Primary MD to go over all hospital tests and procedure/radiological results at the follow up, please ask your Primary MD to get all Hospital records sent to his/her office.  In some cases, they will be blood work, cultures and biopsy results pending at the time of your discharge. Please request that your primary care M.D. follows up on these results.  Also Note the following: If you experience worsening of your admission symptoms, develop shortness of breath, life threatening emergency, suicidal or homicidal thoughts you must seek medical attention immediately by calling 911 or calling your MD immediately  if symptoms less severe.  You must read complete instructions/literature along with all the possible adverse reactions/side effects for all the Medicines you take and that have been prescribed to you. Take any new Medicines after you have completely understood and accpet all the possible adverse reactions/side effects.   Do not drive when taking Pain medications or sleeping medications (Benzodaizepines)  Do not take more than prescribed Pain, Sleep and Anxiety Medications. It is not advisable to combine anxiety,sleep and pain medications without talking with your primary care practitioner  Special Instructions: If you have smoked or chewed Tobacco  in the last 2 yrs please stop smoking, stop any regular Alcohol  and or any Recreational drug use.  Wear Seat belts while driving.  Please note: You were cared for by a hospitalist during your hospital stay. Once you are discharged, your primary care physician will handle any further medical issues. Please note  that NO REFILLS for any discharge medications will be authorized once you are discharged, as it is imperative that you return to your primary care physician (or establish a relationship with a primary care physician if you do not have one) for your post hospital discharge needs so that they can reassess your need for medications and monitor your lab values.   Increase activity slowly   Complete by: As directed       Allergies as of 11/21/2021   No Known Allergies      Medication List     TAKE these medications    atorvastatin 40 MG tablet Commonly known as: LIPITOR Take 40 mg by mouth at bedtime.   cyanocobalamin 1000 MCG tablet Commonly known as: VITAMIN B12 Take 1 tablet (1,000 mcg total) by mouth daily.   dicyclomine 20 MG tablet Commonly known as: BENTYL Take 1 tablet (20 mg total) by mouth 2 (two) times daily.   folic acid 1 MG tablet Commonly known as: FOLVITE Take 1 tablet (1 mg total) by mouth daily.   hydrOXYzine 50 MG tablet Commonly known as: ATARAX Take 50-100 mg by mouth at bedtime.   loperamide 2 MG capsule Commonly known as: IMODIUM Take 1 capsule (2 mg total) by mouth 2 (two) times daily as needed for diarrhea or loose stools.   losartan 50 MG tablet Commonly known as: COZAAR Take 50 mg by mouth daily.   mirtazapine 15 MG tablet Commonly known  as: REMERON Take 15 mg by mouth at bedtime.   multivitamin with minerals tablet Take 1 tablet by mouth daily.   omeprazole 20 MG capsule Commonly known as: PRILOSEC Take 2 capsules (40 mg total) by mouth daily. What changed: how much to take   ondansetron 8 MG disintegrating tablet Commonly known as: ZOFRAN-ODT Take 1 tablet (8 mg total) by mouth every 8 (eight) hours as needed for nausea or vomiting.   oxyCODONE-acetaminophen 10-325 MG tablet Commonly known as: PERCOCET Take 1 tablet by mouth 4 (four) times daily as needed.   predniSONE 10 MG tablet Commonly known as: DELTASONE Take 3 tablets (30  mg total) by mouth daily with breakfast.   sucralfate 1 g tablet Commonly known as: Carafate Take 1 tablet (1 g total) by mouth 4 (four) times daily -  with meals and at bedtime for 7 days.        Follow-up Information     [name]., FNP. Schedule an appointment as soon as possible for a visit in 1 week(s).   Specialty: Family Medicine Contact information: [place] [address] [phone]         Vladimir Crofts, PA-C Follow up on 12/18/2021.   Specialty: Gastroenterology Why: appt at 1:30 am. Please be there 15 minutes prior to your appt Contact information: [address] [address] [phone]                No Known Allergies   Other Procedures/Studies: ECHOCARDIOGRAM COMPLETE  Result Date: 11/21/2021    ECHOCARDIOGRAM REPORT   Patient Name:   [facility] Date of Exam: 11/21/2021 Medical Rec #:  [id number]               Height:       59.0 in Accession #:    [id number]              Weight:       128.0 lb Date of Birth:  [birth year]              BSA:          1.526 m Patient Age:    42 years                BP:           103/58 mmHg Patient Gender: F                       HR:           91 bpm. Exam Location:  Inpatient Procedure: 2D Echo, Cardiac Doppler and Color Doppler Indications:    Abnormal ECG  History:        Patient has prior history of Echocardiogram examinations, most                 recent 01/07/2018. Risk Factors:Hypertension and Dyslipidemia.  Sonographer:    Memory Argue Referring Phys: [id number] [place]  1. Left ventricular ejection fraction, by estimation, is >75%. The left ventricle has hyperdynamic function. The left ventricle has no regional wall motion abnormalities. Left ventricular diastolic parameters were normal.  2. Right ventricular systolic function is normal. The right ventricular size is normal.  3. The mitral valve is normal in structure. Trivial mitral valve  regurgitation. No evidence of mitral stenosis.  4. The aortic valve is tricuspid. Aortic valve regurgitation is not visualized. Aortic valve sclerosis is present, with no evidence  of aortic valve stenosis.  5. The inferior vena cava is normal in size with greater than 50% respiratory variability, suggesting right atrial pressure of 3 mmHg. Comparison(s): No prior Echocardiogram. FINDINGS  Left Ventricle: Left ventricular ejection fraction, by estimation, is >75%. The left ventricle has hyperdynamic function. The left ventricle has no regional wall motion abnormalities. The left ventricular internal cavity size was normal in size. There is no left ventricular hypertrophy. Left ventricular diastolic parameters were normal. Right Ventricle: The right ventricular size is normal. Right ventricular systolic function is normal. Left Atrium: Left atrial size was normal in size. Right Atrium: Right atrial size was normal in size. Pericardium: There is no evidence of pericardial effusion. Mitral Valve: The mitral valve is normal in structure. Trivial mitral valve regurgitation. No evidence of mitral valve stenosis. Tricuspid Valve: The tricuspid valve is normal in structure. Tricuspid valve regurgitation is trivial. No evidence of tricuspid stenosis. Aortic Valve: The aortic valve is tricuspid. Aortic valve regurgitation is not visualized. Aortic valve sclerosis is present, with no evidence of aortic valve stenosis. Aortic valve mean gradient measures 7.0 mmHg. Aortic valve peak gradient measures 14.3 mmHg. Aortic valve area, by VTI measures 3.12 cm. Pulmonic Valve: The pulmonic valve was not well visualized. Pulmonic valve regurgitation is not visualized. No evidence of pulmonic stenosis. Aorta: The aortic root is normal in size and structure. Venous: The inferior vena cava is normal in size with greater than 50% respiratory variability, suggesting right atrial pressure of 3 mmHg. IAS/Shunts: No atrial level shunt detected  by color flow Doppler.  LEFT VENTRICLE PLAX 2D LVIDd:         4.00 cm   Diastology LVIDs:         2.70 cm   LV e' medial:    9.64 cm/s LV PW:         0.90 cm   LV E/e' medial:  6.7 LV IVS:        0.90 cm   LV e' lateral:   14.90 cm/s LVOT diam:     2.10 cm   LV E/e' lateral: 4.4 LV SV:         91 LV SV Index:   60 LVOT Area:     3.46 cm  RIGHT VENTRICLE TAPSE (M-mode): 1.7 cm LEFT ATRIUM             Index        RIGHT ATRIUM          Index LA diam:        3.30 cm 2.16 cm/m   RA Area:     8.54 cm LA Vol (A2C):   32.4 ml 21.24 ml/m  RA Volume:   15.10 ml 9.90 ml/m LA Vol (A4C):   37.3 ml 24.45 ml/m LA Biplane Vol: 37.0 ml 24.25 ml/m  AORTIC VALVE AV Area (Vmax):    2.51 cm AV Area (Vmean):   2.54 cm AV Area (VTI):     3.12 cm AV Vmax:           189.00 cm/s AV Vmean:          125.000 cm/s AV VTI:            0.293 m AV Peak Grad:      14.3 mmHg AV Mean Grad:      7.0 mmHg LVOT Vmax:         137.00 cm/s LVOT Vmean:        91.800 cm/s LVOT VTI:  0.264 m LVOT/AV VTI ratio: 0.90  AORTA Ao Root diam: 3.00 cm Ao Asc diam:  2.90 cm MITRAL VALVE MV Area (PHT): 4.39 cm     SHUNTS MV Decel Time: 173 msec     Systemic VTI:  0.26 m MV E velocity: 65.00 cm/s   Systemic Diam: 2.10 cm MV A velocity: 101.00 cm/s MV E/A ratio:  0.64 Kirk Ruths MD Electronically signed by Kirk Ruths MD Signature Date/Time: 11/21/2021/12:23:29 PM    Final    CT Abdomen Pelvis W Contrast  Result Date: 11/20/2021 CLINICAL DATA:  Abdominal pain.  Nausea, vomiting and diarrhea. EXAM: CT ABDOMEN AND PELVIS WITH CONTRAST TECHNIQUE: Multidetector CT imaging of the abdomen and pelvis was performed using the standard protocol following bolus administration of intravenous contrast. RADIATION DOSE REDUCTION: This exam was performed according to the departmental dose-optimization program which includes automated exposure control, adjustment of the mA and/or kV according to patient size and/or use of iterative reconstruction technique.  CONTRAST:  21m OMNIPAQUE IOHEXOL 300 MG/ML  SOLN COMPARISON:  CT abdomen dated 05/19/2021. FINDINGS: Lower chest: No acute abnormality. Hepatobiliary: Liver is diffusely low in density indicating fatty infiltration. No focal mass or lesion is seen within the liver. Status post cholecystectomy. No bile duct dilatation. Pancreas: Unremarkable. No pancreatic ductal dilatation or surrounding inflammatory changes. Spleen: Normal in size without focal abnormality. Adrenals/Urinary Tract: Adrenal glands appear normal. Kidneys are unremarkable without mass, stone or hydronephrosis. No ureteral or bladder calculi are identified. Bladder is unremarkable. Stomach/Bowel: No dilated large or small bowel loops. Appendix is unremarkable. Small hiatal hernia, with surgical clips at the gastroesophageal junction which may be related to a previous hiatal hernia repair. Stomach is otherwise unremarkable, partially decompressed. Majority of the small and large bowel demonstrate wall thickening with adjacent fat stranding/inflammation. Fluid is present throughout the majority of the nondistended small bowel, with associated scattered air-fluid levels. Vascular/Lymphatic: Aortic atherosclerosis. No abdominal aortic aneurysm. No acute-appearing vascular abnormality. No enlarged lymph nodes are seen within the abdomen or pelvis. Reproductive: Uterus and bilateral adnexa are unremarkable. Other: Moderate amount of free fluid in the pelvis. No abscess collection is seen. No free intraperitoneal air. Musculoskeletal: Degenerative spondylosis of the scoliotic thoracolumbar spine, at least moderate in degree. No acute-appearing osseous abnormality. IMPRESSION: 1. Majority of the small and large bowel now demonstrate wall thickening with adjacent fat stranding/inflammation, similar to the appearance on the earlier CT abdomen and pelvis of 05/19/2021 except now there is fluid throughout the small bowel and associated scattered air-fluid levels,  now suggesting a more diffuse enterocolitis of infectious or inflammatory nature. On the previous study, this was described as a pancolitis with differential considerations of infection and ulcerative colitis. 2. Moderate amount of free fluid in the pelvis, new compared to the previous CT, again indicating progression of the infectious or inflammatory process described above. 3. No abscess collection is seen. No free intraperitoneal air. No bowel obstruction 4. Fatty infiltration of the liver. 5. Small hiatal hernia. Surgical changes at the gastroesophageal junction suggest a previous hiatal hernia repair. 6. Degenerative spondylosis of the scoliotic thoracolumbar spine, at least moderate in degree. 7. Aortic atherosclerosis. Electronically Signed   By: SFranki CabotM.D.   On: 11/20/2021 14:14     TODAY-DAY OF DISCHARGE:  Subjective:   SJenelle Drennontoday has no headache,no chest abdominal pain,no new weakness tingling or numbness, feels much better wants to go home today.   Objective:   Blood pressure (!) 183/82, pulse 100, temperature 98.2 F (  36.8 C), temperature source Oral, resp. rate (!) 99, height 4' 11"  (1.499 m), weight 58.1 kg, last menstrual period 12/25/2010, SpO2 100 %.  Intake/Output Summary (Last 24 hours) at 11/21/2021 1447 Last data filed at 11/21/2021 0500 Gross per 24 hour  Intake 809.4 ml  Output 125 ml  Net 684.4 ml   Filed Weights   11/20/21 0728  Weight: 58.1 kg    Exam: Awake Alert, Oriented *3, No new F.N deficits, Normal affect [place].AT,PERRAL Supple Neck,No JVD, No cervical lymphadenopathy appriciated.  Symmetrical Chest wall movement, Good air movement bilaterally, CTAB RRR,No Gallops,Rubs or new Murmurs, No Parasternal Heave +ve B.Sounds, Abd Soft, Non tender, No organomegaly appriciated, No rebound -guarding or rigidity. No Cyanosis, Clubbing or edema, No new Rash or bruise   PERTINENT RADIOLOGIC STUDIES: ECHOCARDIOGRAM COMPLETE  Result Date:  11/21/2021    ECHOCARDIOGRAM REPORT   Patient Name:   [facility] [place] Date of Exam: 11/21/2021 Medical Rec #:  [id number]               Height:       59.0 in Accession #:    [id number]              Weight:       128.0 lb Date of Birth:  [birth year]              BSA:          1.526 m Patient Age:    24 years                BP:           103/58 mmHg Patient Gender: F                       HR:           91 bpm. Exam Location:  Inpatient Procedure: 2D Echo, Cardiac Doppler and Color Doppler Indications:    Abnormal ECG  History:        Patient has prior history of Echocardiogram examinations, most                 recent 01/07/2018. Risk Factors:Hypertension and Dyslipidemia.  Sonographer:    Memory Argue Referring Phys: [id number] [place]  1. Left ventricular ejection fraction, by estimation, is >75%. The left ventricle has hyperdynamic function. The left ventricle has no regional wall motion abnormalities. Left ventricular diastolic parameters were normal.  2. Right ventricular systolic function is normal. The right ventricular size is normal.  3. The mitral valve is normal in structure. Trivial mitral valve regurgitation. No evidence of mitral stenosis.  4. The aortic valve is tricuspid. Aortic valve regurgitation is not visualized. Aortic valve sclerosis is present, with no evidence of aortic valve stenosis.  5. The inferior vena cava is normal in size with greater than 50% respiratory variability, suggesting right atrial pressure of 3 mmHg. Comparison(s): No prior Echocardiogram. FINDINGS  Left Ventricle: Left ventricular ejection fraction, by estimation, is >75%. The left ventricle has hyperdynamic function. The left ventricle has no regional wall motion abnormalities. The left ventricular internal cavity size was normal in size. There is no left ventricular hypertrophy. Left ventricular diastolic parameters were normal. Right Ventricle: The right ventricular size is normal. Right  ventricular systolic function is normal. Left Atrium: Left atrial size was normal in size. Right Atrium: Right atrial size was normal in size. Pericardium: There is no evidence of pericardial effusion. Mitral Valve: The mitral  valve is normal in structure. Trivial mitral valve regurgitation. No evidence of mitral valve stenosis. Tricuspid Valve: The tricuspid valve is normal in structure. Tricuspid valve regurgitation is trivial. No evidence of tricuspid stenosis. Aortic Valve: The aortic valve is tricuspid. Aortic valve regurgitation is not visualized. Aortic valve sclerosis is present, with no evidence of aortic valve stenosis. Aortic valve mean gradient measures 7.0 mmHg. Aortic valve peak gradient measures 14.3 mmHg. Aortic valve area, by VTI measures 3.12 cm. Pulmonic Valve: The pulmonic valve was not well visualized. Pulmonic valve regurgitation is not visualized. No evidence of pulmonic stenosis. Aorta: The aortic root is normal in size and structure. Venous: The inferior vena cava is normal in size with greater than 50% respiratory variability, suggesting right atrial pressure of 3 mmHg. IAS/Shunts: No atrial level shunt detected by color flow Doppler.  LEFT VENTRICLE PLAX 2D LVIDd:         4.00 cm   Diastology LVIDs:         2.70 cm   LV e' medial:    9.64 cm/s LV PW:         0.90 cm   LV E/e' medial:  6.7 LV IVS:        0.90 cm   LV e' lateral:   14.90 cm/s LVOT diam:     2.10 cm   LV E/e' lateral: 4.4 LV SV:         91 LV SV Index:   60 LVOT Area:     3.46 cm  RIGHT VENTRICLE TAPSE (M-mode): 1.7 cm LEFT ATRIUM             Index        RIGHT ATRIUM          Index LA diam:        3.30 cm 2.16 cm/m   RA Area:     8.54 cm LA Vol (A2C):   32.4 ml 21.24 ml/m  RA Volume:   15.10 ml 9.90 ml/m LA Vol (A4C):   37.3 ml 24.45 ml/m LA Biplane Vol: 37.0 ml 24.25 ml/m  AORTIC VALVE AV Area (Vmax):    2.51 cm AV Area (Vmean):   2.54 cm AV Area (VTI):     3.12 cm AV Vmax:           189.00 cm/s AV Vmean:           125.000 cm/s AV VTI:            0.293 m AV Peak Grad:      14.3 mmHg AV Mean Grad:      7.0 mmHg LVOT Vmax:         137.00 cm/s LVOT Vmean:        91.800 cm/s LVOT VTI:          0.264 m LVOT/AV VTI ratio: 0.90  AORTA Ao Root diam: 3.00 cm Ao Asc diam:  2.90 cm MITRAL VALVE MV Area (PHT): 4.39 cm     SHUNTS MV Decel Time: 173 msec     Systemic VTI:  0.26 m MV E velocity: 65.00 cm/s   Systemic Diam: 2.10 cm MV A velocity: 101.00 cm/s MV E/A ratio:  0.64 Kirk Ruths MD Electronically signed by Kirk Ruths MD Signature Date/Time: 11/21/2021/12:23:29 PM    Final    CT Abdomen Pelvis W Contrast  Result Date: 11/20/2021 CLINICAL DATA:  Abdominal pain.  Nausea, vomiting and diarrhea. EXAM: CT ABDOMEN AND PELVIS WITH CONTRAST TECHNIQUE: Multidetector CT imaging of  the abdomen and pelvis was performed using the standard protocol following bolus administration of intravenous contrast. RADIATION DOSE REDUCTION: This exam was performed according to the departmental dose-optimization program which includes automated exposure control, adjustment of the mA and/or kV according to patient size and/or use of iterative reconstruction technique. CONTRAST:  27m OMNIPAQUE IOHEXOL 300 MG/ML  SOLN COMPARISON:  CT abdomen dated 05/19/2021. FINDINGS: Lower chest: No acute abnormality. Hepatobiliary: Liver is diffusely low in density indicating fatty infiltration. No focal mass or lesion is seen within the liver. Status post cholecystectomy. No bile duct dilatation. Pancreas: Unremarkable. No pancreatic ductal dilatation or surrounding inflammatory changes. Spleen: Normal in size without focal abnormality. Adrenals/Urinary Tract: Adrenal glands appear normal. Kidneys are unremarkable without mass, stone or hydronephrosis. No ureteral or bladder calculi are identified. Bladder is unremarkable. Stomach/Bowel: No dilated large or small bowel loops. Appendix is unremarkable. Small hiatal hernia, with surgical clips at the  gastroesophageal junction which may be related to a previous hiatal hernia repair. Stomach is otherwise unremarkable, partially decompressed. Majority of the small and large bowel demonstrate wall thickening with adjacent fat stranding/inflammation. Fluid is present throughout the majority of the nondistended small bowel, with associated scattered air-fluid levels. Vascular/Lymphatic: Aortic atherosclerosis. No abdominal aortic aneurysm. No acute-appearing vascular abnormality. No enlarged lymph nodes are seen within the abdomen or pelvis. Reproductive: Uterus and bilateral adnexa are unremarkable. Other: Moderate amount of free fluid in the pelvis. No abscess collection is seen. No free intraperitoneal air. Musculoskeletal: Degenerative spondylosis of the scoliotic thoracolumbar spine, at least moderate in degree. No acute-appearing osseous abnormality. IMPRESSION: 1. Majority of the small and large bowel now demonstrate wall thickening with adjacent fat stranding/inflammation, similar to the appearance on the earlier CT abdomen and pelvis of 05/19/2021 except now there is fluid throughout the small bowel and associated scattered air-fluid levels, now suggesting a more diffuse enterocolitis of infectious or inflammatory nature. On the previous study, this was described as a pancolitis with differential considerations of infection and ulcerative colitis. 2. Moderate amount of free fluid in the pelvis, new compared to the previous CT, again indicating progression of the infectious or inflammatory process described above. 3. No abscess collection is seen. No free intraperitoneal air. No bowel obstruction 4. Fatty infiltration of the liver. 5. Small hiatal hernia. Surgical changes at the gastroesophageal junction suggest a previous hiatal hernia repair. 6. Degenerative spondylosis of the scoliotic thoracolumbar spine, at least moderate in degree. 7. Aortic atherosclerosis. Electronically Signed   By: SFranki CabotM.D.    On: 11/20/2021 14:14     PERTINENT LAB RESULTS: CBC: Recent Labs    11/20/21 1829 11/21/21 0418  WBC 8.3 5.3  HGB 8.6* 7.3*  HCT 25.4* 21.5*  PLT 179 121*   CMET CMP     Component Value Date/Time   NA 139 11/21/2021 0418   K 3.0 (L) 11/21/2021 0418   CL 104 11/21/2021 0418   CO2 29 11/21/2021 0418   GLUCOSE 83 11/21/2021 0418   BUN <5 (L) 11/21/2021 0418   CREATININE 0.73 11/21/2021 0418   CREATININE 0.80 03/25/2016 1047   CALCIUM 6.6 (L) 11/21/2021 0418   PROT 4.6 (L) 11/21/2021 0418   ALBUMIN 2.5 (L) 11/21/2021 0418   AST 21 11/21/2021 0418   ALT 11 11/21/2021 0418   ALKPHOS 48 11/21/2021 0418   BILITOT 1.1 11/21/2021 0418   GFRNONAA >60 11/21/2021 0418   GFRNONAA 83 03/25/2016 1047   GFRAA >60 07/12/2019 0409   GFRAA >89 03/25/2016 1047  GFR Estimated Creatinine Clearance: 57.4 mL/min (by C-G formula based on SCr of 0.73 mg/dL). Recent Labs    11/20/21 0740  LIPASE 17   No results for input(s): "CKTOTAL", "CKMB", "CKMBINDEX", "TROPONINI" in the last 72 hours. Invalid input(s): "POCBNP" No results for input(s): "DDIMER" in the last 72 hours. No results for input(s): "HGBA1C" in the last 72 hours. No results for input(s): "CHOL", "HDL", "LDLCALC", "TRIG", "CHOLHDL", "LDLDIRECT" in the last 72 hours. No results for input(s): "TSH", "T4TOTAL", "T3FREE", "THYROIDAB" in the last 72 hours.  Invalid input(s): "FREET3" Recent Labs    11/20/21 1829 11/21/21 0418  VITAMINB12 189  --   FOLATE  --  4.5*   Coags: No results for input(s): "INR" in the last 72 hours.  Invalid input(s): "PT" Microbiology: No results found for this or any previous visit (from the past 240 hour(s)).  FURTHER DISCHARGE INSTRUCTIONS:  Get Medicines reviewed and adjusted: Please take all your medications with you for your next visit with your Primary MD  Laboratory/radiological data: Please request your Primary MD to go over all hospital tests and procedure/radiological  results at the follow up, please ask your Primary MD to get all Hospital records sent to his/her office.  In some cases, they will be blood work, cultures and biopsy results pending at the time of your discharge. Please request that your primary care M.D. goes through all the records of your hospital data and follows up on these results.  Also Note the following: If you experience worsening of your admission symptoms, develop shortness of breath, life threatening emergency, suicidal or homicidal thoughts you must seek medical attention immediately by calling 911 or calling your MD immediately  if symptoms less severe.  You must read complete instructions/literature along with all the possible adverse reactions/side effects for all the Medicines you take and that have been prescribed to you. Take any new Medicines after you have completely understood and accpet all the possible adverse reactions/side effects.   Do not drive when taking Pain medications or sleeping medications (Benzodaizepines)  Do not take more than prescribed Pain, Sleep and Anxiety Medications. It is not advisable to combine anxiety,sleep and pain medications without talking with your primary care practitioner  Special Instructions: If you have smoked or chewed Tobacco  in the last 2 yrs please stop smoking, stop any regular Alcohol  and or any Recreational drug use.  Wear Seat belts while driving.  Please note: You were cared for by a hospitalist during your hospital stay. Once you are discharged, your primary care physician will handle any further medical issues. Please note that NO REFILLS for any discharge medications will be authorized once you are discharged, as it is imperative that you return to your primary care physician (or establish a relationship with a primary care physician if you do not have one) for your post hospital discharge needs so that they can reassess your need for medications and monitor your lab  values.  Total Time spent coordinating discharge including counseling, education and face to face time equals greater than 30 minutes.  SignedOren Binet 11/21/2021 2:47 PM

## 2021-11-21 NOTE — Progress Notes (Signed)
CSW received consult for ETOH use. CSW spoke with patient and her daughter at bedside. She provided permission for daughter to remain present throughout the conversation. CSW asked patient about ETOH use and if she felt it was an issue for her. She stated she did not feel that she needed resources.  Patient's daughter asked CSW if she could have the resources just in case and CSW provided them to her. Patient jokingly stated that her daughters will make her review them but that she was blessed to have them. No other needs identified at this time.   Gilmore Laroche, MSW, [facility]

## 2021-12-16 NOTE — Progress Notes (Deleted)
12/16/2021 Toni Gutierrez [id number] [birth year]  Referring provider: [name]., FNP Primary GI doctor: (Dr. Ardis Hughs)  ASSESSMENT AND PLAN:   There are no diagnoses linked to this encounter.   Patient Care Team: [name]., FNP as PCP - General (Family Medicine)  HISTORY OF PRESENT ILLNESS: 61 y.o. female with a past medical history of remote history of alcohol abuse, chronic pain syndrome on narcotics,, anemia, arthritis, cholelithiasis GERD, history of hiatal hernia, hypertension hyperlipidemia and others listed below presents for evaluation of hospital follow-up.   EGD December 2019 for dyspepsia, GERD.  About one half of the stomach resides in the chest and there appeared to be a paraesophageal component as well.  There was mild gastritis, biopsies were positive for H. pylori.  She was given a 14-day course of Pylera.   Upper GI January 2020 showed a moderately large hiatal hernia.  Prominent reflux up to the thoracic esophagus was noted. 07/19/2018 last in the office by Dr. Ardis Hughs referred to [facility] for evaluation of hiatal hernia and cholelithiasis 07/11/2019 laparoscopic repair of paraesophageal hernia with toupet fundoplication and gastropexy 06/26/2019 lap cholecystectomy with Dr. Redmond Pulling  05/19/2021 ER visit for abdominal pain, nausea vomiting diarrhea.  Had hypokalemia, AST 62, hemoglobin 9.8, normal MCV At that visit patient admitted to drinking alcohol again.  CT showed pancolitis 11/20/2021 hospital admission for abdominal pain, nausea vomiting diarrhea thought likely viral enterocolitis arranged for outpatient follow-up for colonoscopy.  During admission patient also had prolonged QTc corrected after electrolyte imbalance, hypokalemia hypomagnesia and hypocalcemia.  She had macrocytic anemia likely thought due to alcohol, and low B12 and folate, given prescriptions. 11/20/2021 CT abdomen pelvis with contrast abdominal pain nausea vomiting diarrhea  compared to 05/19/2021 demonstrate wall thickening with adjacent fat stranding and inflammation majority of small and large bowel, moderate amount of free fluid in the pelvis, no abscess collection, no free intraperitoneal air or bowel obstruction, fatty infiltration of the liver, small hiatal hernia  She  reports that she has never smoked. She has never used smokeless tobacco. She reports that she does not currently use alcohol after a past usage of about 1.0 standard drink of alcohol per week. She reports that she does not currently use drugs.  Current Medications:   Current Outpatient Medications (Endocrine & Metabolic):    predniSONE (DELTASONE) 10 MG tablet, Take 3 tablets (30 mg total) by mouth daily with breakfast.  Current Outpatient Medications (Cardiovascular):    atorvastatin (LIPITOR) 40 MG tablet, Take 40 mg by mouth at bedtime.   losartan (COZAAR) 50 MG tablet, Take 50 mg by mouth daily.   Current Outpatient Medications (Analgesics):    oxyCODONE-acetaminophen (PERCOCET) 10-325 MG tablet, Take 1 tablet by mouth 4 (four) times daily as needed.  Current Outpatient Medications (Hematological):    cyanocobalamin (VITAMIN B12) 1000 MCG tablet, Take 1 tablet (1,000 mcg total) by mouth daily.   folic acid (FOLVITE) 1 MG tablet, Take 1 tablet (1 mg total) by mouth daily.  Current Outpatient Medications (Other):    dicyclomine (BENTYL) 20 MG tablet, Take 1 tablet (20 mg total) by mouth 2 (two) times daily.   hydrOXYzine (ATARAX/VISTARIL) 50 MG tablet, Take 50-100 mg by mouth at bedtime.   loperamide (IMODIUM) 2 MG capsule, Take 1 capsule (2 mg total) by mouth 2 (two) times daily as needed for diarrhea or loose stools.   mirtazapine (REMERON) 15 MG tablet, Take 15 mg by mouth at bedtime.   Multiple Vitamins-Minerals (MULTIVITAMIN  WITH MINERALS) tablet, Take 1 tablet by mouth daily.   omeprazole (PRILOSEC) 20 MG capsule, Take 2 capsules (40 mg total) by mouth daily. (Patient taking  differently: Take 20 mg by mouth daily.)   ondansetron (ZOFRAN-ODT) 8 MG disintegrating tablet, Take 1 tablet (8 mg total) by mouth every 8 (eight) hours as needed for nausea or vomiting. (Patient not taking: Reported on 11/20/2021)   sucralfate (CARAFATE) 1 g tablet, Take 1 tablet (1 g total) by mouth 4 (four) times daily -  with meals and at bedtime for 7 days.  Medical History:  Past Medical History:  Diagnosis Date   Alcohol abuse    No alcohol intake since 2001   Allergic rhinitis    Anemia    Arthritis    "back, hands" (01/06/2018)   Carpal tunnel syndrome, right    Chronic lower back pain    MRI of cervical, lumbar spine (all obtained by Dr. Ronnie Derby in 2006)- showing mild DDD with bulges but without disc herniation, at multiple levels; mild facet joint dz, primarily at L3-4 and L4-5 bilaterally   GERD (gastroesophageal reflux disease)    Heartburn    History of COVID-19 05/2019   History of hiatal hernia    history of   Hyperlipidemia    Hypertension    Left knee pain    s/p ACL reconstruction by Dr. Ronnie Derby   Shoulder pain, bilateral    MRI right shoulder showing tear of infraspinatus tendon with bursal surface fraying of the infraspinatus   Allergies: No Known Allergies   Surgical History:  She  has a past surgical history that includes Anterior cruciate ligament repair (Left); Shoulder arthroscopy w/ rotator cuff repair (Right); Cesarean section (1986); Tubal ligation (1989); Carpal tunnel release (Right, 02/08/2019); Hiatal hernia repair (N/A, 07/11/2019); Esophagogastroduodenoscopy (N/A, 07/11/2019); Cholecystectomy (N/A, 06/25/2020); and Reverse shoulder arthroplasty (Left, 09/19/2020). Family History:  Her family history includes Stroke in her mother.  REVIEW OF SYSTEMS  : All other systems reviewed and negative except where noted in the History of Present Illness.  PHYSICAL EXAM: LMP 12/25/2010  General:   Pleasant, well developed female in no acute distress Head:    Normocephalic and atraumatic. Eyes:  sclerae anicteric,conjunctive pink  Heart:   {HEART EXAM HEM/ONC:21750} Pulm:  Clear anteriorly; no wheezing Abdomen:   {BlankSingle:19197::"Distended","Ridged","Soft"}, {BlankSingle:19197::"Flat","Obese","Non-distended"} AB, {BlankSingle:19197::"Absent","Hyperactive, tinkling","Hypoactive","Sluggish","Active"} bowel sounds. {actendernessAB:27319} tenderness {anatomy; site abdomen:5010}. {BlankMultiple:19196::"Without guarding","With guarding","Without rebound","With rebound"}, No organomegaly appreciated. Rectal: {acrectalexam:27461} Extremities:  {With/Without:304960234} edema. Msk: Symmetrical without gross deformities. Peripheral pulses intact.  Neurologic:  Alert and  oriented x4;  No focal deficits.  Skin:   Dry and intact without significant lesions or rashes. Psychiatric:  Cooperative. Normal mood and affect.    Vladimir Crofts, PA-C 11:33 AM

## 2021-12-18 ENCOUNTER — Ambulatory Visit: Payer: Medicare Other | Admitting: Physician Assistant

## 2021-12-19 ENCOUNTER — Ambulatory Visit: Payer: Medicare Other | Admitting: Cardiology

## 2022-02-10 ENCOUNTER — Encounter: Payer: Self-pay | Admitting: Cardiology

## 2022-02-10 ENCOUNTER — Ambulatory Visit: Payer: Medicare Other | Attending: Cardiology | Admitting: Cardiology

## 2022-02-10 VITALS — BP 116/68 | HR 95 | Ht 59.0 in | Wt 120.0 lb

## 2022-02-10 DIAGNOSIS — R9431 Abnormal electrocardiogram [ECG] [EKG]: Secondary | ICD-10-CM

## 2022-02-10 DIAGNOSIS — I10 Essential (primary) hypertension: Secondary | ICD-10-CM

## 2022-02-10 DIAGNOSIS — E785 Hyperlipidemia, unspecified: Secondary | ICD-10-CM

## 2022-02-10 DIAGNOSIS — R7989 Other specified abnormal findings of blood chemistry: Secondary | ICD-10-CM | POA: Diagnosis not present

## 2022-02-10 NOTE — Progress Notes (Signed)
Cardiology Office Note:    Date:  02/10/2022   ID:  Toni Gutierrez, DOB Apr 21, 1960, MRN [id number]  PCP:  [name]., FNP  Cardiologist:  None  Electrophysiologist:  None   Referring MD: [name]., FNP   Chief Complaint  Patient presents with   Follow-up   Troponin elevation    History of Present Illness:    Toni Gutierrez is a 61 y.o. female with a hx of hypertension, hyperlipidemia, chronic pain syndrome, alcohol abuse who presents for follow-up.  She was admitted 10/2021 with multiple episodes of vomiting and diarrhea.  Labs notable for mild troponin elevation (25 > 33) and EKG with diffuse T wave inversions with prolonged QT (598).  QT prolongation was suspected due to severe hypokalemia (K2.1) on presentation.  Echocardiogram 11/21/2021 showed EF greater than 08%, normal diastolic function, normal RV function, no significant valvular disease.  Since discharge from the hospital, she reports has been doing well.  Denies any chest pain, dyspnea, lightheadedness, syncope, lower extremity edema, or palpitations.  Does not exercise regularly.   Past Medical History:  Diagnosis Date   Alcohol abuse    No alcohol intake since 2001   Allergic rhinitis    Anemia    Arthritis    "back, hands" (01/06/2018)   Carpal tunnel syndrome, right    Chronic lower back pain    MRI of cervical, lumbar spine (all obtained by Dr. Ronnie Derby in 2006)- showing mild DDD with bulges but without disc herniation, at multiple levels; mild facet joint dz, primarily at L3-4 and L4-5 bilaterally   GERD (gastroesophageal reflux disease)    Heartburn    History of COVID-19 05/2019   History of hiatal hernia    history of   Hyperlipidemia    Hypertension    Left knee pain    s/p ACL reconstruction by Dr. Ronnie Derby   Shoulder pain, bilateral    MRI right shoulder showing tear of infraspinatus tendon with bursal surface fraying of the infraspinatus    Past Surgical History:   Procedure Laterality Date   ANTERIOR CRUCIATE LIGAMENT REPAIR Left    By Dr. Terrilyn Saver RELEASE Right 02/08/2019   Procedure: CARPAL TUNNEL RELEASE;  Surgeon: Latanya Maudlin, MD;  Location: [facility];  Service: Orthopedics;  Laterality: Right;   CESAREAN SECTION  1986   CHOLECYSTECTOMY N/A 06/25/2020   Procedure: LAPAROSCOPIC CHOLECYSTECTOMY WITH POSSIBLE INTRAOPERATIVE CHOLANGIOGRAM;  Surgeon: Greer Pickerel, MD;  Location: [facility];  Service: General;  Laterality: N/A;   ESOPHAGOGASTRODUODENOSCOPY N/A 07/11/2019   Procedure: UPPER ESOPHAGOGASTRODUODENOSCOPY (EGD);  Surgeon: Greer Pickerel, MD;  Location: [facility];  Service: General;  Laterality: N/A;   HIATAL HERNIA REPAIR N/A 07/11/2019   Procedure: LAPAROSCOPIC REPAIR OF PARAESOPHAGEAL HERNIA WITH TOUPET FUNDOPLICATION AND GASTROPEXY;  Surgeon: Greer Pickerel, MD;  Location: [facility];  Service: General;  Laterality: N/A;   REVERSE SHOULDER ARTHROPLASTY Left 09/19/2020   Procedure: REVERSE SHOULDER ARTHROPLASTY;  Surgeon: Tania Ade, MD;  Location: [facility];  Service: Orthopedics;  Laterality: Left;   SHOULDER ARTHROSCOPY W/ ROTATOR CUFF REPAIR Right    TUBAL LIGATION  1989    Current Medications: Current Meds  Medication Sig   atorvastatin (LIPITOR) 40 MG tablet Take 40 mg by mouth at bedtime.   cyanocobalamin (VITAMIN B12) 1000 MCG tablet Take 1 tablet (1,000 mcg total) by mouth daily.   dicyclomine (BENTYL) 20 MG tablet Take 1 tablet (20 mg total) by mouth 2 (two) times  daily.   folic acid (FOLVITE) 1 MG tablet Take 1 tablet (1 mg total) by mouth daily.   hydrOXYzine (ATARAX/VISTARIL) 50 MG tablet Take 50-100 mg by mouth at bedtime.   loperamide (IMODIUM) 2 MG capsule Take 1 capsule (2 mg total) by mouth 2 (two) times daily as needed for diarrhea or loose stools.   losartan (COZAAR) 50 MG tablet Take 50 mg by mouth daily.   mirtazapine (REMERON) 15 MG tablet Take 15 mg by mouth at bedtime.   Multiple  Vitamins-Minerals (MULTIVITAMIN WITH MINERALS) tablet Take 1 tablet by mouth daily.   omeprazole (PRILOSEC) 20 MG capsule Take 2 capsules (40 mg total) by mouth daily. (Patient taking differently: Take 20 mg by mouth daily.)   ondansetron (ZOFRAN-ODT) 8 MG disintegrating tablet Take 1 tablet (8 mg total) by mouth every 8 (eight) hours as needed for nausea or vomiting.   oxyCODONE-acetaminophen (PERCOCET) 10-325 MG tablet Take 1 tablet by mouth 4 (four) times daily as needed.   predniSONE (DELTASONE) 10 MG tablet Take 3 tablets (30 mg total) by mouth daily with breakfast.     Allergies:   Patient has no known allergies.   Social History   Socioeconomic History   Marital status: Married    Spouse name: Not on file   Number of children: Not on file   Years of education: Not on file   Highest education level: Not on file  Occupational History   Not on file  Tobacco Use   Smoking status: Never   Smokeless tobacco: Never  Vaping Use   Vaping Use: Never used  Substance and Sexual Activity   Alcohol use: Not Currently    Alcohol/week: 1.0 standard drink of alcohol    Types: 1 Glasses of wine per week    Comment: rare   Drug use: Not Currently   Sexual activity: Not Currently    Partners: Male    Birth control/protection: Post-menopausal  Other Topics Concern   Not on file  Social History Narrative   Not on file   Social Determinants of Health   Financial Resource Strain: Not on file  Food Insecurity: No Food Insecurity (11/20/2021)   Hunger Vital Sign    Worried About Running Out of Food in the Last Year: Never true    Ran Out of Food in the Last Year: Never true  Transportation Needs: Not on file  Physical Activity: Not on file  Stress: Not on file  Social Connections: Not on file     Family History: The patient's family history includes Stroke in her mother. There is no history of Colon cancer, Esophageal cancer, Rectal cancer, or Stomach cancer.  ROS:   Please see the  history of present illness.     All other systems reviewed and are negative.  EKGs/Labs/Other Studies Reviewed:    The following studies were reviewed today:   EKG:   02/10/2022: Normal sinus rhythm, rate 95, Q wave V1/2, QTc 434  Recent Labs: 11/21/2021: ALT 11; BUN <5; Creatinine, Ser 0.73; Hemoglobin 7.3; Magnesium 2.0; Platelets 121; Potassium 3.0; Sodium 139  Recent Lipid Panel    Component Value Date/Time   CHOL 170 06/11/2015 1049   TRIG 95 06/11/2015 1049   HDL 52 06/11/2015 1049   CHOLHDL 3.3 06/11/2015 1049   VLDL 19 06/11/2015 1049   LDLCALC 99 06/11/2015 1049    Physical Exam:    VS:  BP 116/68 (BP Location: Left Arm, Patient Position: Sitting, Cuff Size: Normal)   Pulse  95   Ht 4' 11"  (1.499 m)   Wt 120 lb (54.4 kg)   LMP 12/25/2010   BMI 24.24 kg/m     Wt Readings from Last 3 Encounters:  02/10/22 120 lb (54.4 kg)  11/20/21 128 lb (58.1 kg)  05/19/21 127 lb (57.6 kg)     GEN:  Well nourished, well developed in no acute distress HEENT: Normal NECK: No JVD; No carotid bruits LYMPHATICS: No lymphadenopathy CARDIAC: RRR, no murmurs, rubs, gallops RESPIRATORY:  Clear to auscultation without rales, wheezing or rhonchi  ABDOMEN: Soft, non-tender, non-distended MUSCULOSKELETAL:  No edema; No deformity  SKIN: Warm and dry NEUROLOGIC:  Alert and oriented x 3 PSYCHIATRIC:  Normal affect   ASSESSMENT:    1. Elevated troponin   2. Nonspecific abnormal electrocardiogram (ECG) (EKG)   3. Prolonged QT interval   4. Essential hypertension   5. Hyperlipidemia, unspecified hyperlipidemia type    PLAN:    Troponin elevation: Mild troponin elevation (25 > 33) during admission 10/2021.  Suspect demand ischemia in setting of her acute illness with enterocolitis.  Echo shows EF>75% -She denies any anginal symptoms.  Hold off on ischemia evaluation at this time   EKG abnormalities: Diffuse T wave inversions and prolonged QT on EKG during admission 10/2021.  Suspect  due to hypokalemia, as had severe hypokalemia (2.1) on presentation due to multiple episodes of vomiting and diarrhea.  Echocardiogram unremarkable as above.  EKG changes have resolved on EKG in clinic today   Prolonged QT: QTc 598 on initial EKG during admission 10/2021.  Suspect due to profound hypokalemia.  Repeat EKG prior to discharge with treatment of hypokalemia showed improvement, Qtc 497.  Has resolved, QTc 434 today   Hypokalemia/hypomagnesemia: K+ 2.1, Mag 0.9 during admission 10/2021, likely due to GI losses.  Will check BMET/magnesium   Hypertension: On losartan 50 mg daily.  Appears controlled.    Hyperlipidemia: on atorvastatin 40 mg daily.  Coronary calcifications noted on recent CT chest.  Check lipid panel   RTC in 1 year   Medication Adjustments/Labs and Tests Ordered: Current medicines are reviewed at length with the patient today.  Concerns regarding medicines are outlined above.  Orders Placed This Encounter  Procedures   Basic metabolic panel   Lipid panel   Magnesium   EKG 12-Lead   No orders of the defined types were placed in this encounter.   Patient Instructions  Medication Instructions:  Your physician recommends that you continue on your current medications as directed. Please refer to the Current Medication list given to you today.  *If you need a refill on your cardiac medications before your next appointment, please call your pharmacy*  Lab Work: BMET, Lipid, Mag today  If you have labs (blood work) drawn today and your tests are completely normal, you will receive your results only by: [place] (if you have MyChart) OR A paper copy in the mail If you have any lab test that is abnormal or we need to change your treatment, we will call you to review the results.  Follow-Up: At [facility], you and your health needs are our priority.  As part of our continuing mission to provide you with exceptional heart care, we have created  designated Provider Care Teams.  These Care Teams include your primary Cardiologist (physician) and Advanced Practice Providers (APPs -  Physician Assistants and Nurse Practitioners) who all work together to provide you with the care you need, when you need it.  We  recommend signing up for the patient portal called "MyChart".  Sign up information is provided on this After Visit Summary.  MyChart is used to connect with patients for Virtual Visits (Telemedicine).  Patients are able to view lab/test results, encounter notes, upcoming appointments, etc.  Non-urgent messages can be sent to your provider as well.   To learn more about what you can do with MyChart, go to [email or web address].    Your next appointment:   12 month(s)  The format for your next appointment:   In Person  Provider:   Dr. Gardiner Rhyme         Signed, Donato Heinz, MD  02/10/2022 10:11 PM    [place]

## 2022-02-10 NOTE — Patient Instructions (Signed)
Medication Instructions:  Your physician recommends that you continue on your current medications as directed. Please refer to the Current Medication list given to you today.  *If you need a refill on your cardiac medications before your next appointment, please call your pharmacy*  Lab Work: BMET, Lipid, Mag today  If you have labs (blood work) drawn today and your tests are completely normal, you will receive your results only by: [place] (if you have MyChart) OR A paper copy in the mail If you have any lab test that is abnormal or we need to change your treatment, we will call you to review the results.  Follow-Up: At [facility], you and your health needs are our priority.  As part of our continuing mission to provide you with exceptional heart care, we have created designated Provider Care Teams.  These Care Teams include your primary Cardiologist (physician) and Advanced Practice Providers (APPs -  Physician Assistants and Nurse Practitioners) who all work together to provide you with the care you need, when you need it.  We recommend signing up for the patient portal called "MyChart".  Sign up information is provided on this After Visit Summary.  MyChart is used to connect with patients for Virtual Visits (Telemedicine).  Patients are able to view lab/test results, encounter notes, upcoming appointments, etc.  Non-urgent messages can be sent to your provider as well.   To learn more about what you can do with MyChart, go to [email or web address].    Your next appointment:   12 month(s)  The format for your next appointment:   In Person  Provider:   Dr. Gardiner Rhyme

## 2022-02-11 ENCOUNTER — Other Ambulatory Visit: Payer: Self-pay | Admitting: *Deleted

## 2022-02-11 DIAGNOSIS — E781 Pure hyperglyceridemia: Secondary | ICD-10-CM

## 2022-02-11 DIAGNOSIS — E785 Hyperlipidemia, unspecified: Secondary | ICD-10-CM

## 2022-02-11 LAB — BASIC METABOLIC PANEL
BUN/Creatinine Ratio: 9 — ABNORMAL LOW (ref 12–28)
BUN: 7 mg/dL — ABNORMAL LOW (ref 8–27)
CO2: 21 mmol/L (ref 20–29)
Calcium: 8.6 mg/dL — ABNORMAL LOW (ref 8.7–10.3)
Chloride: 106 mmol/L (ref 96–106)
Creatinine, Ser: 0.81 mg/dL (ref 0.57–1.00)
Glucose: 90 mg/dL (ref 70–99)
Potassium: 4.2 mmol/L (ref 3.5–5.2)
Sodium: 141 mmol/L (ref 134–144)
eGFR: 82 mL/min/{1.73_m2} (ref >59)

## 2022-02-11 LAB — LIPID PANEL
Chol/HDL Ratio: 1.4 ratio (ref 0.0–4.4)
Cholesterol, Total: 138 mg/dL (ref 100–199)
HDL: 98 mg/dL (ref >39)
Triglycerides: 509 mg/dL — ABNORMAL HIGH (ref 0–149)

## 2022-02-11 LAB — MAGNESIUM: Magnesium: 1.7 mg/dL (ref 1.6–2.3)

## 2022-02-19 ENCOUNTER — Other Ambulatory Visit: Payer: Self-pay | Admitting: *Deleted

## 2022-02-19 DIAGNOSIS — E781 Pure hyperglyceridemia: Secondary | ICD-10-CM

## 2022-02-19 DIAGNOSIS — E785 Hyperlipidemia, unspecified: Secondary | ICD-10-CM

## 2022-02-20 LAB — LIPID PANEL
Chol/HDL Ratio: 1.6 ratio (ref 0.0–4.4)
Cholesterol, Total: 173 mg/dL (ref 100–199)
HDL: 106 mg/dL (ref >39)
LDL Chol Calc (NIH): 51 mg/dL (ref 0–99)
Triglycerides: 94 mg/dL (ref 0–149)
VLDL Cholesterol Cal: 16 mg/dL (ref 5–40)

## 2022-03-06 ENCOUNTER — Encounter (HOSPITAL_COMMUNITY): Payer: Self-pay

## 2022-03-06 ENCOUNTER — Ambulatory Visit (INDEPENDENT_AMBULATORY_CARE_PROVIDER_SITE_OTHER): Payer: 59

## 2022-03-06 ENCOUNTER — Ambulatory Visit (HOSPITAL_COMMUNITY)
Admission: RE | Admit: 2022-03-06 | Discharge: 2022-03-06 | Disposition: A | Discharge status: Home / Self Care | Payer: 59 | Source: Ambulatory Visit | Attending: Physician Assistant | Admitting: Physician Assistant

## 2022-03-06 VITALS — BP 157/84 | HR 84 | Temp 98.5°F | Resp 20

## 2022-03-06 DIAGNOSIS — M7989 Other specified soft tissue disorders: Secondary | ICD-10-CM

## 2022-03-06 DIAGNOSIS — M79604 Pain in right leg: Secondary | ICD-10-CM

## 2022-03-06 MED ORDER — PREDNISONE 20 MG PO TABS: 40.0000 mg | ORAL_TABLET | Freq: Every day | ORAL | 0 refills | Status: AC

## 2022-03-06 NOTE — ED Triage Notes (Signed)
Pt states she has had right upper leg pain for a long time, 6 plus months off and on. She said she has discussed with her PCP. She states that her PCP told her its muscle pain. In the last 2 weeks she has noticed more pain and swelling. She isnt taking any extra meds for the pain. She is on Oxycodone 4 times a day. She said she was given steroids once and it helped but came right back.

## 2022-03-06 NOTE — ED Provider Notes (Signed)
[place]    CSN: [id number] Arrival date & time: 03/06/22  1659      History   Chief Complaint Chief Complaint  Patient presents with   Leg Pain    HPI Toni Gutierrez is a 62 y.o. female.   Patient presents today with a prolonged history of right anterior thigh pain that has worsened in the past several weeks.  She reports that in the past she has been told this is related to muscle injury.  She reports that she has been more active working with her daughter and her grandchildren and wonders if she could have injured something.  She is concerned because she is also developed some swelling.  She reports that pain is rated 8/9, described as aching, no aggravating or alleviating factors identified.  She has tried Tylenol as well as her prescribed oxycodone without improvement of symptoms.  She denies any chest pain, shortness of breath, palpitation, leg swelling.  Denies history of VTE event.  She denies active malignancy, recent COVID-19 infection, hospitalization, immobilization, exogenous hormone use.    Past Medical History:  Diagnosis Date   Alcohol abuse    No alcohol intake since 2001   Allergic rhinitis    Anemia    Arthritis    "back, hands" (01/06/2018)   Carpal tunnel syndrome, right    Chronic lower back pain    MRI of cervical, lumbar spine (all obtained by Dr. Ronnie Derby in 2006)- showing mild DDD with bulges but without disc herniation, at multiple levels; mild facet joint dz, primarily at L3-4 and L4-5 bilaterally   GERD (gastroesophageal reflux disease)    Heartburn    History of COVID-19 05/2019   History of hiatal hernia    history of   Hyperlipidemia    Hypertension    Left knee pain    s/p ACL reconstruction by Dr. Ronnie Derby   Shoulder pain, bilateral    MRI right shoulder showing tear of infraspinatus tendon with bursal surface fraying of the infraspinatus    Patient Active Problem List   Diagnosis Date Noted   Elevated troponin     Nonspecific abnormal electrocardiogram (ECG) (EKG)    Prolonged QT interval    Hypomagnesemia    S/P repair of paraesophageal hernia 07/11/2019   Chronic prescription opiate use 03/25/2016   Essential hypertension, malignant 07/29/2015   Rhinitis, allergic 07/29/2015   GERD (gastroesophageal reflux disease) 11/17/2012   Chronic back pain 11/17/2012   Dental caries 11/17/2012   Sciatica 11/17/2012   Hypokalemia 08/16/2012   IT band syndrome 11/16/2011   Right leg pain 08/31/2011   Mononeuropathy of left upper extremity 07/15/2010   Acute pain of left shoulder 03/02/2007   PAIN, CHRONIC NEC 04/27/2006   Hyperlipidemia 03/02/2006   Alcohol abuse 11/20/2005   CARPAL TUNNEL SYNDROME, RIGHT 11/20/2005   ALLERGIC RHINITIS 11/20/2005    Past Surgical History:  Procedure Laterality Date   ANTERIOR CRUCIATE LIGAMENT REPAIR Left    By Dr. Terrilyn Saver RELEASE Right 02/08/2019   Procedure: CARPAL TUNNEL RELEASE;  Surgeon: Latanya Maudlin, MD;  Location: [facility];  Service: Orthopedics;  Laterality: Right;   CESAREAN SECTION  1986   CHOLECYSTECTOMY N/A 06/25/2020   Procedure: LAPAROSCOPIC CHOLECYSTECTOMY WITH POSSIBLE INTRAOPERATIVE CHOLANGIOGRAM;  Surgeon: Greer Pickerel, MD;  Location: [facility];  Service: General;  Laterality: N/A;   ESOPHAGOGASTRODUODENOSCOPY N/A 07/11/2019   Procedure: UPPER ESOPHAGOGASTRODUODENOSCOPY (EGD);  Surgeon: Greer Pickerel, MD;  Location: [facility];  Service: General;  Laterality: N/A;   HIATAL HERNIA REPAIR N/A 07/11/2019   Procedure: LAPAROSCOPIC REPAIR OF PARAESOPHAGEAL HERNIA WITH TOUPET FUNDOPLICATION AND GASTROPEXY;  Surgeon: Greer Pickerel, MD;  Location: [facility];  Service: General;  Laterality: N/A;   REVERSE SHOULDER ARTHROPLASTY Left 09/19/2020   Procedure: REVERSE SHOULDER ARTHROPLASTY;  Surgeon: Tania Ade, MD;  Location: [facility];  Service: Orthopedics;  Laterality: Left;   SHOULDER ARTHROSCOPY W/ ROTATOR CUFF REPAIR Right    TUBAL  LIGATION  1989    OB History   No obstetric history on file.      Home Medications    Prior to Admission medications   Medication Sig Start Date End Date Taking? Authorizing Provider  atorvastatin (LIPITOR) 40 MG tablet Take 40 mg by mouth at bedtime.   Yes [provider]  cyanocobalamin (VITAMIN B12) 1000 MCG tablet Take 1 tablet (1,000 mcg total) by mouth daily. 11/21/21  Yes Ghimire, Henreitta Leber, MD  dicyclomine (BENTYL) 20 MG tablet Take 1 tablet (20 mg total) by mouth 2 (two) times daily. 05/19/21  Yes Jeanell Sparrow, DO  folic acid (FOLVITE) 1 MG tablet Take 1 tablet (1 mg total) by mouth daily. 11/21/21  Yes Ghimire, Henreitta Leber, MD  hydrOXYzine (ATARAX/VISTARIL) 50 MG tablet Take 50-100 mg by mouth at bedtime. 08/07/20  Yes [provider]  loperamide (IMODIUM) 2 MG capsule Take 1 capsule (2 mg total) by mouth 2 (two) times daily as needed for diarrhea or loose stools. 01/19/21  Yes Jaynee Eagles, PA-C  losartan (COZAAR) 50 MG tablet Take 50 mg by mouth daily.   Yes [provider]  mirtazapine (REMERON) 15 MG tablet Take 15 mg by mouth at bedtime. 09/22/21  Yes [provider]  Multiple Vitamins-Minerals (MULTIVITAMIN WITH MINERALS) tablet Take 1 tablet by mouth daily.   Yes [provider]  omeprazole (PRILOSEC) 20 MG capsule Take 2 capsules (40 mg total) by mouth daily. Patient taking differently: Take 20 mg by mouth daily. 03/29/16  Yes Rai, Ripudeep K, MD  ondansetron (ZOFRAN-ODT) 8 MG disintegrating tablet Take 1 tablet (8 mg total) by mouth every 8 (eight) hours as needed for nausea or vomiting. 01/19/21  Yes Jaynee Eagles, PA-C  oxyCODONE-acetaminophen (PERCOCET) 10-325 MG tablet Take 1 tablet by mouth 4 (four) times daily as needed. 10/29/21  Yes [provider]  predniSONE (DELTASONE) 20 MG tablet Take 2 tablets (40 mg total) by mouth daily for 5 days. 03/06/22 03/11/22 Yes Boysie Bonebrake, Derry Skill, PA-C    Family History Family History   Problem Relation Age of Onset   Stroke Mother    Colon cancer Neg Hx    Esophageal cancer Neg Hx    Rectal cancer Neg Hx    Stomach cancer Neg Hx     Social History Social History   Tobacco Use   Smoking status: Never   Smokeless tobacco: Never  Vaping Use   Vaping Use: Never used  Substance Use Topics   Alcohol use: Not Currently    Alcohol/week: 1.0 standard drink of alcohol    Types: 1 Glasses of wine per week    Comment: rare   Drug use: Not Currently     Allergies   Patient has no known allergies.   Review of Systems Review of Systems  Constitutional:  Positive for activity change. Negative for appetite change, fatigue and fever.  Respiratory:  Negative for cough and shortness of breath.   Cardiovascular:  Positive for leg swelling. Negative for chest pain and palpitations.  Musculoskeletal:  Positive for arthralgias, back pain (chronic) and myalgias.  Neurological:  Negative for weakness and numbness.     Physical Exam Triage Vital Signs ED Triage Vitals  Enc Vitals Group     BP 03/06/22 1724 (!) 157/84     Pulse Rate 03/06/22 1724 84     Resp 03/06/22 1724 20     Temp 03/06/22 1724 98.5 F (36.9 C)     Temp Source 03/06/22 1724 Oral     SpO2 03/06/22 1724 95 %     Weight --      Height --      Head Circumference --      Peak Flow --      Pain Score 03/06/22 1722 8     Pain Loc --      Pain Edu? --      Excl. in [place]? --    No data found.  Updated Vital Signs BP (!) 157/84 (BP Location: Left Arm)   Pulse 84   Temp 98.5 F (36.9 C) (Oral)   Resp 20   LMP 12/25/2010   SpO2 95%   Visual Acuity Right Eye Distance:   Left Eye Distance:   Bilateral Distance:    Right Eye Near:   Left Eye Near:    Bilateral Near:     Physical Exam Vitals reviewed.  Constitutional:      General: She is awake. She is not in acute distress.    Appearance: Normal appearance. She is well-developed. She is not ill-appearing.     Comments: Very pleasant  female appears stated age in no acute distress sitting comfortably in exam room  HENT:     Head: Normocephalic and atraumatic.  Cardiovascular:     Rate and Rhythm: Normal rate and regular rhythm.     Heart sounds: Normal heart sounds, S1 normal and S2 normal. No murmur heard. Pulmonary:     Effort: Pulmonary effort is normal.     Breath sounds: Normal breath sounds. No wheezing, rhonchi or rales.     Comments: Clear to auscultation bilaterally Abdominal:     Palpations: Abdomen is soft.     Tenderness: There is no abdominal tenderness.  Musculoskeletal:     Right upper leg: Swelling and tenderness present. No deformity or bony tenderness.     Right lower leg: No edema.     Left lower leg: No edema.       Legs:     Comments: Pain and swelling noted anterior thigh with no deformity.  Normal active range of motion at hip and knee.  Psychiatric:        Behavior: Behavior is cooperative.      UC Treatments / Results  Labs (all labs ordered are listed, but only abnormal results are displayed) Labs Reviewed - No data to display  EKG   Radiology DG Femur Min 2 Views Right  Result Date: 03/06/2022 CLINICAL DATA:  Right leg pain EXAM: RIGHT FEMUR 2 VIEWS COMPARISON:  None Available. FINDINGS: No acute bony abnormality. Specifically, no fracture, subluxation, or dislocation. Soft tissues are intact. Vascular calcifications noted. IMPRESSION: No acute bony abnormality. Electronically Signed   By: Rolm Baptise M.D.   On: 03/06/2022 18:07    Procedures Procedures (including critical care time)  Medications Ordered in UC Medications - No data to display  Initial Impression / Assessment and Plan / UC Course  I have reviewed the triage vital signs and the nursing notes.  Pertinent labs & imaging  results that were available during my care of the patient were reviewed by me and considered in my medical decision making (see chart for details).     We discussed that symptoms are  likely benign given prolonged history, however, given recent change in character/severity of pain as well as associated swelling x-ray was obtained that showed no acute abnormality.  Will obtain ultrasound and she was instructed to go for this tomorrow as an outpatient to rule out DVT.  We discussed that if she develops any worsening symptoms including increasing pain, increasing swelling, chest pain, shortness of breath, heart racing overnight she needs to go to the emergency room.  She was started on prednisone to help with pain we discussed that this cannot be used regularly given long-term side effects.  She was instructed not to take NSAIDs with this medication and risk of GI bleeding but can use her acetaminophen/oxycodone for additional pain relief.  If her symptoms or not improving and her workup is negative she is to follow-up with sports medicine.  She was given contact information for local provider as they can establish her with physical therapy which we cannot do in urgent care.  Discussed that if she has any changing or worsening symptoms including increasing pain, swelling, chest pain, shortness of breath, weakness, palpitations that she needs to be seen immediately.  Strict return precautions given.  All questions answered to patient satisfaction.  Final Clinical Impressions(s) / UC Diagnoses   Final diagnoses:  Right leg pain  Right leg swelling     Discharge Instructions      Your x-ray was normal.  Please go get ultrasound given your increasing pain and swelling.  We will contact you if this is abnormal.  Start prednisone burst of 40 mg for 5 days.  Do not take NSAIDs with this medication including aspirin, ibuprofen/Advil, naproxen/Aleve.  As we discussed, this medication can have a lot of side effects we cannot use it regularly.  You can take your oxycodone as previously prescribed with this medicine.  Avoid strenuous activity including prolonged walking.  If your symptoms do not  improving please follow-up with sports medicine as you may benefit from physical therapy or other intervention; call them to schedule an appointment.  If anything worsens and you have increasing pain, increasing swelling, shortness of breath, heart racing, weakness you need to be seen immediately.     ED Prescriptions     Medication Sig Dispense Auth. Provider   predniSONE (DELTASONE) 20 MG tablet Take 2 tablets (40 mg total) by mouth daily for 5 days. 10 tablet Sukhraj Esquivias, Derry Skill, PA-C      PDMP not reviewed this encounter.   Terrilee Croak, PA-C 03/06/22 1840

## 2022-03-06 NOTE — Discharge Instructions (Signed)
Your x-ray was normal.  Please go get ultrasound given your increasing pain and swelling.  We will contact you if this is abnormal.  Start prednisone burst of 40 mg for 5 days.  Do not take NSAIDs with this medication including aspirin, ibuprofen/Advil, naproxen/Aleve.  As we discussed, this medication can have a lot of side effects we cannot use it regularly.  You can take your oxycodone as previously prescribed with this medicine.  Avoid strenuous activity including prolonged walking.  If your symptoms do not improving please follow-up with sports medicine as you may benefit from physical therapy or other intervention; call them to schedule an appointment.  If anything worsens and you have increasing pain, increasing swelling, shortness of breath, heart racing, weakness you need to be seen immediately.

## 2022-03-07 ENCOUNTER — Ambulatory Visit (HOSPITAL_COMMUNITY): Admission: RE | Admit: 2022-03-07 | Payer: 59 | Source: Ambulatory Visit

## 2022-03-13 ENCOUNTER — Ambulatory Visit: Payer: Medicare Other | Admitting: Cardiology

## 2022-06-18 ENCOUNTER — Encounter (HOSPITAL_BASED_OUTPATIENT_CLINIC_OR_DEPARTMENT_OTHER): Payer: Self-pay

## 2022-06-18 ENCOUNTER — Other Ambulatory Visit: Payer: Self-pay

## 2022-06-18 ENCOUNTER — Emergency Department (HOSPITAL_BASED_OUTPATIENT_CLINIC_OR_DEPARTMENT_OTHER)
Admission: EM | Admit: 2022-06-18 | Discharge: 2022-06-19 | Disposition: A | Discharge status: Home / Self Care | Payer: 59 | Attending: Emergency Medicine | Admitting: Emergency Medicine

## 2022-06-18 ENCOUNTER — Emergency Department (HOSPITAL_BASED_OUTPATIENT_CLINIC_OR_DEPARTMENT_OTHER): Payer: 59

## 2022-06-18 DIAGNOSIS — R197 Diarrhea, unspecified: Secondary | ICD-10-CM | POA: Diagnosis not present

## 2022-06-18 DIAGNOSIS — I10 Essential (primary) hypertension: Secondary | ICD-10-CM | POA: Insufficient documentation

## 2022-06-18 DIAGNOSIS — R112 Nausea with vomiting, unspecified: Secondary | ICD-10-CM | POA: Insufficient documentation

## 2022-06-18 DIAGNOSIS — R109 Unspecified abdominal pain: Secondary | ICD-10-CM | POA: Insufficient documentation

## 2022-06-18 DIAGNOSIS — R7989 Other specified abnormal findings of blood chemistry: Secondary | ICD-10-CM

## 2022-06-18 DIAGNOSIS — R7401 Elevation of levels of liver transaminase levels: Secondary | ICD-10-CM | POA: Insufficient documentation

## 2022-06-18 DIAGNOSIS — Z8616 Personal history of COVID-19: Secondary | ICD-10-CM | POA: Insufficient documentation

## 2022-06-18 LAB — COMPREHENSIVE METABOLIC PANEL
ALT: 203 U/L — ABNORMAL HIGH (ref 0–44)
AST: 612 U/L — ABNORMAL HIGH (ref 15–41)
Albumin: 4.1 g/dL (ref 3.5–5.0)
Alkaline Phosphatase: 86 U/L (ref 38–126)
Anion gap: 15 (ref 5–15)
BUN: 8 mg/dL (ref 8–23)
CO2: 20 mmol/L — ABNORMAL LOW (ref 22–32)
Calcium: 9.1 mg/dL (ref 8.9–10.3)
Chloride: 108 mmol/L (ref 98–111)
Creatinine, Ser: 1.08 mg/dL — ABNORMAL HIGH (ref 0.44–1.00)
GFR, Estimated: 58 mL/min — ABNORMAL LOW (ref >60)
Glucose, Bld: 118 mg/dL — ABNORMAL HIGH (ref 70–99)
Potassium: 3.7 mmol/L (ref 3.5–5.1)
Sodium: 143 mmol/L (ref 135–145)
Total Bilirubin: 1 mg/dL (ref 0.3–1.2)
Total Protein: 7.5 g/dL (ref 6.5–8.1)

## 2022-06-18 LAB — CBC
HCT: 38.6 % (ref 36.0–46.0)
Hemoglobin: 14.1 g/dL (ref 12.0–15.0)
MCH: 37.9 pg — ABNORMAL HIGH (ref 26.0–34.0)
MCHC: 36.5 g/dL — ABNORMAL HIGH (ref 30.0–36.0)
MCV: 103.8 fL — ABNORMAL HIGH (ref 80.0–100.0)
Platelets: 283 10*3/uL (ref 150–400)
RBC: 3.72 MIL/uL — ABNORMAL LOW (ref 3.87–5.11)
RDW: 13.5 % (ref 11.5–15.5)
WBC: 6.3 10*3/uL (ref 4.0–10.5)
nRBC: 0 % (ref 0.0–0.2)

## 2022-06-18 LAB — LIPASE, BLOOD: Lipase: 38 U/L (ref 11–51)

## 2022-06-18 MED ORDER — ALUM & MAG HYDROXIDE-SIMETH 200-200-20 MG/5ML PO SUSP
30.0000 mL | Freq: Once | ORAL | Status: AC
  Administered 2022-06-19: 30 mL via ORAL
  Filled 2022-06-18: qty 30

## 2022-06-18 MED ORDER — METOCLOPRAMIDE HCL 5 MG/ML IJ SOLN
10.0000 mg | Freq: Once | INTRAMUSCULAR | Status: AC
  Administered 2022-06-19: 10 mg via INTRAVENOUS
  Filled 2022-06-18: qty 2

## 2022-06-18 MED ORDER — LIDOCAINE VISCOUS HCL 2 % MT SOLN
15.0000 mL | Freq: Once | OROMUCOSAL | Status: AC
  Administered 2022-06-19: 15 mL via ORAL
  Filled 2022-06-18: qty 15

## 2022-06-18 MED ORDER — FENTANYL CITRATE PF 50 MCG/ML IJ SOSY
50.0000 ug | PREFILLED_SYRINGE | Freq: Once | INTRAMUSCULAR | Status: AC
  Administered 2022-06-19: 50 ug via INTRAVENOUS
  Filled 2022-06-18: qty 1

## 2022-06-18 MED ORDER — IOHEXOL 300 MG/ML  SOLN
100.0000 mL | Freq: Once | INTRAMUSCULAR | Status: AC | PRN
  Administered 2022-06-18: 70 mL via INTRAVENOUS

## 2022-06-18 NOTE — ED Triage Notes (Signed)
8/10 sharp, mid abdominal pain with nausea, vomited x 1 and diarrhea "at least 10 times today"

## 2022-06-18 NOTE — ED Provider Notes (Signed)
[facility] Provider Note  CSN: [id number] Arrival date & time: 06/18/22 2024  Chief Complaint(s) Abdominal Pain (8/10 mid abdominal pain with nausea, vomited x 1 and diarrhea "at least 10 times today" )  HPI Toni Gutierrez is a 62 y.o. female with a past medical history listed below who presents to the emergency department with 1 day intermittent abdominal discomfort in mid and upper abdomen associated with 1 episode of N/V and several bouts of watery diarrhea.  Patient denies any suspicious food intake, sick contacts, or recent travel.  She denies any urinary symptoms.  Did admit to drinking several alcoholic drinks in the last few days.  The history is provided by the patient.    Past Medical History Past Medical History:  Diagnosis Date   Alcohol abuse    No alcohol intake since 2001   Allergic rhinitis    Anemia    Arthritis    "back, hands" (01/06/2018)   Carpal tunnel syndrome, right    Chronic lower back pain    MRI of cervical, lumbar spine (all obtained by Dr. Sherlean Foot in 2006)- showing mild DDD with bulges but without disc herniation, at multiple levels; mild facet joint dz, primarily at L3-4 and L4-5 bilaterally   GERD (gastroesophageal reflux disease)    Heartburn    History of COVID-19 05/2019   History of hiatal hernia    history of   Hyperlipidemia    Hypertension    Left knee pain    s/p ACL reconstruction by Dr. Sherlean Foot   Shoulder pain, bilateral    MRI right shoulder showing tear of infraspinatus tendon with bursal surface fraying of the infraspinatus   Patient Active Problem List   Diagnosis Date Noted   Elevated troponin    Nonspecific abnormal electrocardiogram (ECG) (EKG)    Prolonged QT interval    Hypomagnesemia    S/P repair of paraesophageal hernia 07/11/2019   Chronic prescription opiate use 03/25/2016   Essential hypertension, malignant 07/29/2015   Rhinitis, allergic 07/29/2015   GERD  (gastroesophageal reflux disease) 11/17/2012   Chronic back pain 11/17/2012   Dental caries 11/17/2012   Sciatica 11/17/2012   Hypokalemia 08/16/2012   IT band syndrome 11/16/2011   Right leg pain 08/31/2011   Mononeuropathy of left upper extremity 07/15/2010   Acute pain of left shoulder 03/02/2007   PAIN, CHRONIC NEC 04/27/2006   Hyperlipidemia 03/02/2006   Alcohol abuse 11/20/2005   CARPAL TUNNEL SYNDROME, RIGHT 11/20/2005   ALLERGIC RHINITIS 11/20/2005   Home Medication(s) Prior to Admission medications   Medication Sig Start Date End Date Taking? Authorizing Provider  ondansetron (ZOFRAN-ODT) 4 MG disintegrating tablet Take 1 tablet (4 mg total) by mouth every 8 (eight) hours as needed for up to 3 days for nausea or vomiting. 06/19/22 06/22/22 Yes Carley Strickling, Amadeo Garnet, MD  atorvastatin (LIPITOR) 40 MG tablet Take 40 mg by mouth at bedtime.    [provider]  cyanocobalamin (VITAMIN B12) 1000 MCG tablet Take 1 tablet (1,000 mcg total) by mouth daily. 11/21/21   Ghimire, Werner Lean, MD  dicyclomine (BENTYL) 20 MG tablet Take 1 tablet (20 mg total) by mouth 2 (two) times daily. 05/19/21   Sloan Leiter, DO  folic acid (FOLVITE) 1 MG tablet Take 1 tablet (1 mg total) by mouth daily. 11/21/21   Ghimire, Werner Lean, MD  hydrOXYzine (ATARAX/VISTARIL) 50 MG tablet Take 50-100 mg by mouth at bedtime. 08/07/20   [provider]  loperamide (IMODIUM)  2 MG capsule Take 1 capsule (2 mg total) by mouth 2 (two) times daily as needed for diarrhea or loose stools. 01/19/21   Wallis Bamberg, PA-C  losartan (COZAAR) 50 MG tablet Take 50 mg by mouth daily.    [provider]  mirtazapine (REMERON) 15 MG tablet Take 15 mg by mouth at bedtime. 09/22/21   [provider]  Multiple Vitamins-Minerals (MULTIVITAMIN WITH MINERALS) tablet Take 1 tablet by mouth daily.    [provider]  omeprazole (PRILOSEC) 20 MG capsule Take 2 capsules (40 mg total) by mouth  daily. Patient taking differently: Take 20 mg by mouth daily. 03/29/16   Rai, Delene Ruffini, MD  oxyCODONE-acetaminophen (PERCOCET) 10-325 MG tablet Take 1 tablet by mouth 4 (four) times daily as needed. 10/29/21   [provider]                                                                                                                                    Allergies Patient has no known allergies.  Review of Systems Review of Systems As noted in HPI  Physical Exam Vital Signs  I have reviewed the triage vital signs BP (!) 139/91 (BP Location: Right Arm)   Pulse (!) 112   Temp 98 F (36.7 C) (Oral)   Resp (!) 22   Ht 4\' 11"  (1.499 m)   Wt 59.9 kg   LMP 12/25/2010   SpO2 98%   BMI 26.66 kg/m   Physical Exam Vitals reviewed.  Constitutional:      General: She is not in acute distress.    Appearance: She is well-developed. She is not diaphoretic.  HENT:     Head: Normocephalic and atraumatic.     Right Ear: External ear normal.     Left Ear: External ear normal.     Nose: Nose normal.  Eyes:     General: No scleral icterus.    Conjunctiva/sclera: Conjunctivae normal.  Neck:     Trachea: Phonation normal.  Cardiovascular:     Rate and Rhythm: Normal rate and regular rhythm.  Pulmonary:     Effort: Pulmonary effort is normal. No respiratory distress.     Breath sounds: No stridor.  Abdominal:     General: There is no distension.     Tenderness: There is no abdominal tenderness. There is no guarding or rebound.  Musculoskeletal:        General: Normal range of motion.     Cervical back: Normal range of motion.  Neurological:     Mental Status: She is alert and oriented to person, place, and time.  Psychiatric:        Behavior: Behavior normal.     ED Results and Treatments Labs (all labs ordered are listed, but only abnormal results are displayed) Labs Reviewed  COMPREHENSIVE METABOLIC PANEL - Abnormal; Notable for the following components:      Result  Value  CO2 20 (*)    Glucose, Bld 118 (*)    Creatinine, Ser 1.08 (*)    AST 612 (*)    ALT 203 (*)    GFR, Estimated 58 (*)    All other components within normal limits  CBC - Abnormal; Notable for the following components:   RBC 3.72 (*)    MCV 103.8 (*)    MCH 37.9 (*)    MCHC 36.5 (*)    All other components within normal limits  HEPATIC FUNCTION PANEL - Abnormal; Notable for the following components:   Total Protein 6.2 (*)    Albumin 3.4 (*)    AST 339 (*)    ALT 143 (*)    Bilirubin, Direct 0.3 (*)    All other components within normal limits  LIPASE, BLOOD  ETHANOL  HEPATITIS PANEL, ACUTE                                                                                                                         EKG  EKG Interpretation  Date/Time:  Friday June 19 2022 03:56:33 EDT Ventricular Rate:  92 PR Interval:  143 QRS Duration: 76 QT Interval:  392 QTC Calculation: 485 R Axis:   67 Text Interpretation: Sinus rhythm Left atrial enlargement Confirmed by Drema Pry [phone]) on 06/19/2022 3:58:37 AM       Radiology CT ABDOMEN PELVIS W CONTRAST  Result Date: 06/19/2022 CLINICAL DATA:  Epigastric pain EXAM: CT ABDOMEN AND PELVIS WITH CONTRAST TECHNIQUE: Multidetector CT imaging of the abdomen and pelvis was performed using the standard protocol following bolus administration of intravenous contrast. RADIATION DOSE REDUCTION: This exam was performed according to the departmental dose-optimization program which includes automated exposure control, adjustment of the mA and/or kV according to patient size and/or use of iterative reconstruction technique. CONTRAST:  70mL OMNIPAQUE IOHEXOL 300 MG/ML  SOLN COMPARISON:  11/20/2021 FINDINGS: Lower chest: No acute abnormality.  Small hiatal hernia. Hepatobiliary: No focal liver abnormality is seen. Status post cholecystectomy. No biliary dilatation. Pancreas: No focal abnormality or ductal dilatation. Spleen: No focal  abnormality.  Normal size. Adrenals/Urinary Tract: No adrenal abnormality. No focal renal abnormality. No stones or hydronephrosis. Urinary bladder is unremarkable. Stomach/Bowel: Normal appendix. Stomach, large and small bowel grossly unremarkable. Vascular/Lymphatic: Aortic atherosclerosis. No evidence of aneurysm or adenopathy. Reproductive: Uterus and adnexa unremarkable.  No mass. Other: No free fluid or free air. Musculoskeletal: Convex leftward scoliosis and associated degenerative changes. No acute bony abnormality. IMPRESSION: No acute findings in the abdomen or pelvis. Small hiatal hernia. Aortic atherosclerosis. Electronically Signed   By: Charlett Nose M.D.   On: 06/19/2022 00:08    Medications Ordered in ED Medications  metoCLOPramide (REGLAN) injection 10 mg (10 mg Intravenous Given 06/19/22 0020)  alum & mag hydroxide-simeth (MAALOX/MYLANTA) 200-200-20 MG/5ML suspension 30 mL (30 mLs Oral Given 06/19/22 0014)    And  lidocaine (XYLOCAINE) 2 % viscous mouth solution 15 mL (15 mLs Oral Given 06/19/22 0014)  fentaNYL (SUBLIMAZE) injection 50 mcg (50 mcg Intravenous Given 06/19/22 0020)  iohexol (OMNIPAQUE) 300 MG/ML solution 100 mL (70 mLs Intravenous Contrast Given 06/18/22 2344)  sodium chloride 0.9 % bolus 1,000 mL (1,000 mLs Intravenous New Bag/Given 06/19/22 0134)                                                                                                                                     Procedures Procedures  (including critical care time)  Medical Decision Making / ED Course  Click here for ABCD2, HEART and other calculators  Medical Decision Making Amount and/or Complexity of Data Reviewed Labs: ordered. Radiology: ordered.  Risk OTC drugs. Prescription drug management.    This patient presents to the ED for concern of abdominal cramping, with nausea, vomiting, diarrhea, this involves an extensive number of treatment options, and is a complaint that carries with it a  high risk of complications and morbidity. The differential diagnosis includes but not limited to gastroenteritis.  Will assess for any electrolyte or metabolic derangements.  Less likely biliary disease given the fact that she is status post cholecystectomy.  Will assess for evidence of pancreatitis.  Also considering gastritis.  Will rule out any other intra-abdominal inflammatory/infectious processes.  Initial intervention:  IV fluids, IV pain medicine, antiemetics and GI cocktail  Work up: [employer] and/or imaging independently interpreted by me) CBC without leukocytosis or anemia Metabolic panel without significant electrolyte derangements or renal sufficiency Elevated AST and ALT. Pattern most suspicious for alcoholic related transaminitis. Possible viral process as well.  No elevated alk phos or bili concerning for biliary obstruction.  No evidence of pancreatitis. EtOH neg CT is and without evidence of serious intra-abdominal inflammatory/infectious process or bowel obstruction.  Reassessment: Pain improved. Tolerating PO Provided with IVF LFTs improved.        Final Clinical Impression(s) / ED Diagnoses Final diagnoses:  Nausea vomiting and diarrhea  Elevated LFTs   The patient appears reasonably screened and/or stabilized for discharge and I doubt any other medical condition or other [facility] requiring further screening, evaluation, or treatment in the ED at this time. I have discussed the findings, Dx and Tx plan with the patient/family who expressed understanding and agree(s) with the plan. Discharge instructions discussed at length. The patient/family was given strict return precautions who verbalized understanding of the instructions. No further questions at time of discharge.  Disposition: Discharge  Condition: Good  ED Discharge Orders          Ordered    ondansetron (ZOFRAN-ODT) 4 MG disintegrating tablet  Every 8 hours PRN        06/19/22 0359               Follow Up: Raymon Mutton., FNP [address] [place] [address] [phone]  Call  to schedule an appointment for close follow up to follow up elevated liver enzymes  This chart was dictated using voice recognition software.  Despite best efforts to proofread,  errors can occur which can change the documentation meaning.    Nira Conn, MD 06/19/22 0400

## 2022-06-19 LAB — HEPATIC FUNCTION PANEL
ALT: 143 U/L — ABNORMAL HIGH (ref 0–44)
AST: 339 U/L — ABNORMAL HIGH (ref 15–41)
Albumin: 3.4 g/dL — ABNORMAL LOW (ref 3.5–5.0)
Alkaline Phosphatase: 63 U/L (ref 38–126)
Bilirubin, Direct: 0.3 mg/dL — ABNORMAL HIGH (ref 0.0–0.2)
Indirect Bilirubin: 0.5 mg/dL (ref 0.3–0.9)
Total Bilirubin: 0.8 mg/dL (ref 0.3–1.2)
Total Protein: 6.2 g/dL — ABNORMAL LOW (ref 6.5–8.1)

## 2022-06-19 LAB — ETHANOL: Alcohol, Ethyl (B): <10 mg/dL (ref <10)

## 2022-06-19 LAB — HEPATITIS PANEL, ACUTE
HCV Ab: NONREACTIVE
Hep A IgM: NONREACTIVE
Hep B C IgM: NONREACTIVE
Hepatitis B Surface Ag: NONREACTIVE

## 2022-06-19 MED ORDER — ONDANSETRON 4 MG PO TBDP: 4.0000 mg | ORAL_TABLET | Freq: Three times a day (TID) | ORAL | 0 refills | Status: AC | PRN

## 2022-06-19 MED ORDER — SODIUM CHLORIDE 0.9 % IV BOLUS
1000.0000 mL | Freq: Once | INTRAVENOUS | Status: AC
  Administered 2022-06-19: 1000 mL via INTRAVENOUS

## 2022-06-30 ENCOUNTER — Other Ambulatory Visit: Payer: Self-pay | Admitting: Family

## 2022-06-30 DIAGNOSIS — Z7189 Other specified counseling: Secondary | ICD-10-CM

## 2022-06-30 DIAGNOSIS — R748 Abnormal levels of other serum enzymes: Secondary | ICD-10-CM

## 2022-07-29 ENCOUNTER — Other Ambulatory Visit: Payer: 59

## 2022-08-25 ENCOUNTER — Other Ambulatory Visit: Payer: 59

## 2022-09-04 ENCOUNTER — Other Ambulatory Visit: Payer: Self-pay | Admitting: Family

## 2022-09-04 DIAGNOSIS — Z1231 Encounter for screening mammogram for malignant neoplasm of breast: Secondary | ICD-10-CM

## 2022-09-11 ENCOUNTER — Ambulatory Visit
Admission: RE | Admit: 2022-09-11 | Discharge: 2022-09-11 | Disposition: A | Discharge status: Home / Self Care | Payer: 59 | Source: Ambulatory Visit | Attending: Family | Admitting: Family

## 2022-09-11 DIAGNOSIS — Z7189 Other specified counseling: Secondary | ICD-10-CM

## 2022-09-11 DIAGNOSIS — R748 Abnormal levels of other serum enzymes: Secondary | ICD-10-CM

## 2022-10-05 ENCOUNTER — Ambulatory Visit: Payer: 59

## 2022-11-18 ENCOUNTER — Other Ambulatory Visit: Payer: Self-pay | Admitting: Family

## 2022-11-18 DIAGNOSIS — G8929 Other chronic pain: Secondary | ICD-10-CM

## 2022-12-06 ENCOUNTER — Other Ambulatory Visit: Payer: 59

## 2022-12-15 ENCOUNTER — Encounter (HOSPITAL_BASED_OUTPATIENT_CLINIC_OR_DEPARTMENT_OTHER): Payer: Self-pay | Admitting: Radiology

## 2022-12-15 ENCOUNTER — Observation Stay (HOSPITAL_BASED_OUTPATIENT_CLINIC_OR_DEPARTMENT_OTHER)
Admission: EM | Admit: 2022-12-15 | Discharge: 2022-12-17 | Disposition: A | Discharge status: Home / Self Care | Payer: 59 | Attending: Internal Medicine | Admitting: Internal Medicine

## 2022-12-15 ENCOUNTER — Other Ambulatory Visit: Payer: Self-pay

## 2022-12-15 ENCOUNTER — Emergency Department (HOSPITAL_BASED_OUTPATIENT_CLINIC_OR_DEPARTMENT_OTHER): Payer: 59

## 2022-12-15 DIAGNOSIS — R197 Diarrhea, unspecified: Secondary | ICD-10-CM | POA: Diagnosis present

## 2022-12-15 DIAGNOSIS — I10 Essential (primary) hypertension: Secondary | ICD-10-CM

## 2022-12-15 DIAGNOSIS — Z789 Other specified health status: Secondary | ICD-10-CM | POA: Diagnosis not present

## 2022-12-15 DIAGNOSIS — R9431 Abnormal electrocardiogram [ECG] [EKG]: Secondary | ICD-10-CM | POA: Diagnosis present

## 2022-12-15 DIAGNOSIS — N179 Acute kidney failure, unspecified: Principal | ICD-10-CM | POA: Diagnosis present

## 2022-12-15 DIAGNOSIS — Z8616 Personal history of COVID-19: Secondary | ICD-10-CM | POA: Diagnosis not present

## 2022-12-15 DIAGNOSIS — Z79899 Other long term (current) drug therapy: Secondary | ICD-10-CM | POA: Insufficient documentation

## 2022-12-15 DIAGNOSIS — E785 Hyperlipidemia, unspecified: Secondary | ICD-10-CM | POA: Diagnosis present

## 2022-12-15 DIAGNOSIS — F1090 Alcohol use, unspecified, uncomplicated: Secondary | ICD-10-CM

## 2022-12-15 DIAGNOSIS — Z7982 Long term (current) use of aspirin: Secondary | ICD-10-CM | POA: Diagnosis not present

## 2022-12-15 DIAGNOSIS — Z96612 Presence of left artificial shoulder joint: Secondary | ICD-10-CM | POA: Diagnosis not present

## 2022-12-15 DIAGNOSIS — R8271 Bacteriuria: Secondary | ICD-10-CM

## 2022-12-15 DIAGNOSIS — E876 Hypokalemia: Secondary | ICD-10-CM | POA: Diagnosis present

## 2022-12-15 LAB — CBC WITH DIFFERENTIAL/PLATELET
Abs Immature Granulocytes: 0.05 10*3/uL (ref 0.00–0.07)
Basophils Absolute: 0 10*3/uL (ref 0.0–0.1)
Basophils Relative: 0 %
Eosinophils Absolute: 0 10*3/uL (ref 0.0–0.5)
Eosinophils Relative: 0 %
HCT: 39.2 % (ref 36.0–46.0)
Hemoglobin: 13.9 g/dL (ref 12.0–15.0)
Immature Granulocytes: 0 %
Lymphocytes Relative: 21 %
Lymphs Abs: 2.8 10*3/uL (ref 0.7–4.0)
MCH: 38.1 pg — ABNORMAL HIGH (ref 26.0–34.0)
MCHC: 35.5 g/dL (ref 30.0–36.0)
MCV: 107.4 fL — ABNORMAL HIGH (ref 80.0–100.0)
Monocytes Absolute: 1 10*3/uL (ref 0.1–1.0)
Monocytes Relative: 8 %
Neutro Abs: 9.5 10*3/uL — ABNORMAL HIGH (ref 1.7–7.7)
Neutrophils Relative %: 71 %
Platelets: 278 10*3/uL (ref 150–400)
RBC: 3.65 MIL/uL — ABNORMAL LOW (ref 3.87–5.11)
RDW: 15.9 % — ABNORMAL HIGH (ref 11.5–15.5)
WBC: 13.5 10*3/uL — ABNORMAL HIGH (ref 4.0–10.5)
nRBC: 0.7 % — ABNORMAL HIGH (ref 0.0–0.2)

## 2022-12-15 LAB — URINALYSIS, ROUTINE W REFLEX MICROSCOPIC
Glucose, UA: NEGATIVE mg/dL
Ketones, ur: 15 mg/dL — AB
Nitrite: NEGATIVE
Protein, ur: 100 mg/dL — AB
Specific Gravity, Urine: 1.029 (ref 1.005–1.030)
pH: 5.5 (ref 5.0–8.0)

## 2022-12-15 LAB — MAGNESIUM: Magnesium: 1.2 mg/dL — ABNORMAL LOW (ref 1.7–2.4)

## 2022-12-15 LAB — COMPREHENSIVE METABOLIC PANEL
ALT: 23 U/L (ref 0–44)
AST: 43 U/L — ABNORMAL HIGH (ref 15–41)
Albumin: 4.9 g/dL (ref 3.5–5.0)
Alkaline Phosphatase: 87 U/L (ref 38–126)
Anion gap: 13 (ref 5–15)
BUN: 11 mg/dL (ref 8–23)
CO2: 22 mmol/L (ref 22–32)
Calcium: 9.5 mg/dL (ref 8.9–10.3)
Chloride: 106 mmol/L (ref 98–111)
Creatinine, Ser: 1.87 mg/dL — ABNORMAL HIGH (ref 0.44–1.00)
GFR, Estimated: 30 mL/min — ABNORMAL LOW (ref >60)
Glucose, Bld: 114 mg/dL — ABNORMAL HIGH (ref 70–99)
Potassium: 3.5 mmol/L (ref 3.5–5.1)
Sodium: 141 mmol/L (ref 135–145)
Total Bilirubin: 1.4 mg/dL — ABNORMAL HIGH (ref 0.3–1.2)
Total Protein: 8.5 g/dL — ABNORMAL HIGH (ref 6.5–8.1)

## 2022-12-15 LAB — BASIC METABOLIC PANEL
Anion gap: 9 (ref 5–15)
BUN: 15 mg/dL (ref 8–23)
CO2: 17 mmol/L — ABNORMAL LOW (ref 22–32)
Calcium: 8.1 mg/dL — ABNORMAL LOW (ref 8.9–10.3)
Chloride: 115 mmol/L — ABNORMAL HIGH (ref 98–111)
Creatinine, Ser: 1.52 mg/dL — ABNORMAL HIGH (ref 0.44–1.00)
GFR, Estimated: 39 mL/min — ABNORMAL LOW (ref >60)
Glucose, Bld: 98 mg/dL (ref 70–99)
Potassium: 3.2 mmol/L — ABNORMAL LOW (ref 3.5–5.1)
Sodium: 141 mmol/L (ref 135–145)

## 2022-12-15 LAB — PHOSPHORUS: Phosphorus: 3.6 mg/dL (ref 2.5–4.6)

## 2022-12-15 LAB — LIPASE, BLOOD: Lipase: 36 U/L (ref 11–51)

## 2022-12-15 MED ORDER — LORAZEPAM 1 MG PO TABS: 1.0000 mg | ORAL_TABLET | ORAL | Status: DC | PRN

## 2022-12-15 MED ORDER — HYDRALAZINE HCL 50 MG PO TABS
50.0000 mg | ORAL_TABLET | Freq: Every morning | ORAL | Status: DC
  Administered 2022-12-16 – 2022-12-17 (×2): 50 mg via ORAL
  Filled 2022-12-15 (×2): qty 1

## 2022-12-15 MED ORDER — ACETAMINOPHEN 325 MG PO TABS: 650.0000 mg | ORAL_TABLET | Freq: Four times a day (QID) | ORAL | Status: DC | PRN

## 2022-12-15 MED ORDER — MORPHINE SULFATE (PF) 4 MG/ML IV SOLN
4.0000 mg | Freq: Once | INTRAVENOUS | Status: AC
  Administered 2022-12-15: 4 mg via INTRAVENOUS
  Filled 2022-12-15: qty 1

## 2022-12-15 MED ORDER — POTASSIUM CHLORIDE 10 MEQ/100ML IV SOLN: 10.0000 meq | INTRAVENOUS | Status: DC

## 2022-12-15 MED ORDER — DEXTROSE IN LACTATED RINGERS 5 % IV SOLN: INTRAVENOUS | Status: DC

## 2022-12-15 MED ORDER — OXYCODONE HCL 5 MG PO TABS
5.0000 mg | ORAL_TABLET | ORAL | Status: DC | PRN
  Administered 2022-12-16: 5 mg via ORAL
  Filled 2022-12-15 (×2): qty 1

## 2022-12-15 MED ORDER — SODIUM CHLORIDE 0.9 % IV BOLUS
1000.0000 mL | Freq: Once | INTRAVENOUS | Status: AC
  Administered 2022-12-15: 1000 mL via INTRAVENOUS

## 2022-12-15 MED ORDER — THIAMINE HCL 100 MG/ML IJ SOLN
100.0000 mg | Freq: Every day | INTRAMUSCULAR | Status: DC
  Filled 2022-12-15: qty 2

## 2022-12-15 MED ORDER — FOLIC ACID 1 MG PO TABS
1.0000 mg | ORAL_TABLET | Freq: Every day | ORAL | Status: DC
  Administered 2022-12-15 – 2022-12-17 (×3): 1 mg via ORAL
  Filled 2022-12-15 (×3): qty 1

## 2022-12-15 MED ORDER — LORAZEPAM 2 MG/ML IJ SOLN: 1.0000 mg | INTRAMUSCULAR | Status: DC | PRN

## 2022-12-15 MED ORDER — DICYCLOMINE HCL 10 MG/ML IM SOLN
20.0000 mg | Freq: Once | INTRAMUSCULAR | Status: AC
  Administered 2022-12-15: 20 mg via INTRAMUSCULAR
  Filled 2022-12-15: qty 2

## 2022-12-15 MED ORDER — PROCHLORPERAZINE EDISYLATE 10 MG/2ML IJ SOLN: 5.0000 mg | INTRAMUSCULAR | Status: DC | PRN

## 2022-12-15 MED ORDER — HEPARIN SODIUM (PORCINE) 5000 UNIT/ML IJ SOLN
5000.0000 [IU] | Freq: Three times a day (TID) | INTRAMUSCULAR | Status: DC
Start: 2022-12-15 — End: 2022-12-17
  Administered 2022-12-15 – 2022-12-17 (×6): 5000 [IU] via SUBCUTANEOUS
  Filled 2022-12-15 (×6): qty 1

## 2022-12-15 MED ORDER — THIAMINE MONONITRATE 100 MG PO TABS
100.0000 mg | ORAL_TABLET | Freq: Every day | ORAL | Status: DC
  Administered 2022-12-15 – 2022-12-17 (×3): 100 mg via ORAL
  Filled 2022-12-15 (×3): qty 1

## 2022-12-15 MED ORDER — ADULT MULTIVITAMIN W/MINERALS CH
1.0000 | ORAL_TABLET | Freq: Every day | ORAL | Status: DC
  Administered 2022-12-15 – 2022-12-17 (×3): 1 via ORAL
  Filled 2022-12-15 (×3): qty 1

## 2022-12-15 MED ORDER — ACETAMINOPHEN 650 MG RE SUPP: 650.0000 mg | Freq: Four times a day (QID) | RECTAL | Status: DC | PRN

## 2022-12-15 MED ORDER — TRAZODONE HCL 50 MG PO TABS
50.0000 mg | ORAL_TABLET | Freq: Every day | ORAL | Status: DC
  Administered 2022-12-15 – 2022-12-16 (×2): 50 mg via ORAL
  Filled 2022-12-15 (×2): qty 1

## 2022-12-15 NOTE — ED Notes (Signed)
Pt reports inability to provide urine at this time

## 2022-12-15 NOTE — ED Notes (Signed)
Pt ambulated with standby assist to restroom ?

## 2022-12-15 NOTE — ED Notes (Signed)
Pt ambulatory with steady gait, standby assist to restroom. 

## 2022-12-15 NOTE — ED Notes (Signed)
[facility] called for transport

## 2022-12-15 NOTE — ED Notes (Signed)
Patient attempted to obtain urine sample. Emptied bladder but was unsuccessful in obtain sample due to diarrhea.

## 2022-12-15 NOTE — ED Triage Notes (Signed)
Patient presents to ED via POV from home. Here with abdominal pain and diarrhea. Denies nausea or vomiting. Symptoms present since Sunday.

## 2022-12-15 NOTE — ED Provider Notes (Signed)
[place] EMERGENCY DEPARTMENT AT [facility] Provider Note   CSN: [id number] Arrival date & time: 12/15/22  1191     History  Chief Complaint  Patient presents with   Abdominal Pain    Toni Gutierrez is a 62 y.o. female with a past medical history significant for hypertension, hyperlipidemia, alcohol abuse, GERD, and anemia who presents to the ED due to upper abdominal pain associated with nausea, vomiting, diarrhea x 3 days.  She admits to numerous episodes of nonbloody diarrhea.  Had 1 episode of nonbloody, nonbilious emesis yesterday after taking her medications.  No chest pain or shortness of breath.  Patient seen at urgent care prior to arrival and sent to the ED for further evaluation.  Patient admits to chronic alcohol use.  Admits to drinking two smashes a day which includes alcohol. Had one yesterday. Pain is worse when eating or drinking. Unable to tolerate po. No recent antibiotics or travel.   History obtained from patient and past medical records. No interpreter used during encounter.       Home Medications Prior to Admission medications   Medication Sig Start Date End Date Taking? Authorizing Provider  atorvastatin (LIPITOR) 40 MG tablet Take 40 mg by mouth at bedtime.   Yes [provider]  cetirizine (ZYRTEC) 10 MG tablet Take 10 mg by mouth every morning. 12/07/22  Yes [provider]  cyanocobalamin (VITAMIN B12) 1000 MCG tablet Take 1 tablet (1,000 mcg total) by mouth daily. 11/21/21  Yes Ghimire, Werner Lean, MD  hydrALAZINE (APRESOLINE) 50 MG tablet Take 50 mg by mouth every morning. Blood pressure 12/07/22  Yes [provider]  loperamide (IMODIUM) 2 MG capsule Take 1 capsule (2 mg total) by mouth 2 (two) times daily as needed for diarrhea or loose stools. 01/19/21  Yes Wallis Bamberg, PA-C  losartan (COZAAR) 100 MG tablet Take 100 mg by mouth every morning. 12/07/22  Yes [provider]  mirtazapine (REMERON) 15  MG tablet Take 15 mg by mouth at bedtime. 09/22/21  Yes [provider]  omeprazole (PRILOSEC) 20 MG capsule Take 2 capsules (40 mg total) by mouth daily. Patient taking differently: Take 20 mg by mouth daily. 03/29/16  Yes Rai, Ripudeep K, MD  potassium chloride SA (KLOR-CON M) 20 MEQ tablet Take 20 mEq by mouth 2 (two) times daily. 12/07/22  Yes [provider]  traZODone (DESYREL) 50 MG tablet Take 50 mg by mouth at bedtime. Anxiety and depression   Yes [provider]  dicyclomine (BENTYL) 20 MG tablet Take 1 tablet (20 mg total) by mouth 2 (two) times daily. Patient not taking: Reported on 12/15/2022 05/19/21   Sloan Leiter, DO  folic acid (FOLVITE) 1 MG tablet Take 1 tablet (1 mg total) by mouth daily. Patient not taking: Reported on 12/15/2022 11/21/21   Maretta Bees, MD      Allergies    Patient has no known allergies.    Review of Systems   Review of Systems  Constitutional:  Negative for chills and fever.  Respiratory:  Negative for shortness of breath.   Cardiovascular:  Negative for chest pain.  Gastrointestinal:  Positive for abdominal pain, diarrhea, nausea and vomiting.    Physical Exam Updated Vital Signs BP 101/85   Pulse 88   Temp 98.3 F (36.8 C) (Oral)   Resp (!) 24   Ht 4\' 11"  (1.499 m)   Wt 53.1 kg   LMP 12/25/2010   SpO2 100%   BMI  23.63 kg/m  Physical Exam Vitals and nursing note reviewed.  Constitutional:      General: She is not in acute distress.    Appearance: She is not ill-appearing.  HENT:     Head: Normocephalic.  Eyes:     Pupils: Pupils are equal, round, and reactive to light.  Cardiovascular:     Rate and Rhythm: Normal rate and regular rhythm.     Pulses: Normal pulses.     Heart sounds: Normal heart sounds. No murmur heard.    No friction rub. No gallop.  Pulmonary:     Effort: Pulmonary effort is normal.     Breath sounds: Normal breath sounds.  Abdominal:     General: Abdomen is flat. There is no  distension.     Palpations: Abdomen is soft.     Tenderness: There is abdominal tenderness. There is no guarding or rebound.     Comments: TTP in upper abdomen  Musculoskeletal:        General: Normal range of motion.     Cervical back: Neck supple.  Skin:    General: Skin is warm and dry.  Neurological:     General: No focal deficit present.     Mental Status: She is alert.  Psychiatric:        Mood and Affect: Mood normal.        Behavior: Behavior normal.     ED Results / Procedures / Treatments   Labs (all labs ordered are listed, but only abnormal results are displayed) Labs Reviewed  CBC WITH DIFFERENTIAL/PLATELET - Abnormal; Notable for the following components:      Result Value   WBC 13.5 (*)    RBC 3.65 (*)    MCV 107.4 (*)    MCH 38.1 (*)    RDW 15.9 (*)    nRBC 0.7 (*)    Neutro Abs 9.5 (*)    All other components within normal limits  COMPREHENSIVE METABOLIC PANEL - Abnormal; Notable for the following components:   Glucose, Bld 114 (*)    Creatinine, Ser 1.87 (*)    Total Protein 8.5 (*)    AST 43 (*)    Total Bilirubin 1.4 (*)    GFR, Estimated 30 (*)    All other components within normal limits  URINALYSIS, ROUTINE W REFLEX MICROSCOPIC - Abnormal; Notable for the following components:   APPearance CLOUDY (*)    Hgb urine dipstick SMALL (*)    Bilirubin Urine SMALL (*)    Ketones, ur 15 (*)    Protein, ur 100 (*)    Leukocytes,Ua MODERATE (*)    Bacteria, UA RARE (*)    All other components within normal limits  URINE CULTURE  LIPASE, BLOOD    EKG EKG Interpretation Date/Time:  Tuesday December 15 2022 09:57:42 EDT Ventricular Rate:  103 PR Interval:  128 QRS Duration:  82 QT Interval:  387 QTC Calculation: 507 R Axis:   43  Text Interpretation: Sinus tachycardia Atrial premature complexes Abnormal R-wave progression, early transition Borderline repolarization abnormality Prolonged QT interval Baseline wander in lead(s) I V6 Confirmed by  Fulton Reek [phone]) on 12/15/2022 10:03:50 AM  Radiology CT ABDOMEN PELVIS WO CONTRAST  Result Date: 12/15/2022 CLINICAL DATA:  Abdominal pain and diarrhea. History of tubal ligation, hiatal hernia repair and cholecystectomy. Nausea and vomiting. EXAM: CT ABDOMEN AND PELVIS WITHOUT CONTRAST TECHNIQUE: Multidetector CT imaging of the abdomen and pelvis was performed following the standard protocol without IV contrast. RADIATION DOSE  REDUCTION: This exam was performed according to the departmental dose-optimization program which includes automated exposure control, adjustment of the mA and/or kV according to patient size and/or use of iterative reconstruction technique. COMPARISON:  Ultrasound 09/11/2022.  CT scan 06/18/2022 and older FINDINGS: Lower chest: Mild linear opacity lung bases likely scar or atelectasis. No pleural effusion. Coronary artery calcifications are seen. Please correlate for other coronary risk factors. Small hiatal hernia. There are some adjacent surgical changes as well. Appearance is similar to previous. Hepatobiliary: On this non IV contrast exam, the hepatic parenchyma is grossly preserved. Previous cholecystectomy. Pancreas: Mild pancreatic atrophy. No mass lesion, fluid collection or adjacent inflammatory stranding at this time. No CT sequela of pancreatitis. Spleen: Normal in size without focal abnormality. Adrenals/Urinary Tract: Adrenal glands are unremarkable. Kidneys are normal, without renal calculi, focal lesion, or hydronephrosis. Bladder is underdistended. Stomach/Bowel: On this non oral contrast exam there is scattered fluid-filled loops of small and large bowel. There are some air and fluid in the stomach. No obstruction or dilatation. Normal appendix in the right lower quadrant. There are some areas of fat along the wall of the ascending colon, unchanged from previous. Of note there is a loop of colon extending between the liver in the abdominal wall.  Vascular/Lymphatic: Aortic atherosclerosis. No enlarged abdominal or pelvic lymph nodes. Reproductive: Uterus and bilateral adnexa are unremarkable. Other: Small amount of free fluid dependently in the pelvis with some mesenteric haziness of uncertain etiology. No free intra-abdominal air. Small umbilical hernia containing fat and some fluid. Musculoskeletal: Severe levoconvex curvature of the mid lumbar spine with degenerative changes and rotatory component. IMPRESSION: No bowel obstruction, free air. There is some mesenteric stranding and some simple fluid in the dependent pelvis of uncertain etiology and significance. Please correlate with clinical findings. Follow up may be of some benefit. There is fluid-filled loops of bowel diffusely. Surgical changes from hiatal hernia repair and cholecystectomy. Small hiatal hernia suggested. Coronary artery calcifications. Please correlate for other coronary risk factors. Electronically Signed   By: Karen Kays M.D.   On: 12/15/2022 13:29    Procedures .Critical Care  Performed by: Mannie Stabile, PA-C Authorized by: Mannie Stabile, PA-C   Critical care provider statement:    Critical care time (minutes):  33   Critical care was necessary to treat or prevent imminent or life-threatening deterioration of the following conditions:  Dehydration   Critical care was time spent personally by me on the following activities:  Development of treatment plan with patient or surrogate, discussions with consultants, evaluation of patient's response to treatment, examination of patient, ordering and review of laboratory studies, ordering and review of radiographic studies, ordering and performing treatments and interventions, pulse oximetry, re-evaluation of patient's condition and review of old charts   I assumed direction of critical care for this patient from another provider in my specialty: no     Care discussed with: admitting provider       Medications  Ordered in ED Medications  dicyclomine (BENTYL) injection 20 mg (20 mg Intramuscular Given 12/15/22 1007)  sodium chloride 0.9 % bolus 1,000 mL ( Intravenous Stopped 12/15/22 1255)    ED Course/ Medical Decision Making/ A&P Clinical Course as of 12/15/22 1513  Tue Dec 15, 2022  0955 WBC(!): 13.5 [CA]  1416 Reassessed patient.  Patient denies any urinary symptoms.  Does note that her urine is very dark in color.  Patient will require admission for AKI likely secondary to GI loss. [CA]  Clinical Course User Index [CA] Mannie Stabile, PA-C                                 Medical Decision Making Amount and/or Complexity of Data Reviewed External Data Reviewed: notes.    Details: UC note prior to arrival Labs: ordered. Decision-making details documented in ED Course. Radiology: ordered and independent interpretation performed. Decision-making details documented in ED Course. ECG/medicine tests: ordered and independent interpretation performed. Decision-making details documented in ED Course.  Risk Prescription drug management. Decision regarding hospitalization.   This patient presents to the ED for concern of abdominal pain, this involves an extensive number of treatment options, and is a complaint that carries with it a high risk of complications and morbidity.  The differential diagnosis includes pancreatitis, acute cholecystitis, alcoholic gastritis, gastroenteritis, etc  62 year old female presents to the ED due to upper abdominal pain associated with nausea, vomiting, and diarrhea.  Admits to numerous episodes of nonbloody diarrhea.  Does admit to 1 episode of nonbloody, nonbilious emesis yesterday after taking medications.  Patient seen at urgent care prior to arrival and sent to the ED for further evaluation.  History of alcohol abuse.  Typically drinks 2 smashes daily. Last drank 1 yesterday. No chest pain or shortness of breath.  Upon arrival, patient afebrile, mildly  tachycardic at 102 with otherwise reassuring vitals.  Patient appears comfortable in bed.  Abdomen soft, nondistended with upper abdominal tenderness.  Routine labs ordered.  Lipase to rule out evidence of pancreatitis.  CT abdomen ordered.  Patient given Reglan prior to arrival.  IM Bentyl given.  Will hold off on IV fluids at this time due to IV fluid shortage.  Oral rehydration started.  CBC significant for leukocytosis at 13.5.  Lipase normal.  Low suspicion for pancreatitis.  CMP with AKI with creatinine 1.87.  Normal BUN.  GFR less than 30.  Creatinine 6 months ago 1.08.  10 months ago normal.  No recent labs over the past 6 months; however patient notes she has normal kidney function at baseline.  UA with moderate leukocytes and rare bacteria.  21-50 white blood cells however, appears contaminated.  Does demonstrate some hematuria and bilirubin.  Patient denies any urinary symptoms.  Does note her urine is very dark in color likely secondary to dehydration.  Urine culture pending.  Will hold off on antibiotics at this time.  EKG demonstrates sinus tachycardia.  Prolonged QTc. Low suspicion for atypical ACS.   CT abdomen personally reviewed and interpreted which is negative for bowel obstruction or free air.  Does demonstrate some mesenteric stranding and some simple fluid in pelvis of unknown etiology.  Does demonstrate some fluid-filled loops of bowel diffusely likely due to diarrheal illness.  Given persistent diarrhea and AKI patient require admission.  Will discuss with hospitalist.  Discussed with Dr. Earlene Plater who agrees with assessment and plan.  3:10 PM Discussed with Dr. Erenest Blank with [facility] who agrees to admit patient.        Final Clinical Impression(s) / ED Diagnoses Final diagnoses:  AKI (acute kidney injury) (HCC)  Diarrhea, unspecified type    Rx / DC Orders ED Discharge Orders     None         Jesusita Oka 12/15/22 1513    Laurence Spates, MD 12/15/22  1924

## 2022-12-15 NOTE — ED Notes (Signed)
Pt provided with baked chicken meal and hot tea

## 2022-12-15 NOTE — ED Notes (Signed)
Pt back to room, back on monitor.

## 2022-12-15 NOTE — H&P (Signed)
PCP:   Raymon Mutton., FNP   Chief Complaint:  Diarrhea  HPI: This is a 62 year old female with past medical history of HLD, HTN, depression, alcohol abuse.  Patient presents with complaint of 3 days of diarrhea.  Per patient this started Saturday night and progressively worsening worse.  Today she has gone at least 7-8.  On Sunday her stomach started hurting.  She describes this cramping, intermittent, and occasionally sharp.  This is also persisted.  She had nausea vomiting once but she attributes that to her large KDur tablet getting  stuck in her throat.  She denies sick contacts, lightheadedness, dizziness, fevers or chills.  Patient went to  [facility] ER.  In the ER patient is hemodynamically stable.  Creatinine 1.87, baseline normal.  MCV 107.4.  UA mildly suggestive of UTI with ketones of 15 leukocytes esterase moderate bacteria rare.  CT abdomen pelvis without acute issues.  EKG NSR QTc 502  Review of Systems:  Per HPI  Past Medical History: Past Medical History:  Diagnosis Date   Alcohol abuse    No alcohol intake since 2001   Allergic rhinitis    Anemia    Arthritis    "back, hands" (01/06/2018)   Carpal tunnel syndrome, right    Chronic lower back pain    MRI of cervical, lumbar spine (all obtained by Dr. Sherlean Foot in 2006)- showing mild DDD with bulges but without disc herniation, at multiple levels; mild facet joint dz, primarily at L3-4 and L4-5 bilaterally   GERD (gastroesophageal reflux disease)    Heartburn    History of COVID-19 05/2019   History of hiatal hernia    history of   Hyperlipidemia    Hypertension    Left knee pain    s/p ACL reconstruction by Dr. Sherlean Foot   Shoulder pain, bilateral    MRI right shoulder showing tear of infraspinatus tendon with bursal surface fraying of the infraspinatus   Past Surgical History:  Procedure Laterality Date   ANTERIOR CRUCIATE LIGAMENT REPAIR Left    By Dr. Margie Ege RELEASE Right 02/08/2019   Procedure:  CARPAL TUNNEL RELEASE;  Surgeon: Ranee Gosselin, MD;  Location: [facility];  Service: Orthopedics;  Laterality: Right;   CESAREAN SECTION  1986   CHOLECYSTECTOMY N/A 06/25/2020   Procedure: LAPAROSCOPIC CHOLECYSTECTOMY WITH POSSIBLE INTRAOPERATIVE CHOLANGIOGRAM;  Surgeon: Gaynelle Adu, MD;  Location: [facility];  Service: General;  Laterality: N/A;   ESOPHAGOGASTRODUODENOSCOPY N/A 07/11/2019   Procedure: UPPER ESOPHAGOGASTRODUODENOSCOPY (EGD);  Surgeon: Gaynelle Adu, MD;  Location: [facility];  Service: General;  Laterality: N/A;   HIATAL HERNIA REPAIR N/A 07/11/2019   Procedure: LAPAROSCOPIC REPAIR OF PARAESOPHAGEAL HERNIA WITH TOUPET FUNDOPLICATION AND GASTROPEXY;  Surgeon: Gaynelle Adu, MD;  Location: [facility];  Service: General;  Laterality: N/A;   REVERSE SHOULDER ARTHROPLASTY Left 09/19/2020   Procedure: REVERSE SHOULDER ARTHROPLASTY;  Surgeon: Jones Broom, MD;  Location: [facility];  Service: Orthopedics;  Laterality: Left;   SHOULDER ARTHROSCOPY W/ ROTATOR CUFF REPAIR Right    TUBAL LIGATION  1989    Medications: Prior to Admission medications   Medication Sig Start Date End Date Taking? Authorizing Provider  atorvastatin (LIPITOR) 40 MG tablet Take 40 mg by mouth at bedtime.   Yes [provider]  cetirizine (ZYRTEC) 10 MG tablet Take 10 mg by mouth every morning. 12/07/22  Yes [provider]  cyanocobalamin (VITAMIN B12) 1000 MCG tablet Take 1 tablet (1,000 mcg total) by mouth daily. 11/21/21  Yes Ghimire, Werner Lean, MD  hydrALAZINE (APRESOLINE) 50 MG tablet Take 50 mg by mouth every morning. Blood pressure 12/07/22  Yes [provider]  loperamide (IMODIUM) 2 MG capsule Take 1 capsule (2 mg total) by mouth 2 (two) times daily as needed for diarrhea or loose stools. 01/19/21  Yes Wallis Bamberg, PA-C  losartan (COZAAR) 100 MG tablet Take 100 mg by mouth every morning. 12/07/22  Yes [provider]  mirtazapine (REMERON) 15 MG tablet Take 15 mg by  mouth at bedtime. 09/22/21  Yes [provider]  omeprazole (PRILOSEC) 20 MG capsule Take 2 capsules (40 mg total) by mouth daily. Patient taking differently: Take 20 mg by mouth daily. 03/29/16  Yes Rai, Ripudeep K, MD  potassium chloride SA (KLOR-CON M) 20 MEQ tablet Take 20 mEq by mouth 2 (two) times daily. 12/07/22  Yes [provider]  traZODone (DESYREL) 50 MG tablet Take 50 mg by mouth at bedtime. Anxiety and depression   Yes [provider]  dicyclomine (BENTYL) 20 MG tablet Take 1 tablet (20 mg total) by mouth 2 (two) times daily. Patient not taking: Reported on 12/15/2022 05/19/21   Sloan Leiter, DO  folic acid (FOLVITE) 1 MG tablet Take 1 tablet (1 mg total) by mouth daily. Patient not taking: Reported on 12/15/2022 11/21/21   Maretta Bees, MD    Allergies:  No Known Allergies  Social History:  reports that she has never smoked. She has never used smokeless tobacco. She reports that she does not currently use alcohol after a past usage of about 1.0 standard drink of alcohol per week. She reports that she does not currently use drugs.  Family History: Family History  Problem Relation Age of Onset   Stroke Mother    Colon cancer Neg Hx    Esophageal cancer Neg Hx    Rectal cancer Neg Hx    Stomach cancer Neg Hx     Physical Exam: Vitals:   12/15/22 1430 12/15/22 1500 12/15/22 1804 12/15/22 1858  BP: 101/85 (!) 151/128 (!) 147/96 (!) 150/97  Pulse: 88 92 97 91  Resp: (!) 24 (!) 25 (!) 27 18  Temp:  98.4 F (36.9 C) 99.5 F (37.5 C) 98.5 F (36.9 C)  TempSrc:  Oral Oral Oral  SpO2: 100% 100% 99% 99%  Weight:      Height:        General: A and O x 3, petite female, in no distress Eyes: Pink conjunctiva, no scleral icterus ENT: Moist oral mucosa, neck supple, no thyromegaly Lungs: CTA B/L, no wheeze, no crackles, no use of accessory muscles Cardiovascular: RRR, no regurgitation, no gallops, no murmurs. No carotid bruits, no  JVD Abdomen: soft, positive BS, benign abdomen, no organomegaly, not an acute abdomen GU: not examined Neuro: CN II - XII grossly intact, sensation intact Musculoskeletal: strength 5/5 all extremities, no edema Skin: no rash, no subcutaneous crepitation, no decubitus Psych: appropriate patient   Labs on Admission:  Recent Labs    12/15/22 0932  NA 141  K 3.5  CL 106  CO2 22  GLUCOSE 114*  BUN 11  CREATININE 1.87*  CALCIUM 9.5   Recent Labs    12/15/22 0932  AST 43*  ALT 23  ALKPHOS 87  BILITOT 1.4*  PROT 8.5*  ALBUMIN 4.9   Recent Labs    12/15/22 0932  LIPASE 36   Recent Labs    12/15/22 0932  WBC 13.5*  NEUTROABS 9.5*  HGB  13.9  HCT 39.2  MCV 107.4*  PLT 278    Radiological Exams on Admission: CT ABDOMEN PELVIS WO CONTRAST  Result Date: 12/15/2022 CLINICAL DATA:  Abdominal pain and diarrhea. History of tubal ligation, hiatal hernia repair and cholecystectomy. Nausea and vomiting. EXAM: CT ABDOMEN AND PELVIS WITHOUT CONTRAST TECHNIQUE: Multidetector CT imaging of the abdomen and pelvis was performed following the standard protocol without IV contrast. RADIATION DOSE REDUCTION: This exam was performed according to the departmental dose-optimization program which includes automated exposure control, adjustment of the mA and/or kV according to patient size and/or use of iterative reconstruction technique. COMPARISON:  Ultrasound 09/11/2022.  CT scan 06/18/2022 and older FINDINGS: Lower chest: Mild linear opacity lung bases likely scar or atelectasis. No pleural effusion. Coronary artery calcifications are seen. Please correlate for other coronary risk factors. Small hiatal hernia. There are some adjacent surgical changes as well. Appearance is similar to previous. Hepatobiliary: On this non IV contrast exam, the hepatic parenchyma is grossly preserved. Previous cholecystectomy. Pancreas: Mild pancreatic atrophy. No mass lesion, fluid collection or adjacent  inflammatory stranding at this time. No CT sequela of pancreatitis. Spleen: Normal in size without focal abnormality. Adrenals/Urinary Tract: Adrenal glands are unremarkable. Kidneys are normal, without renal calculi, focal lesion, or hydronephrosis. Bladder is underdistended. Stomach/Bowel: On this non oral contrast exam there is scattered fluid-filled loops of small and large bowel. There are some air and fluid in the stomach. No obstruction or dilatation. Normal appendix in the right lower quadrant. There are some areas of fat along the wall of the ascending colon, unchanged from previous. Of note there is a loop of colon extending between the liver in the abdominal wall. Vascular/Lymphatic: Aortic atherosclerosis. No enlarged abdominal or pelvic lymph nodes. Reproductive: Uterus and bilateral adnexa are unremarkable. Other: Small amount of free fluid dependently in the pelvis with some mesenteric haziness of uncertain etiology. No free intra-abdominal air. Small umbilical hernia containing fat and some fluid. Musculoskeletal: Severe levoconvex curvature of the mid lumbar spine with degenerative changes and rotatory component. IMPRESSION: No bowel obstruction, free air. There is some mesenteric stranding and some simple fluid in the dependent pelvis of uncertain etiology and significance. Please correlate with clinical findings. Follow up may be of some benefit. There is fluid-filled loops of bowel diffusely. Surgical changes from hiatal hernia repair and cholecystectomy. Small hiatal hernia suggested. Coronary artery calcifications. Please correlate for other coronary risk factors. Electronically Signed   By: Karen Kays M.D.   On: 12/15/2022 13:29    Assessment/Plan Present on Admission:  AKI (acute kidney injury) (HCC) -Secondary to diarrhea, IV fluid hydration D5 LR @ 75cc/hr -Hold losartan and potassium.  Avoid nephrotoxic agents -BMP in a.m.   Diarrhea, unclear etiology -GI panel ordered.  If  diarrhea persists, C. difficile toxin if indicated -Single dose Imodium if patient still with diarrhea -Compazine as needed   Prolonged QT interval -Magnesium level added -Current potassium 3.5.  Goal 4.  Goal magnesium level t   Alcohol abuse? -Patient hedged when asked patient about alcohol use.  Initially saying, and then saying she had energy drinks, then the drinks had a little bit of alcohol, then the drinks had 8% alcohol content.  She considered to drinking probably 2 daily. -CIWA protocol initiated -Continue multivitamin, folate and vitamin B1 supplementation   ?UTI -No symptoms of UTI.  Await cultures.  No antibiotics started, may be contaminated.   HTN -Lopressor 5mg  PRN SBP>180, DBP >100 -Losartan and potassium held with AKI -Home  medication hydralazine resumed   Hyperlipidemia -Lipitor held  Allah Reason 12/15/2022, 8:00 PM

## 2022-12-16 DIAGNOSIS — I10 Essential (primary) hypertension: Secondary | ICD-10-CM

## 2022-12-16 DIAGNOSIS — R197 Diarrhea, unspecified: Secondary | ICD-10-CM | POA: Diagnosis not present

## 2022-12-16 DIAGNOSIS — E876 Hypokalemia: Secondary | ICD-10-CM

## 2022-12-16 DIAGNOSIS — N179 Acute kidney failure, unspecified: Secondary | ICD-10-CM | POA: Diagnosis not present

## 2022-12-16 LAB — CBC WITH DIFFERENTIAL/PLATELET
Abs Immature Granulocytes: 0.02 10*3/uL (ref 0.00–0.07)
Basophils Absolute: 0 10*3/uL (ref 0.0–0.1)
Basophils Relative: 0 %
Eosinophils Absolute: 0.2 10*3/uL (ref 0.0–0.5)
Eosinophils Relative: 2 %
HCT: 29.8 % — ABNORMAL LOW (ref 36.0–46.0)
Hemoglobin: 10 g/dL — ABNORMAL LOW (ref 12.0–15.0)
Immature Granulocytes: 0 %
Lymphocytes Relative: 33 %
Lymphs Abs: 2.4 10*3/uL (ref 0.7–4.0)
MCH: 38.8 pg — ABNORMAL HIGH (ref 26.0–34.0)
MCHC: 33.6 g/dL (ref 30.0–36.0)
MCV: 115.5 fL — ABNORMAL HIGH (ref 80.0–100.0)
Monocytes Absolute: 0.6 10*3/uL (ref 0.1–1.0)
Monocytes Relative: 8 %
Neutro Abs: 4 10*3/uL (ref 1.7–7.7)
Neutrophils Relative %: 57 %
Platelets: 186 10*3/uL (ref 150–400)
RBC: 2.58 MIL/uL — ABNORMAL LOW (ref 3.87–5.11)
RDW: 15.9 % — ABNORMAL HIGH (ref 11.5–15.5)
WBC: 7.2 10*3/uL (ref 4.0–10.5)
nRBC: 0 % (ref 0.0–0.2)

## 2022-12-16 LAB — BASIC METABOLIC PANEL
Anion gap: 8 (ref 5–15)
BUN: 16 mg/dL (ref 8–23)
CO2: 19 mmol/L — ABNORMAL LOW (ref 22–32)
Calcium: 7.9 mg/dL — ABNORMAL LOW (ref 8.9–10.3)
Chloride: 113 mmol/L — ABNORMAL HIGH (ref 98–111)
Creatinine, Ser: 1.25 mg/dL — ABNORMAL HIGH (ref 0.44–1.00)
GFR, Estimated: 49 mL/min — ABNORMAL LOW (ref >60)
Glucose, Bld: 96 mg/dL (ref 70–99)
Potassium: 2.8 mmol/L — ABNORMAL LOW (ref 3.5–5.1)
Sodium: 140 mmol/L (ref 135–145)

## 2022-12-16 LAB — MAGNESIUM: Magnesium: 1.1 mg/dL — ABNORMAL LOW (ref 1.7–2.4)

## 2022-12-16 MED ORDER — POTASSIUM CHLORIDE CRYS ER 20 MEQ PO TBCR
40.0000 meq | EXTENDED_RELEASE_TABLET | ORAL | Status: AC
  Administered 2022-12-16 (×2): 40 meq via ORAL
  Filled 2022-12-16 (×2): qty 2

## 2022-12-16 MED ORDER — OXYCODONE HCL 5 MG PO TABS: 5.0000 mg | ORAL_TABLET | Freq: Once | ORAL | Status: DC

## 2022-12-16 MED ORDER — PANTOPRAZOLE SODIUM 40 MG PO TBEC
40.0000 mg | DELAYED_RELEASE_TABLET | Freq: Two times a day (BID) | ORAL | Status: DC
  Administered 2022-12-16 – 2022-12-17 (×2): 40 mg via ORAL
  Filled 2022-12-16 (×2): qty 1

## 2022-12-16 MED ORDER — MAGNESIUM SULFATE 4 GM/100ML IV SOLN
4.0000 g | Freq: Once | INTRAVENOUS | Status: AC
  Administered 2022-12-16: 4 g via INTRAVENOUS
  Filled 2022-12-16: qty 100

## 2022-12-16 MED ORDER — CALCIUM CARBONATE ANTACID 500 MG PO CHEW
400.0000 mg | CHEWABLE_TABLET | Freq: Once | ORAL | Status: AC
  Administered 2022-12-16: 400 mg via ORAL
  Filled 2022-12-16: qty 2

## 2022-12-16 MED ORDER — OXYCODONE HCL 5 MG PO TABS
10.0000 mg | ORAL_TABLET | Freq: Four times a day (QID) | ORAL | Status: DC | PRN
  Administered 2022-12-16 – 2022-12-17 (×5): 10 mg via ORAL
  Filled 2022-12-16 (×5): qty 2

## 2022-12-16 MED ORDER — POTASSIUM CHLORIDE CRYS ER 20 MEQ PO TBCR
40.0000 meq | EXTENDED_RELEASE_TABLET | Freq: Once | ORAL | Status: AC
  Administered 2022-12-16: 40 meq via ORAL
  Filled 2022-12-16: qty 2

## 2022-12-16 NOTE — Assessment & Plan Note (Deleted)
QTC 507 msec on EKG 12-15-2022 Likely due to low Mg level. Repeat EKG in AM after IV mag replacement.

## 2022-12-16 NOTE — Assessment & Plan Note (Signed)
Stable

## 2022-12-16 NOTE — Assessment & Plan Note (Signed)
Stable. On CIWA protocol

## 2022-12-16 NOTE — Assessment & Plan Note (Signed)
12-16-2022 norovirus diarrhea PCR sent and pending. Slowing down.

## 2022-12-16 NOTE — Assessment & Plan Note (Signed)
Admitted for AKI. Admission Scr 1.87. given IVF 12-16-2022 improved Scr of 1.25. IVF stopped.

## 2022-12-16 NOTE — Assessment & Plan Note (Signed)
Replete with 4 grams IV Mg. Repeat Mg level in AM.

## 2022-12-16 NOTE — Subjective & Objective (Signed)
Pt seen and examined. Has a large salad with chicken and a [place] sandwich on her bedtray. No vomiting. C/o pain where she had IM injection of bentyl. Still with diarrhea but improved.

## 2022-12-16 NOTE — Plan of Care (Signed)

## 2022-12-16 NOTE — Assessment & Plan Note (Signed)
Stable. Holding ARB due to AKI.

## 2022-12-16 NOTE — Progress Notes (Signed)
Mobility Specialist - Progress Note   12/16/22 1037  Mobility  Activity Ambulated with assistance in hallway  Level of Assistance Standby assist, set-up cues, supervision of patient - no hands on  Assistive Device Four wheel walker  Distance Ambulated (ft) 600 ft  Activity Response Tolerated well  Mobility Referral Yes  $Mobility charge 1 Mobility  Mobility Specialist Start Time (ACUTE ONLY) 1013  Mobility Specialist Stop Time (ACUTE ONLY) 1036  Mobility Specialist Time Calculation (min) (ACUTE ONLY) 23 min   Pt received in bed and agreeable to mobility. No complaints during session. Pt to bed after session with all needs met.    [facility]

## 2022-12-16 NOTE — Progress Notes (Signed)
PROGRESS NOTE    Shaunice Fosnight  NWG:[id number] DOB: Oct 15, 1960 DOA: 12/15/2022 PCP: Raymon Mutton., FNP  Subjective: Pt seen and examined. Has a large salad with chicken and a [place] sandwich on her bedtray. No vomiting. C/o pain where she had IM injection of bentyl. Still with diarrhea but improved.   Hospital Course: HPI: This is a 62 year old female with past medical history of HLD, HTN, depression, alcohol abuse.  Patient presents with complaint of 3 days of diarrhea.  Per patient this started Saturday night and progressively worsening worse.  Today she has gone at least 7-8.  On Sunday her stomach started hurting.  She describes this cramping, intermittent, and occasionally sharp.  This is also persisted.  She had nausea vomiting once but she attributes that to her large KDur tablet getting  stuck in her throat.  She denies sick contacts, lightheadedness, dizziness, fevers or chills.  Patient went to  [facility] ER.   In the ER patient is hemodynamically stable.  Creatinine 1.87, baseline normal.  MCV 107.4.  UA mildly suggestive of UTI with ketones of 15 leukocytes esterase moderate bacteria rare.  CT abdomen pelvis without acute issues.  EKG NSR QTc 502  Significant Events: Admitted 12/15/2022 for diarrhea, hypokalemia, hypomagnesemia   Significant Labs: Admitting K 3.5 12-16-2022 K 2.8, Mag 1.2  Significant Imaging Studies: Admitting CT abd showed No bowel obstruction, free air. There is some mesenteric stranding and some simple fluid in the dependent pelvis of uncertain etiology and significance. Please correlate with clinical findings. Follow up may be of some benefit. There is fluid-filled loops of bowel diffusely.  Surgical changes from hiatal hernia repair and cholecystectomy. Small hiatal hernia suggested.  Coronary artery calcifications  Antibiotic Therapy: Anti-infectives (From admission, onward)    None       Procedures:   Consultants:      Assessment and Plan: * AKI (acute kidney injury) (HCC) Admitted for AKI. Admission Scr 1.87. given IVF 12-16-2022 improved Scr of 1.25. IVF stopped.  Diarrhea 12-16-2022 norovirus diarrhea PCR sent and pending. Slowing down.  Hypomagnesemia Replete with 4 grams IV Mg. Repeat Mg level in AM.  Hypokalemia Replete with po kcl. Repeat in AM.  Alcohol use Stable. On CIWA protocol  Prolonged QT interval QTC 507 msec on EKG 12-15-2022 Likely due to low Mg level. Repeat EKG in AM after IV mag replacement.    HTN (hypertension) Stable. Holding ARB due to AKI.  Hyperlipidemia Stable.   DVT prophylaxis: heparin injection 5,000 Units Start: 12/15/22 2200    Code Status: Full Code Family Communication: no family at bedside. Pt is decisional Disposition Plan: return home Reason for continuing need for hospitalization: continue with K replacement. Repeat EKG and CMP, Mg in AM.  Objective: Vitals:   12/15/22 2311 12/16/22 0617 12/16/22 1022 12/16/22 1304  BP: (!) 141/88 134/77 120/74 119/76  Pulse: 87 72 88 87  Resp: 18 18 16 13   Temp: 98.4 F (36.9 C) 98.3 F (36.8 C) 98.2 F (36.8 C) 97.7 F (36.5 C)  TempSrc: Oral Oral Oral Axillary  SpO2: 100% 100% 100% 100%  Weight:      Height:        Intake/Output Summary (Last 24 hours) at 12/16/2022 1402 Last data filed at 12/16/2022 1400 Gross per 24 hour  Intake 1327.56 ml  Output 400 ml  Net 927.56 ml   Filed Weights   12/15/22 0922  Weight: 53.1 kg    Examination:  Physical Exam Vitals and nursing  note reviewed.  Constitutional:      General: She is not in acute distress.    Appearance: She is not toxic-appearing or diaphoretic.  HENT:     Head: Normocephalic and atraumatic.     Nose: Nose normal.  Eyes:     General: No scleral icterus. Cardiovascular:     Rate and Rhythm: Normal rate and regular rhythm.  Pulmonary:     Effort: Pulmonary effort is normal.     Breath sounds: Normal breath sounds.   Abdominal:     General: Bowel sounds are normal. There is no distension.     Palpations: Abdomen is soft.     Tenderness: There is no abdominal tenderness.  Musculoskeletal:     Right lower leg: No edema.     Left lower leg: No edema.  Skin:    General: Skin is warm and dry.     Capillary Refill: Capillary refill takes less than 2 seconds.  Neurological:     General: No focal deficit present.     Mental Status: She is alert and oriented to person, place, and time.     Data Reviewed: I have personally reviewed following labs and imaging studies  CBC: Recent Labs  Lab 12/15/22 0932 12/16/22 0504  WBC 13.5* 7.2  NEUTROABS 9.5* 4.0  HGB 13.9 10.0*  HCT 39.2 29.8*  MCV 107.4* 115.5*  PLT 278 186   Basic Metabolic Panel: Recent Labs  Lab 12/15/22 0932 12/15/22 2120 12/16/22 0504  NA 141 141 140  K 3.5 3.2* 2.8*  CL 106 115* 113*  CO2 22 17* 19*  GLUCOSE 114* 98 96  BUN 11 15 16   CREATININE 1.87* 1.52* 1.25*  CALCIUM 9.5 8.1* 7.9*  MG  --  1.2* 1.1*  PHOS  --  3.6  --    GFR: Estimated Creatinine Clearance: 34.8 mL/min (A) (by C-G formula based on SCr of 1.25 mg/dL (H)). Liver Function Tests: Recent Labs  Lab 12/15/22 0932  AST 43*  ALT 23  ALKPHOS 87  BILITOT 1.4*  PROT 8.5*  ALBUMIN 4.9   Recent Labs  Lab 12/15/22 0932  LIPASE 36    Radiology Studies: CT ABDOMEN PELVIS WO CONTRAST  Result Date: 12/15/2022 CLINICAL DATA:  Abdominal pain and diarrhea. History of tubal ligation, hiatal hernia repair and cholecystectomy. Nausea and vomiting. EXAM: CT ABDOMEN AND PELVIS WITHOUT CONTRAST TECHNIQUE: Multidetector CT imaging of the abdomen and pelvis was performed following the standard protocol without IV contrast. RADIATION DOSE REDUCTION: This exam was performed according to the departmental dose-optimization program which includes automated exposure control, adjustment of the mA and/or kV according to patient size and/or use of iterative reconstruction  technique. COMPARISON:  Ultrasound 09/11/2022.  CT scan 06/18/2022 and older FINDINGS: Lower chest: Mild linear opacity lung bases likely scar or atelectasis. No pleural effusion. Coronary artery calcifications are seen. Please correlate for other coronary risk factors. Small hiatal hernia. There are some adjacent surgical changes as well. Appearance is similar to previous. Hepatobiliary: On this non IV contrast exam, the hepatic parenchyma is grossly preserved. Previous cholecystectomy. Pancreas: Mild pancreatic atrophy. No mass lesion, fluid collection or adjacent inflammatory stranding at this time. No CT sequela of pancreatitis. Spleen: Normal in size without focal abnormality. Adrenals/Urinary Tract: Adrenal glands are unremarkable. Kidneys are normal, without renal calculi, focal lesion, or hydronephrosis. Bladder is underdistended. Stomach/Bowel: On this non oral contrast exam there is scattered fluid-filled loops of small and large bowel. There are some air and fluid in  the stomach. No obstruction or dilatation. Normal appendix in the right lower quadrant. There are some areas of fat along the wall of the ascending colon, unchanged from previous. Of note there is a loop of colon extending between the liver in the abdominal wall. Vascular/Lymphatic: Aortic atherosclerosis. No enlarged abdominal or pelvic lymph nodes. Reproductive: Uterus and bilateral adnexa are unremarkable. Other: Small amount of free fluid dependently in the pelvis with some mesenteric haziness of uncertain etiology. No free intra-abdominal air. Small umbilical hernia containing fat and some fluid. Musculoskeletal: Severe levoconvex curvature of the mid lumbar spine with degenerative changes and rotatory component. IMPRESSION: No bowel obstruction, free air. There is some mesenteric stranding and some simple fluid in the dependent pelvis of uncertain etiology and significance. Please correlate with clinical findings. Follow up may be of  some benefit. There is fluid-filled loops of bowel diffusely. Surgical changes from hiatal hernia repair and cholecystectomy. Small hiatal hernia suggested. Coronary artery calcifications. Please correlate for other coronary risk factors. Electronically Signed   By: Karen Kays M.D.   On: 12/15/2022 13:29    Scheduled Meds:  folic acid  1 mg Oral Daily   heparin  5,000 Units Subcutaneous Q8H   hydrALAZINE  50 mg Oral q morning   multivitamin with minerals  1 tablet Oral Daily   oxyCODONE  5 mg Oral Once   potassium chloride  40 mEq Oral Once   thiamine  100 mg Oral Daily   Or   thiamine  100 mg Intravenous Daily   traZODone  50 mg Oral QHS   Continuous Infusions:   LOS: 0 days   Time spent: 40 minutes  Carollee Herter, DO  [facility]  12/16/2022, 2:02 PM

## 2022-12-16 NOTE — Hospital Course (Signed)
Ms. Burgueno is a 62 year old female with PMH HLD, HTN, depression, alcohol abuse.  Patient presents with complaint of 3 days of diarrhea.  Per patient this started Saturday night and progressively worsening worse. On Sunday her stomach started hurting.  She describes this cramping, intermittent, and occasionally sharp.  This is also persisted.  She had nausea vomiting once but she attributes that to her large KDur tablet getting stuck in her throat.  She denies sick contacts, lightheadedness, dizziness, fevers or chills. On workup she was found to have hypokalemia from GI losses and AKI with creatinine 1.87 on admission.  Magnesium levels were also low due to similar GI losses. She was started on fluids and electrolyte replacements.  She felt symptomatically improved the day following admission. She was able to tolerate diet prior to discharge and was considered stable for discharging home. Diarrhea had also improved prior to discharge.

## 2022-12-16 NOTE — TOC Initial Note (Signed)
Transition of Care [facility]) - Initial/Assessment Note   Patient Details  Name: Keviona Kennebeck MRN: [id number] Date of Birth: [birth year]  Transition of Care [facility]) CM/SW Contact:    Ewing Schlein, LCSW Phone Number: 12/16/2022, 2:35 PM  Clinical Narrative: [facility] consulted for ETOH use. CSW met with patient to discuss consult. Patient agreeable to having resources provided. ETOH use resources added to AVS.  Expected Discharge Plan: Home/Self Care Barriers to Discharge: Continued Medical Work up  Patient Goals and CMS Choice Patient states their goals for this hospitalization and ongoing recovery are:: Return home Choice offered to / list presented to : NA  Expected Discharge Plan and Services In-house Referral: Clinical Social Work Post Acute Care Choice: NA Living arrangements for the past 2 months: Single Family Home          DME Arranged: N/A DME Agency: NA  Prior Living Arrangements/Services Living arrangements for the past 2 months: Single Family Home Lives with:: Spouse Patient language and need for interpreter reviewed:: Yes Do you feel safe going back to the place where you live?: Yes      Need for Family Participation in Patient Care: No (Comment) Care giver support system in place?: Yes (comment) Criminal Activity/Legal Involvement Pertinent to Current Situation/Hospitalization: No - Comment as needed  Activities of Daily Living ADL Screening (condition at time of admission) Independently performs ADLs?: Yes (appropriate for developmental age) Is the patient deaf or have difficulty hearing?: No Does the patient have difficulty seeing, even when wearing glasses/contacts?: No Does the patient have difficulty concentrating, remembering, or making decisions?: No  Emotional Assessment Appearance:: Appears stated age Attitude/Demeanor/Rapport: Engaged Affect (typically observed): Accepting Orientation: : Oriented to Self, Oriented to Place, Oriented to  Time,  Oriented to Situation Alcohol / Substance Use: Alcohol Use Psych Involvement: No (comment)  Admission diagnosis:  AKI (acute kidney injury) (HCC) [N17.9] Diarrhea, unspecified type [R19.7] Patient Active Problem List   Diagnosis Date Noted   AKI (acute kidney injury) (HCC) 12/15/2022   Diarrhea 12/15/2022   Alcohol use 12/15/2022   Nonspecific abnormal electrocardiogram (ECG) (EKG)    Prolonged QT interval    Hypomagnesemia    S/P repair of paraesophageal hernia 07/11/2019   Chronic prescription opiate use 03/25/2016   HTN (hypertension) 07/29/2015   Rhinitis, allergic 07/29/2015   GERD (gastroesophageal reflux disease) 11/17/2012   Chronic back pain 11/17/2012   Dental caries 11/17/2012   Sciatica 11/17/2012   Hypokalemia 08/16/2012   IT band syndrome 11/16/2011   Right leg pain 08/31/2011   Mononeuropathy of left upper extremity 07/15/2010   PAIN, CHRONIC NEC 04/27/2006   Hyperlipidemia 03/02/2006   Alcohol abuse 11/20/2005   CARPAL TUNNEL SYNDROME, RIGHT 11/20/2005   ALLERGIC RHINITIS 11/20/2005   PCP:  Raymon Mutton., FNP Pharmacy:   [facility] [phone] Ginette Otto, [place] - [address] AT [facility] [address] [place] [address] Phone: [phone] Fax: [phone]  [facility] [address] [place] [address] Phone: [phone] Fax: [phone]  Social Determinants of Health (SDOH) Social History: SDOH Screenings   Food Insecurity: No Food Insecurity (11/20/2021)  Transportation Needs: Unknown (12/15/2022)  Tobacco Use: Low Risk  (12/15/2022)   SDOH Interventions:    Readmission Risk Interventions     No data to display

## 2022-12-16 NOTE — Assessment & Plan Note (Signed)
Replete with po kcl. Repeat in AM.

## 2022-12-17 DIAGNOSIS — I10 Essential (primary) hypertension: Secondary | ICD-10-CM | POA: Diagnosis not present

## 2022-12-17 DIAGNOSIS — Z96612 Presence of left artificial shoulder joint: Secondary | ICD-10-CM | POA: Diagnosis not present

## 2022-12-17 DIAGNOSIS — R8271 Bacteriuria: Secondary | ICD-10-CM

## 2022-12-17 DIAGNOSIS — N179 Acute kidney failure, unspecified: Secondary | ICD-10-CM | POA: Diagnosis not present

## 2022-12-17 DIAGNOSIS — Z8616 Personal history of COVID-19: Secondary | ICD-10-CM | POA: Diagnosis not present

## 2022-12-17 DIAGNOSIS — E876 Hypokalemia: Secondary | ICD-10-CM | POA: Diagnosis not present

## 2022-12-17 DIAGNOSIS — R197 Diarrhea, unspecified: Secondary | ICD-10-CM | POA: Diagnosis not present

## 2022-12-17 LAB — COMPREHENSIVE METABOLIC PANEL
ALT: 21 U/L (ref 0–44)
AST: 35 U/L (ref 15–41)
Albumin: 3.3 g/dL — ABNORMAL LOW (ref 3.5–5.0)
Alkaline Phosphatase: 67 U/L (ref 38–126)
Anion gap: 5 (ref 5–15)
BUN: 15 mg/dL (ref 8–23)
CO2: 20 mmol/L — ABNORMAL LOW (ref 22–32)
Calcium: 8.3 mg/dL — ABNORMAL LOW (ref 8.9–10.3)
Chloride: 112 mmol/L — ABNORMAL HIGH (ref 98–111)
Creatinine, Ser: 0.89 mg/dL (ref 0.44–1.00)
GFR, Estimated: >60 mL/min (ref >60)
Glucose, Bld: 87 mg/dL (ref 70–99)
Potassium: 4.8 mmol/L (ref 3.5–5.1)
Sodium: 137 mmol/L (ref 135–145)
Total Bilirubin: 0.6 mg/dL (ref 0.3–1.2)
Total Protein: 5.9 g/dL — ABNORMAL LOW (ref 6.5–8.1)

## 2022-12-17 LAB — MAGNESIUM: Magnesium: 2.4 mg/dL (ref 1.7–2.4)

## 2022-12-17 NOTE — Progress Notes (Signed)
AVS reviewed w/ pt who verbalized an understanding. PIV removed as documented. No other questions at this time. Daughter enroute to get pt

## 2022-12-17 NOTE — Discharge Summary (Signed)
Physician Discharge Summary   Toni Gutierrez YQM:[id number] DOB: 03-Mar-1960 DOA: 12/15/2022  PCP: Toni Gutierrez., FNP  Admit date: 12/15/2022 Discharge date: 12/17/2022  Admitted From: Home Disposition:  Home Discharging physician: Toni Chamber, MD Barriers to discharge: none  Recommendations at discharge: Continue chronic medical management   Discharge Condition: stable CODE STATUS: Full Diet recommendation:  Diet Orders (From admission, onward)     Start     Ordered   12/17/22 1154  Diet regular Room service appropriate? Yes; Fluid consistency: Thin  Diet effective now       Comments: Lactose free  Question Answer Comment  Room service appropriate? Yes   Fluid consistency: Thin      12/17/22 1153   12/17/22 0000  Diet general        12/17/22 1155            Hospital Course: [name] is a 62 year old female with PMH HLD, HTN, depression, alcohol abuse.  Patient presents with complaint of 3 days of diarrhea.  Per patient this started Saturday night and progressively worsening worse. On Sunday her stomach started hurting.  She describes this cramping, intermittent, and occasionally sharp.  This is also persisted.  She had nausea vomiting once but she attributes that to her large KDur tablet getting stuck in her throat.  She denies sick contacts, lightheadedness, dizziness, fevers or chills. On workup she was found to have hypokalemia from GI losses and AKI with creatinine 1.87 on admission.  Magnesium levels were also low due to similar GI losses. She was started on fluids and electrolyte replacements.  She felt symptomatically improved the day following admission. She was able to tolerate diet prior to discharge and was considered stable for discharging home. Diarrhea had also improved prior to discharge.   The patient's acute and chronic medical conditions were treated accordingly. On day of discharge, patient was felt deemed stable for discharge.  Patient/family member advised to call PCP or come back to ER if needed.   Principal Diagnosis: AKI (acute kidney injury) [facility])  Discharge Diagnoses: Active Hospital Problems   Diagnosis Date Noted   Alcohol use 12/15/2022    Priority: Low   Prolonged QT interval     Priority: Low   HTN (hypertension) 07/29/2015    Priority: Low   Hyperlipidemia 03/02/2006    Priority: Low    Resolved Hospital Problems   Diagnosis Date Noted Date Resolved   AKI (acute kidney injury) (HCC) 12/15/2022 12/17/2022    Priority: High   Diarrhea 12/15/2022 12/17/2022    Priority: High   Asymptomatic bacteriuria 12/17/2022 12/17/2022    Priority: Medium    Hypomagnesemia  12/17/2022    Priority: Medium    Hypokalemia 08/16/2012 12/17/2022    Priority: Medium      Discharge Instructions     Diet general   Complete by: As directed    Increase activity slowly   Complete by: As directed       Allergies as of 12/17/2022   No Known Allergies      Medication List     STOP taking these medications    dicyclomine 20 MG tablet Commonly known as: BENTYL   folic acid 1 MG tablet Commonly known as: FOLVITE   potassium chloride SA 20 MEQ tablet Commonly known as: KLOR-CON M       TAKE these medications    atorvastatin 40 MG tablet Commonly known as: LIPITOR Take 40 mg by mouth at bedtime.   cetirizine  10 MG tablet Commonly known as: ZYRTEC Take 10 mg by mouth every morning.   cyanocobalamin 1000 MCG tablet Commonly known as: VITAMIN B12 Take 1 tablet (1,000 mcg total) by mouth daily.   hydrALAZINE 50 MG tablet Commonly known as: APRESOLINE Take 50 mg by mouth every morning. Blood pressure   loperamide 2 MG capsule Commonly known as: IMODIUM Take 1 capsule (2 mg total) by mouth 2 (two) times daily as needed for diarrhea or loose stools.   losartan 100 MG tablet Commonly known as: COZAAR Take 100 mg by mouth every morning.   mirtazapine 15 MG tablet Commonly known as:  REMERON Take 15 mg by mouth at bedtime.   omeprazole 20 MG capsule Commonly known as: PRILOSEC Take 2 capsules (40 mg total) by mouth daily. What changed: how much to take   traZODone 50 MG tablet Commonly known as: DESYREL Take 50 mg by mouth at bedtime. Anxiety and depression        No Known Allergies  Consultations:   Procedures:   Discharge Exam: BP 133/83 (BP Location: Right Arm)   Pulse 75   Temp 98.3 F (36.8 C) (Oral)   Resp 18   Ht 4\' 11"  (1.499 m)   Wt 53.1 kg   LMP 12/25/2010   SpO2 100%   BMI 23.63 kg/m  Physical Exam Constitutional:      Appearance: Normal appearance.  HENT:     Head: Normocephalic and atraumatic.     Mouth/Throat:     Mouth: Mucous membranes are moist.  Eyes:     Extraocular Movements: Extraocular movements intact.  Cardiovascular:     Rate and Rhythm: Normal rate and regular rhythm.  Pulmonary:     Effort: Pulmonary effort is normal. No respiratory distress.     Breath sounds: Normal breath sounds. No wheezing.  Abdominal:     General: Bowel sounds are normal. There is no distension.     Palpations: Abdomen is soft.     Tenderness: There is no abdominal tenderness.  Musculoskeletal:        General: Normal range of motion.     Cervical back: Normal range of motion and neck supple.  Skin:    General: Skin is warm and dry.  Neurological:     General: No focal deficit present.     Mental Status: She is alert.  Psychiatric:        Mood and Affect: Mood normal.      The results of significant diagnostics from this hospitalization (including imaging, microbiology, ancillary and laboratory) are listed below for reference.   Microbiology: Recent Results (from the past 240 hour(s))  Urine Culture     Status: Abnormal   Collection Time: 12/15/22  1:11 PM   Specimen: Urine, Clean Catch  Result Value Ref Range Status   Specimen Description   Final    URINE, CLEAN CATCH Performed at [facility], [address] [place], [place] [address]    Special Requests   Final    NONE Performed at [facility], [address] [place], [place] [address]    Culture >=100,000 COLONIES/mL ESCHERICHIA COLI (A)  Final   Report Status 12/17/2022 FINAL  Final   Organism ID, Bacteria ESCHERICHIA COLI (A)  Final      Susceptibility   Escherichia coli - MIC*    CEFAZOLIN <=4 SENSITIVE Sensitive     CEFEPIME <=0.12 SENSITIVE Sensitive     CEFTRIAXONE <=0.25 SENSITIVE Sensitive  CIPROFLOXACIN <=0.25 SENSITIVE Sensitive     GENTAMICIN <=1 SENSITIVE Sensitive     IMIPENEM <=0.25 SENSITIVE Sensitive     NITROFURANTOIN <=16 SENSITIVE Sensitive     TRIMETH/SULFA <=20 SENSITIVE Sensitive     PIP/TAZO <=4 SENSITIVE Sensitive ug/mL    * >=100,000 COLONIES/mL ESCHERICHIA COLI     Labs: BNP (last 3 results) No results for input(s): "BNP" in the last 8760 hours. Basic Metabolic Panel: Recent Labs  Lab 12/15/22 0932 12/15/22 2120 12/16/22 0504 12/17/22 0451  NA 141 141 140 137  K 3.5 3.2* 2.8* 4.8  CL 106 115* 113* 112*  CO2 22 17* 19* 20*  GLUCOSE 114* 98 96 87  BUN 11 15 16 15   CREATININE 1.87* 1.52* 1.25* 0.89  CALCIUM 9.5 8.1* 7.9* 8.3*  MG  --  1.2* 1.1* 2.4  PHOS  --  3.6  --   --    Liver Function Tests: Recent Labs  Lab 12/15/22 0932 12/17/22 0451  AST 43* 35  ALT 23 21  ALKPHOS 87 67  BILITOT 1.4* 0.6  PROT 8.5* 5.9*  ALBUMIN 4.9 3.3*   Recent Labs  Lab 12/15/22 0932  LIPASE 36   No results for input(s): "AMMONIA" in the last 168 hours. CBC: Recent Labs  Lab 12/15/22 0932 12/16/22 0504  WBC 13.5* 7.2  NEUTROABS 9.5* 4.0  HGB 13.9 10.0*  HCT 39.2 29.8*  MCV 107.4* 115.5*  PLT 278 186   Cardiac Enzymes: No results for input(s): "CKTOTAL", "CKMB", "CKMBINDEX", "TROPONINI" in the last 168 hours. BNP: Invalid input(s): "POCBNP" CBG: No results for input(s): "GLUCAP" in the last 168 hours. D-Dimer No results for input(s): "DDIMER" in the  last 72 hours. Hgb A1c No results for input(s): "HGBA1C" in the last 72 hours. Lipid Profile No results for input(s): "CHOL", "HDL", "LDLCALC", "TRIG", "CHOLHDL", "LDLDIRECT" in the last 72 hours. Thyroid function studies No results for input(s): "TSH", "T4TOTAL", "T3FREE", "THYROIDAB" in the last 72 hours.  Invalid input(s): "FREET3" Anemia work up No results for input(s): "VITAMINB12", "FOLATE", "FERRITIN", "TIBC", "IRON", "RETICCTPCT" in the last 72 hours. Urinalysis    Component Value Date/Time   COLORURINE YELLOW 12/15/2022 0932   APPEARANCEUR CLOUDY (A) 12/15/2022 0932   LABSPEC 1.029 12/15/2022 0932   PHURINE 5.5 12/15/2022 0932   GLUCOSEU NEGATIVE 12/15/2022 0932   HGBUR SMALL (A) 12/15/2022 0932   BILIRUBINUR SMALL (A) 12/15/2022 0932   BILIRUBINUR negative 06/11/2015 1100   KETONESUR 15 (A) 12/15/2022 0932   PROTEINUR 100 (A) 12/15/2022 0932   UROBILINOGEN 0.2 01/28/2016 0942   NITRITE NEGATIVE 12/15/2022 0932   LEUKOCYTESUR MODERATE (A) 12/15/2022 0932   Sepsis Labs Recent Labs  Lab 12/15/22 0932 12/16/22 0504  WBC 13.5* 7.2   Microbiology Recent Results (from the past 240 hour(s))  Urine Culture     Status: Abnormal   Collection Time: 12/15/22  1:11 PM   Specimen: Urine, Clean Catch  Result Value Ref Range Status   Specimen Description   Final    URINE, CLEAN CATCH Performed at [facility], [address] [place], [place] [address]    Special Requests   Final    NONE Performed at [facility], [address] [place], [place] [address]    Culture >=100,000 COLONIES/mL ESCHERICHIA COLI (A)  Final   Report Status 12/17/2022 FINAL  Final   Organism ID, Bacteria ESCHERICHIA COLI (A)  Final      Susceptibility   Escherichia coli - MIC*    CEFAZOLIN <=4  SENSITIVE Sensitive     CEFEPIME <=0.12 SENSITIVE Sensitive     CEFTRIAXONE <=0.25 SENSITIVE Sensitive     CIPROFLOXACIN <=0.25 SENSITIVE Sensitive      GENTAMICIN <=1 SENSITIVE Sensitive     IMIPENEM <=0.25 SENSITIVE Sensitive     NITROFURANTOIN <=16 SENSITIVE Sensitive     TRIMETH/SULFA <=20 SENSITIVE Sensitive     PIP/TAZO <=4 SENSITIVE Sensitive ug/mL    * >=100,000 COLONIES/mL ESCHERICHIA COLI    Procedures/Studies: CT ABDOMEN PELVIS WO CONTRAST  Result Date: 12/15/2022 CLINICAL DATA:  Abdominal pain and diarrhea. History of tubal ligation, hiatal hernia repair and cholecystectomy. Nausea and vomiting. EXAM: CT ABDOMEN AND PELVIS WITHOUT CONTRAST TECHNIQUE: Multidetector CT imaging of the abdomen and pelvis was performed following the standard protocol without IV contrast. RADIATION DOSE REDUCTION: This exam was performed according to the departmental dose-optimization program which includes automated exposure control, adjustment of the mA and/or kV according to patient size and/or use of iterative reconstruction technique. COMPARISON:  Ultrasound 09/11/2022.  CT scan 06/18/2022 and older FINDINGS: Lower chest: Mild linear opacity lung bases likely scar or atelectasis. No pleural effusion. Coronary artery calcifications are seen. Please correlate for other coronary risk factors. Small hiatal hernia. There are some adjacent surgical changes as well. Appearance is similar to previous. Hepatobiliary: On this non IV contrast exam, the hepatic parenchyma is grossly preserved. Previous cholecystectomy. Pancreas: Mild pancreatic atrophy. No mass lesion, fluid collection or adjacent inflammatory stranding at this time. No CT sequela of pancreatitis. Spleen: Normal in size without focal abnormality. Adrenals/Urinary Tract: Adrenal glands are unremarkable. Kidneys are normal, without renal calculi, focal lesion, or hydronephrosis. Bladder is underdistended. Stomach/Bowel: On this non oral contrast exam there is scattered fluid-filled loops of small and large bowel. There are some air and fluid in the stomach. No obstruction or dilatation. Normal appendix in  the right lower quadrant. There are some areas of fat along the wall of the ascending colon, unchanged from previous. Of note there is a loop of colon extending between the liver in the abdominal wall. Vascular/Lymphatic: Aortic atherosclerosis. No enlarged abdominal or pelvic lymph nodes. Reproductive: Uterus and bilateral adnexa are unremarkable. Other: Small amount of free fluid dependently in the pelvis with some mesenteric haziness of uncertain etiology. No free intra-abdominal air. Small umbilical hernia containing fat and some fluid. Musculoskeletal: Severe levoconvex curvature of the mid lumbar spine with degenerative changes and rotatory component. IMPRESSION: No bowel obstruction, free air. There is some mesenteric stranding and some simple fluid in the dependent pelvis of uncertain etiology and significance. Please correlate with clinical findings. Follow up may be of some benefit. There is fluid-filled loops of bowel diffusely. Surgical changes from hiatal hernia repair and cholecystectomy. Small hiatal hernia suggested. Coronary artery calcifications. Please correlate for other coronary risk factors. Electronically Signed   By: Karen Kays M.D.   On: 12/15/2022 13:29     Time coordinating discharge: Over 30 minutes    Toni Chamber, MD  [facility] 12/17/2022, 2:16 PM

## 2022-12-17 NOTE — Progress Notes (Signed)
Mobility Specialist - Progress Note   12/17/22 0953  Mobility  Activity Ambulated with assistance in hallway  Level of Assistance Modified independent, requires aide device or extra time  Assistive Device Four wheel walker  Distance Ambulated (ft) 600 ft  Activity Response Tolerated well  Mobility Referral Yes  $Mobility charge 1 Mobility  Mobility Specialist Start Time (ACUTE ONLY) [id number]  Mobility Specialist Stop Time (ACUTE ONLY) [id number]  Mobility Specialist Time Calculation (min) (ACUTE ONLY) 24 min   Pt received in bed and agreeable to mobility. No complaints during session. Pt to bed after session with all needs met.    [facility]

## 2022-12-17 NOTE — Care Management Obs Status (Signed)
MEDICARE OBSERVATION STATUS NOTIFICATION   Patient Details  Name: Toni Gutierrez MRN: [id number] Date of Birth: 1961-01-24   Medicare Observation Status Notification Given:  Yes    Ewing Schlein, LCSW 12/17/2022, 11:26 AM

## 2022-12-17 NOTE — Progress Notes (Signed)
Reviewed written discharge instructions with patient. All questions answered. Patient verbalized understanding. Discharged via wheelchair with all belongings... in stable condition. 

## 2022-12-18 LAB — URINE CULTURE: Culture: >=100000 — AB

## 2022-12-18 LAB — NOROVIRUS GROUP 1 & 2 BY PCR, STOOL
Norovirus 1 by PCR: NEGATIVE
Norovirus 2  by PCR: NEGATIVE

## 2023-07-27 ENCOUNTER — Other Ambulatory Visit: Payer: Self-pay | Admitting: Nurse Practitioner

## 2023-07-27 DIAGNOSIS — R748 Abnormal levels of other serum enzymes: Secondary | ICD-10-CM

## 2023-08-04 ENCOUNTER — Ambulatory Visit
Admission: RE | Admit: 2023-08-04 | Discharge: 2023-08-04 | Disposition: A | Discharge status: Home / Self Care | Source: Ambulatory Visit | Attending: Nurse Practitioner | Admitting: Nurse Practitioner

## 2023-08-04 DIAGNOSIS — R748 Abnormal levels of other serum enzymes: Secondary | ICD-10-CM
